# Patient Record
Sex: Female | Born: 1960
Health system: Southern US, Community
[De-identification: ages and names within clinical notes are randomized; demographics above are authoritative.]

## PROBLEM LIST (undated history)

## (undated) ENCOUNTER — Ambulatory Visit (HOSPITAL_COMMUNITY): Admission: EM | Payer: Medicaid Other | Source: Home / Self Care

## (undated) DIAGNOSIS — E7800 Pure hypercholesterolemia, unspecified: Secondary | ICD-10-CM

## (undated) DIAGNOSIS — R7303 Prediabetes: Secondary | ICD-10-CM

## (undated) DIAGNOSIS — K219 Gastro-esophageal reflux disease without esophagitis: Secondary | ICD-10-CM

## (undated) DIAGNOSIS — N62 Hypertrophy of breast: Secondary | ICD-10-CM

## (undated) DIAGNOSIS — I10 Essential (primary) hypertension: Secondary | ICD-10-CM

## (undated) DIAGNOSIS — S83249A Other tear of medial meniscus, current injury, unspecified knee, initial encounter: Secondary | ICD-10-CM

## (undated) DIAGNOSIS — M25549 Pain in joints of unspecified hand: Secondary | ICD-10-CM

## (undated) DIAGNOSIS — N898 Other specified noninflammatory disorders of vagina: Secondary | ICD-10-CM

## (undated) HISTORY — DX: Other specified noninflammatory disorders of vagina: N89.8

## (undated) HISTORY — PX: BREAST LUMPECTOMY: SHX2

## (undated) HISTORY — PX: VAGINAL HYSTERECTOMY: SUR661

## (undated) HISTORY — DX: Pain in joints of unspecified hand: M25.549

## (undated) HISTORY — DX: Pure hypercholesterolemia, unspecified: E78.00

## (undated) HISTORY — DX: Essential (primary) hypertension: I10

## (undated) SURGERY — COLONOSCOPY
Anesthesia: Monitor Anesthesia Care

---

## 2003-05-30 ENCOUNTER — Emergency Department (HOSPITAL_COMMUNITY): Admission: EM | Admit: 2003-05-30 | Discharge: 2003-05-30 | Payer: Self-pay | Admitting: Emergency Medicine

## 2003-11-13 ENCOUNTER — Emergency Department (HOSPITAL_COMMUNITY): Admission: EM | Admit: 2003-11-13 | Discharge: 2003-11-13 | Payer: Self-pay | Admitting: Emergency Medicine

## 2004-09-12 ENCOUNTER — Emergency Department (HOSPITAL_COMMUNITY): Admission: EM | Admit: 2004-09-12 | Discharge: 2004-09-13 | Payer: Self-pay | Admitting: Emergency Medicine

## 2004-10-05 ENCOUNTER — Ambulatory Visit: Payer: Self-pay | Admitting: Family Medicine

## 2004-10-13 ENCOUNTER — Ambulatory Visit: Payer: Self-pay | Admitting: Family Medicine

## 2004-10-17 ENCOUNTER — Ambulatory Visit: Payer: Self-pay | Admitting: *Deleted

## 2004-10-19 ENCOUNTER — Ambulatory Visit: Payer: Self-pay | Admitting: Family Medicine

## 2005-03-06 ENCOUNTER — Ambulatory Visit: Payer: Self-pay | Admitting: Family Medicine

## 2005-05-22 ENCOUNTER — Ambulatory Visit: Payer: Self-pay | Admitting: Family Medicine

## 2005-08-03 ENCOUNTER — Emergency Department (HOSPITAL_COMMUNITY): Admission: EM | Admit: 2005-08-03 | Discharge: 2005-08-03 | Payer: Self-pay | Admitting: Family Medicine

## 2005-08-09 ENCOUNTER — Emergency Department (HOSPITAL_COMMUNITY): Admission: EM | Admit: 2005-08-09 | Discharge: 2005-08-09 | Payer: Self-pay | Admitting: Family Medicine

## 2005-10-12 ENCOUNTER — Emergency Department (HOSPITAL_COMMUNITY): Admission: EM | Admit: 2005-10-12 | Discharge: 2005-10-12 | Payer: Self-pay | Admitting: Family Medicine

## 2005-11-25 ENCOUNTER — Emergency Department (HOSPITAL_COMMUNITY): Admission: EM | Admit: 2005-11-25 | Discharge: 2005-11-25 | Payer: Self-pay | Admitting: Family Medicine

## 2005-12-18 ENCOUNTER — Emergency Department (HOSPITAL_COMMUNITY): Admission: EM | Admit: 2005-12-18 | Discharge: 2005-12-18 | Payer: Self-pay | Admitting: Family Medicine

## 2006-02-01 ENCOUNTER — Emergency Department (HOSPITAL_COMMUNITY): Admission: EM | Admit: 2006-02-01 | Discharge: 2006-02-02 | Payer: Self-pay | Admitting: *Deleted

## 2006-02-04 ENCOUNTER — Inpatient Hospital Stay (HOSPITAL_COMMUNITY): Admission: AD | Admit: 2006-02-04 | Discharge: 2006-02-04 | Payer: Self-pay | Admitting: Obstetrics and Gynecology

## 2006-02-08 ENCOUNTER — Ambulatory Visit: Payer: Self-pay | Admitting: *Deleted

## 2006-03-08 ENCOUNTER — Ambulatory Visit (HOSPITAL_COMMUNITY): Admission: RE | Admit: 2006-03-08 | Discharge: 2006-03-08 | Payer: Self-pay | Admitting: Gastroenterology

## 2006-03-08 ENCOUNTER — Encounter (INDEPENDENT_AMBULATORY_CARE_PROVIDER_SITE_OTHER): Payer: Self-pay | Admitting: *Deleted

## 2007-09-02 ENCOUNTER — Encounter: Payer: Self-pay | Admitting: Internal Medicine

## 2008-02-06 DIAGNOSIS — E7800 Pure hypercholesterolemia, unspecified: Secondary | ICD-10-CM | POA: Insufficient documentation

## 2008-11-05 ENCOUNTER — Ambulatory Visit (HOSPITAL_COMMUNITY): Admission: RE | Admit: 2008-11-05 | Discharge: 2008-11-06 | Payer: Self-pay | Admitting: Obstetrics and Gynecology

## 2008-11-05 ENCOUNTER — Encounter (INDEPENDENT_AMBULATORY_CARE_PROVIDER_SITE_OTHER): Payer: Self-pay | Admitting: Obstetrics and Gynecology

## 2009-01-04 ENCOUNTER — Ambulatory Visit: Payer: Self-pay | Admitting: Family Medicine

## 2009-01-04 ENCOUNTER — Encounter (INDEPENDENT_AMBULATORY_CARE_PROVIDER_SITE_OTHER): Payer: Self-pay | Admitting: Nurse Practitioner

## 2009-01-04 DIAGNOSIS — D259 Leiomyoma of uterus, unspecified: Secondary | ICD-10-CM | POA: Insufficient documentation

## 2009-01-04 DIAGNOSIS — I10 Essential (primary) hypertension: Secondary | ICD-10-CM | POA: Insufficient documentation

## 2009-01-04 LAB — CONVERTED CEMR LAB
ALT: 18 units/L (ref 0–35)
Alkaline Phosphatase: 74 units/L (ref 39–117)
BUN: 20 mg/dL (ref 6–23)
Bilirubin Urine: NEGATIVE
Chloride: 103 meq/L (ref 96–112)
Eosinophils Relative: 2 % (ref 0–5)
Glucose, Urine, Semiquant: NEGATIVE
HCT: 42 % (ref 36.0–46.0)
Hemoglobin: 12.6 g/dL (ref 12.0–15.0)
Lymphocytes Relative: 26 % (ref 12–46)
Lymphs Abs: 1.9 10*3/uL (ref 0.7–4.0)
Monocytes Absolute: 0.4 10*3/uL (ref 0.1–1.0)
Monocytes Relative: 6 % (ref 3–12)
Platelets: 274 10*3/uL (ref 150–400)
Potassium: 3.5 meq/L (ref 3.5–5.3)
RBC: 4.61 M/uL (ref 3.87–5.11)
TSH: 0.742 microintl units/mL (ref 0.350–4.500)
Total Bilirubin: 0.3 mg/dL (ref 0.3–1.2)
Total Protein: 7.9 g/dL (ref 6.0–8.3)
WBC: 7.3 10*3/uL (ref 4.0–10.5)
pH: 6

## 2009-01-14 ENCOUNTER — Encounter (INDEPENDENT_AMBULATORY_CARE_PROVIDER_SITE_OTHER): Payer: Self-pay | Admitting: Nurse Practitioner

## 2009-02-02 ENCOUNTER — Ambulatory Visit: Payer: Self-pay | Admitting: Nurse Practitioner

## 2009-02-02 DIAGNOSIS — E669 Obesity, unspecified: Secondary | ICD-10-CM | POA: Insufficient documentation

## 2009-02-03 ENCOUNTER — Encounter (INDEPENDENT_AMBULATORY_CARE_PROVIDER_SITE_OTHER): Payer: Self-pay | Admitting: Nurse Practitioner

## 2009-02-11 ENCOUNTER — Ambulatory Visit: Payer: Self-pay | Admitting: Nurse Practitioner

## 2009-02-17 ENCOUNTER — Encounter (INDEPENDENT_AMBULATORY_CARE_PROVIDER_SITE_OTHER): Payer: Self-pay | Admitting: Nurse Practitioner

## 2009-02-17 DIAGNOSIS — I517 Cardiomegaly: Secondary | ICD-10-CM | POA: Insufficient documentation

## 2009-02-21 ENCOUNTER — Encounter (INDEPENDENT_AMBULATORY_CARE_PROVIDER_SITE_OTHER): Payer: Self-pay | Admitting: Nurse Practitioner

## 2009-03-11 ENCOUNTER — Telehealth (INDEPENDENT_AMBULATORY_CARE_PROVIDER_SITE_OTHER): Payer: Self-pay | Admitting: Nurse Practitioner

## 2009-03-18 ENCOUNTER — Telehealth (INDEPENDENT_AMBULATORY_CARE_PROVIDER_SITE_OTHER): Payer: Self-pay | Admitting: *Deleted

## 2009-04-04 ENCOUNTER — Ambulatory Visit: Payer: Self-pay | Admitting: Nurse Practitioner

## 2009-04-06 ENCOUNTER — Encounter (INDEPENDENT_AMBULATORY_CARE_PROVIDER_SITE_OTHER): Payer: Self-pay | Admitting: Nurse Practitioner

## 2009-04-06 LAB — CONVERTED CEMR LAB
LDL Cholesterol: 177 mg/dL — ABNORMAL HIGH (ref 0–99)
Triglycerides: 64 mg/dL (ref <150)
VLDL: 13 mg/dL (ref 0–40)

## 2009-04-20 ENCOUNTER — Emergency Department (HOSPITAL_COMMUNITY): Admission: EM | Admit: 2009-04-20 | Discharge: 2009-04-20 | Payer: Self-pay | Admitting: Emergency Medicine

## 2009-04-21 ENCOUNTER — Ambulatory Visit: Payer: Self-pay | Admitting: Nurse Practitioner

## 2009-04-21 DIAGNOSIS — M549 Dorsalgia, unspecified: Secondary | ICD-10-CM | POA: Insufficient documentation

## 2009-04-21 DIAGNOSIS — H1130 Conjunctival hemorrhage, unspecified eye: Secondary | ICD-10-CM | POA: Insufficient documentation

## 2009-04-21 LAB — CONVERTED CEMR LAB
HDL goal, serum: 40 mg/dL
LDL Goal: 160 mg/dL

## 2009-06-29 ENCOUNTER — Ambulatory Visit: Payer: Self-pay | Admitting: Nurse Practitioner

## 2009-06-29 DIAGNOSIS — B379 Candidiasis, unspecified: Secondary | ICD-10-CM | POA: Insufficient documentation

## 2009-06-29 LAB — CONVERTED CEMR LAB

## 2009-07-04 ENCOUNTER — Encounter (INDEPENDENT_AMBULATORY_CARE_PROVIDER_SITE_OTHER): Payer: Self-pay | Admitting: Nurse Practitioner

## 2009-07-04 LAB — CONVERTED CEMR LAB
Chlamydia, Swab/Urine, PCR: NEGATIVE
GC Probe Amp, Urine: NEGATIVE
HDL: 60 mg/dL (ref >39)
LDL Cholesterol: 175 mg/dL — ABNORMAL HIGH (ref 0–99)

## 2009-10-04 ENCOUNTER — Telehealth (INDEPENDENT_AMBULATORY_CARE_PROVIDER_SITE_OTHER): Payer: Self-pay | Admitting: Nurse Practitioner

## 2009-10-04 DIAGNOSIS — N760 Acute vaginitis: Secondary | ICD-10-CM | POA: Insufficient documentation

## 2009-10-13 ENCOUNTER — Encounter (INDEPENDENT_AMBULATORY_CARE_PROVIDER_SITE_OTHER): Payer: Self-pay | Admitting: Nurse Practitioner

## 2009-12-07 ENCOUNTER — Ambulatory Visit: Payer: Self-pay | Admitting: Obstetrics and Gynecology

## 2009-12-09 ENCOUNTER — Encounter: Payer: Self-pay | Admitting: Obstetrics and Gynecology

## 2009-12-09 LAB — CONVERTED CEMR LAB
Trich, Wet Prep: NONE SEEN
Yeast Wet Prep HPF POC: NONE SEEN

## 2010-04-27 ENCOUNTER — Ambulatory Visit: Payer: Self-pay | Admitting: Nurse Practitioner

## 2010-04-27 ENCOUNTER — Telehealth (INDEPENDENT_AMBULATORY_CARE_PROVIDER_SITE_OTHER): Payer: Self-pay | Admitting: Nurse Practitioner

## 2010-04-27 DIAGNOSIS — M25549 Pain in joints of unspecified hand: Secondary | ICD-10-CM | POA: Insufficient documentation

## 2010-05-11 ENCOUNTER — Telehealth (INDEPENDENT_AMBULATORY_CARE_PROVIDER_SITE_OTHER): Payer: Self-pay | Admitting: Nurse Practitioner

## 2010-05-12 ENCOUNTER — Ambulatory Visit (HOSPITAL_COMMUNITY): Admission: RE | Admit: 2010-05-12 | Discharge: 2010-05-12 | Payer: Self-pay | Admitting: Internal Medicine

## 2010-05-12 ENCOUNTER — Telehealth (INDEPENDENT_AMBULATORY_CARE_PROVIDER_SITE_OTHER): Payer: Self-pay | Admitting: Nurse Practitioner

## 2010-05-24 ENCOUNTER — Encounter (INDEPENDENT_AMBULATORY_CARE_PROVIDER_SITE_OTHER): Payer: Self-pay | Admitting: Nurse Practitioner

## 2010-11-02 ENCOUNTER — Telehealth (INDEPENDENT_AMBULATORY_CARE_PROVIDER_SITE_OTHER): Payer: Self-pay | Admitting: Nurse Practitioner

## 2010-11-05 LAB — CONVERTED CEMR LAB
Cholesterol: 242 mg/dL — ABNORMAL HIGH (ref 0–200)
HDL: 51 mg/dL (ref >39)
Total CHOL/HDL Ratio: 4.7
VLDL: 22 mg/dL (ref 0–40)

## 2010-11-07 NOTE — Letter (Signed)
Summary: *Referral Letter  [facility]  [address] [place], [place] [address]   Phone: [phone]  Fax: [phone]    10/13/2009  Thank you in advance for agreeing to see my patient:  Tara Holland [address] [place], [place]  [address]  Phone: [phone]  Reason for Referral: Recurrent vaginal itching and irritation   Current Medical Problems: 1)  UNSPECIFIED VAGINITIS AND VULVOVAGINITIS (ICD-616.10) 2)  CANDIDIASIS (ICD-112.9) 3)  BACK PAIN (ICD-724.5) 4)  HYPERCHOLESTEROLEMIA (ICD-272.0) 5)  VENTRICULAR HYPERTROPHY, LEFT (ICD-429.3) 6)  OBESITY (ICD-278.00) 7)  HYPERTENSION, BENIGN ESSENTIAL (ICD-401.1) 8)  Hx of FIBROIDS, UTERUS (ICD-218.9)   Current Medications: 1)  AMLODIPINE BESYLATE 10 MG TABS (AMLODIPINE BESYLATE) One tablet by mouth daily for blood pressure 2)  HYDROCHLOROTHIAZIDE 25 MG TABS (HYDROCHLOROTHIAZIDE) One tablet by mouth daily for blood pressure 3)  CALTRATE 600+D PLUS 600-400 MG-UNIT TABS (CALCIUM CARBONATE-VIT D-MIN)  4)  HYDROCORTISONE 2.5 % CREA (HYDROCORTISONE) One application topically two times a day as needed for itching 5)  SIMVASTATIN 20 MG TABS (SIMVASTATIN) One tablet by mouth nightly for cholesterol **Pharmacy - discontinue pravachol**    Thank you again for agreeing to see our patient; please contact us if you have any further questions or need additional information.  Sincerely,    [facility] Lehman Prom FNP

## 2010-11-07 NOTE — Progress Notes (Signed)
Summary: Additional Questions  Phone Note Call from Patient   Summary of Call: pt says she wants to do labs to check for blockage because she has pain in left arm and she wants to make sure she doesnt have any blockage in her arm plus she wants to start [employer] program and she wants to make sure she is okay...Marland KitchenMarland KitchenMarland Kitchen pt says it is time for mammogram Initial call taken by: Armenia Shannon,  April 27, 2010 2:38 PM  Follow-up for Phone Call        Mammogram ordered unsure what pt is asking regarding ? blockage test There is no test to screen for blockages - just preventative measures such as controlling cholesterol and blood pressure (for those that have it), stop smoking, exercise is good - start slow and increase over time. Of note, her cholesterol was high when done in 2010 and pt is likely NOT taking medication so this increases her risk. Follow-up by: Lehman Prom FNP,  April 27, 2010 3:20 PM  Additional Follow-up for Phone Call Additional follow up Details #1::        pt informed of above information. Additional Follow-up by: Levon Hedger,  April 27, 2010 4:14 PM  New Problems: OTHER SCREENING BREAST EXAMINATION (ICD-V76.19)   New Problems: OTHER SCREENING BREAST EXAMINATION (ICD-V76.19)

## 2010-11-07 NOTE — Letter (Signed)
Summary: TEST ORDER FROM//[facility]GRAM  TEST ORDER FROM//[facility]GRAM   Imported By: Arta Bruce 05/04/2010 14:26:58  _____________________________________________________________________  External Attachment:    Type:   Image     Comment:   External Document

## 2010-11-07 NOTE — Progress Notes (Signed)
Summary: do we have hand braces  Phone Note Call from Patient Call back at Home Phone [phone]   Complaint: Urinary/GYN Problems Summary of Call: Tara Holland wants to know if we have any hand braces, because the wrist splint isn't helping. she says that she cant afford to buy one. Initial call taken by: Leodis Rains,  May 11, 2010 8:52 AM  Follow-up for Phone Call        forward to N. Daphine Deutscher, fnp Follow-up by: Levon Hedger,  May 12, 2010 10:31 AM  Additional Follow-up for Phone Call Additional follow up Details #1::        unfortunately no, this office does not have any wrist splints. I looked when she was here during office visit.  To my knowledge we are not ordering any more so we will not have any in the future either Additional Follow-up by: Lehman Prom FNP,  May 12, 2010 10:45 AM    Additional Follow-up for Phone Call Additional follow up Details #2::    SPOKE WITH PATIENT AND SHE IS AWARE THAT WE DON'T HAVE ANY HAND BRACES. Follow-up by: Leodis Rains,  May 12, 2010 12:01 PM

## 2010-11-07 NOTE — Miscellaneous (Signed)
Summary: Colonscopy Order  Clinical Lists Changes  Problems: Added new problem of SCREENING, COLON CANCER (ICD-V76.51) Orders: Added new Test order of Colonoscopy (Colon) - Signed  Appended Document: Colonscopy Order    Clinical Lists Changes  Medications: Added new medication of MIRALAX  POWD (POLYETHYLENE GLYCOL 3350) Mix  8.3oz of powder with 64 ounces of water/juice for colonscopy prep - Signed Rx of MIRALAX  POWD (POLYETHYLENE GLYCOL 3350) Mix  8.3oz of powder with 64 ounces of water/juice for colonscopy prep;  #8.3oz x o;  Signed;  Entered by: Lehman Prom FNP;  Authorized by: Lehman Prom FNP;  Method used: Printed then faxed to [facility] [address], [place], [place], [place]  [address], Ph: [phone], Fax: [phone]    Prescriptions: MIRALAX  POWD (POLYETHYLENE GLYCOL 3350) Mix  8.3oz of powder with 64 ounces of water/juice for colonscopy prep  #8.3oz x o   Entered and Authorized by:   Lehman Prom FNP   Signed by:   Lehman Prom FNP on 06/27/2010   Method used:   Printed then faxed to ...       [facility] ([facility]ail)       [address]       [place], [place]  [address]       Ph: [phone]       Fax: [phone]   RxID:   [id number]

## 2010-11-07 NOTE — Assessment & Plan Note (Signed)
Summary: Pain in joint   Vital Signs:  Patient profile:   50 year old female Menstrual status:  hysterectomy Height:      68 inches Weight:      190.0 pounds BMI:     28.99 BSA:     2.00 Temp:     97.8 degrees F oral Pulse rate:   62 / minute Pulse rhythm:   regular Resp:     16 per minute BP sitting:   144 / 85  (left arm) Cuff size:   regular  Vitals Entered By: Levon Hedger (April 27, 2010 11:21 AM)  Nutrition Counseling: Patient's BMI is greater than 25 and therefore counseled on weight management options.  History of Present Illness:  Pt into the office for f/u with achy joints  Vaginal irritation - pt did go to GYN as per her request as she continued with vaginal discharge and some itching s/p her hysterectomy.  She was prescribed some cream but she did not get as ordered because she was not given a definate dx.  S/p hysterecomy about 1 year ago by [employer] ? that she is still having some tenderness and burning in the areas where   Hypertension History:      She denies headache, chest pain, and palpitations.  She notes no problems with any antihypertensive medication side effects.  Pt has only been taking amlodipine as she has to get HCTZ from [facility]. The Rx has been sent but pt has not gone to pick up the rx.  Admits that BP is improved when she takes both meds.        Positive major cardiovascular risk factors include hyperlipidemia and hypertension.  Negative major cardiovascular risk factors include female age less than 69 years old, no history of diabetes, and non-tobacco-user status.        Further assessment for target organ damage reveals no history of ASHD, cardiac end-organ damage (CHF/LVH), stroke/TIA, peripheral vascular disease, renal insufficiency, or hypertensive retinopathy.     Allergies: 1)  ! Sulfa  Review of Systems Resp:  Denies cough. GI:  Complains of change in bowel habits and constipation; denies abdominal pain, nausea, and vomiting; Last  colonscopy done about 3 years ago at which time pt reports some removal of polyps. Reports that stool is ribbon-like, Quanisha Drewry in color - no blood noted in stools.. MS:  Complains of joint pain; hands - stiffness especially in the morning.  Physical Exam  General:  alert.   Head:  normocephalic.   Lungs:  normal breath sounds.   Heart:  normal rate and regular rhythm.   Abdomen:  normal bowel sounds.   Neurologic:  alert & oriented X3.     Wrist/Hand Exam  Hand Exam:    Right:    Inspection:  Normal    Palpation:  Normal    Tenderness:  5th metacarpal    Swelling:  no    Erythema:  no   Impression & Recommendations:  Problem # 1:  JOINT PAIN, HAND (ICD-719.44)  ace wrap applied to hand advised pt that she likely has left thumb tendinitis she will benefit more from wrist splint  Orders: Ace  Bandage < 3in. [phone])  Problem # 2:  HYPERTENSION, BENIGN ESSENTIAL (ICD-401.1) bp elevated today but pt has not taken her HCTZ advised her to take meds daily Her updated medication list for this problem includes:    Amlodipine Besylate 10 Mg Tabs (Amlodipine besylate) ..... One tablet by mouth daily for blood pressure  Hydrochlorothiazide 25 Mg Tabs (Hydrochlorothiazide) ..... One tablet by mouth daily for blood pressure  Problem # 3:  HYPERCHOLESTEROLEMIA (ICD-272.0) lipids elevated on last visit pt is not taking meds as ordered advised of need to do so to decrease risk of heart attack/stroke Her updated medication list for this problem includes:    Simvastatin 20 Mg Tabs (Simvastatin) ..... One tablet by mouth nightly for cholesterol **pharmacy - discontinue pravachol**  Complete Medication List: 1)  Amlodipine Besylate 10 Mg Tabs (Amlodipine besylate) .... One tablet by mouth daily for blood pressure 2)  Hydrochlorothiazide 25 Mg Tabs (Hydrochlorothiazide) .... One tablet by mouth daily for blood pressure 3)  Caltrate 600+d Plus 600-400 Mg-unit Tabs (Calcium carbonate-vit  d-min) 4)  Hydrocortisone 2.5 % Crea (Hydrocortisone) .... One application topically two times a day as needed for itching 5)  Simvastatin 20 Mg Tabs (Simvastatin) .... One tablet by mouth nightly for cholesterol **pharmacy - discontinue pravachol**  Hypertension Assessment/Plan:      The patient's hypertensive risk group is category B: At least one risk factor (excluding diabetes) with no target organ damage.  Her calculated 10 year risk of coronary heart disease is 9 %.  Today's blood pressure is 144/85.  Her blood pressure goal is < 140/90.  Patient Instructions: 1)  Left thumb - likely tendinits from over use 2)  You will need a left small/medium wrist splint 3)  Ideally you would wear during the day but if you must work then wear it at night for at least 2 straight weeks. 4)  Take aleve - 2 tablets by mouth for three days after breakfast then as needed  5)  Bowel habits - be sure to add more fiber in your diet by eating Raisins, apples with peelings, oatmeal, raisin bran and drink plenty of water.   Colonscopy - unable to get referral at this time with [facility].  If opportunities open up this office can refer otherwise it will be an out of pocket expense 6)  Follow up as needed

## 2010-11-07 NOTE — Progress Notes (Signed)
Summary: REFILL ON BP MEDICATION  Phone Note Call from Patient Call back at Home Phone [phone]   Reason for Call: Refill Medication Summary of Call: [name]. MS Spayd SAYS SHE NEEDS A REFILL ON HER AMLODOPINE SENT TO [facility] [address]. Initial call taken by: Leodis Rains,  May 12, 2010 12:07 PM  Follow-up for Phone Call        forward to N. Daphine Deutscher, fnp Follow-up by: Levon Hedger,  May 12, 2010 2:47 PM  Additional Follow-up for Phone Call Additional follow up Details #1::        Rx sent to the pharmacy notify Tara Holland Additional Follow-up by: Lehman Prom FNP,  May 12, 2010 3:07 PM    Additional Follow-up for Phone Call Additional follow up Details #2::    Tara Holland notified.  Follow-up by: Levon Hedger,  May 12, 2010 4:52 PM  Prescriptions: AMLODIPINE BESYLATE 10 MG TABS (AMLODIPINE BESYLATE) One tablet by mouth daily for blood pressure  #30 x 5   Entered and Authorized by:   Lehman Prom FNP   Signed by:   Lehman Prom FNP on 05/12/2010   Method used:   Electronically to        [facility] (retail)       [address]       [place], [place]  [address]       Ph: [phone]       Fax: [phone]   RxID:   [id number]

## 2010-11-09 NOTE — Progress Notes (Signed)
Summary: refill on her HCTZ  Phone Note Call from Patient Call back at Home Phone [phone]   Reason for Call: Refill Medication Summary of Call: Tara Holland Bartmess called because she needs refills on her hctz 25 mg. she [facility], but now she wants it sent to [facility]. Holland Lannom says that she called [facility], and was told by them that they can have it ready for her tomorrow. Initial call taken by: Leodis Rains,  November 02, 2010 5:13 PM  Follow-up for Phone Call        Last seen 04/2010.  Refill HCTZ per protocol? Follow-up by: Dutch Quint RN,  November 02, 2010 5:17 PM  Additional Follow-up for Phone Call Additional follow up Details #1::        yes, ok to refill Additional Follow-up by: Lehman Prom FNP,  November 02, 2010 5:26 PM    Additional Follow-up for Phone Call Additional follow up Details #2::    Refill completed.  Pt. notified.  Dutch Quint RN  November 02, 2010 5:41 PM   Prescriptions: HYDROCHLOROTHIAZIDE 25 MG TABS (HYDROCHLOROTHIAZIDE) One tablet by mouth daily for blood pressure  #30 x 3   Entered by:   Dutch Quint RN   Authorized by:   Lehman Prom FNP   Signed by:   Dutch Quint RN on 11/02/2010   Method used:   Faxed to ...       [facility] (retail)       [address] [place], [place]  [address]       Ph: [phone]       Fax: [phone]   RxID:   [id number]

## 2010-11-14 ENCOUNTER — Encounter (INDEPENDENT_AMBULATORY_CARE_PROVIDER_SITE_OTHER): Payer: Self-pay | Admitting: Nurse Practitioner

## 2010-11-14 ENCOUNTER — Encounter: Payer: Self-pay | Admitting: Nurse Practitioner

## 2010-11-14 DIAGNOSIS — M940 Chondrocostal junction syndrome [Tietze]: Secondary | ICD-10-CM | POA: Insufficient documentation

## 2010-11-14 LAB — CONVERTED CEMR LAB
Nitrite: NEGATIVE
Protein, U semiquant: NEGATIVE
Urobilinogen, UA: 0.2
WBC Urine, dipstick: NEGATIVE

## 2010-11-15 ENCOUNTER — Encounter (INDEPENDENT_AMBULATORY_CARE_PROVIDER_SITE_OTHER): Payer: Self-pay | Admitting: Nurse Practitioner

## 2010-11-15 LAB — CONVERTED CEMR LAB
AST: 31 units/L (ref 0–37)
Alkaline Phosphatase: 76 units/L (ref 39–117)
BUN: 17 mg/dL (ref 6–23)
Basophils Relative: 0 % (ref 0–1)
Eosinophils Absolute: 0.1 10*3/uL (ref 0.0–0.7)
Eosinophils Relative: 2 % (ref 0–5)
Glucose, Bld: 86 mg/dL (ref 70–99)
HCT: 41.6 % (ref 36.0–46.0)
HDL: 62 mg/dL (ref >39)
LDL Cholesterol: 170 mg/dL — ABNORMAL HIGH (ref 0–99)
Lymphs Abs: 2.1 10*3/uL (ref 0.7–4.0)
MCHC: 31.7 g/dL (ref 30.0–36.0)
MCV: 90.4 fL (ref 78.0–100.0)
Neutrophils Relative %: 62 % (ref 43–77)
Platelets: 251 10*3/uL (ref 150–400)
RDW: 14 % (ref 11.5–15.5)
Sodium: 141 meq/L (ref 135–145)
TSH: 0.705 microintl units/mL (ref 0.350–4.500)
Total Bilirubin: 0.5 mg/dL (ref 0.3–1.2)
Total Protein: 7.4 g/dL (ref 6.0–8.3)
Triglycerides: 92 mg/dL (ref <150)
VLDL: 18 mg/dL (ref 0–40)
WBC: 6.9 10*3/uL (ref 4.0–10.5)

## 2010-11-23 NOTE — Letter (Signed)
Summary: Lipid Letter  [facility]  [address] [place], [place] [address]   Phone: [phone]  Fax: [phone]    11/15/2010  Tara Holland [address] [place], [place]  [address]  Dear Tara Holland:  We have carefully reviewed your last lipid profile from 11/14/2010 and the results are noted below with a summary of recommendations for lipid management.    Cholesterol:       250     Goal: less than 200   HDL "good" Cholesterol:   62     Goal: greater than 40   LDL "bad" Cholesterol:   170     Goal: less than 100   Triglycerides:       92     Goal: less than 150     Your cholesterol is still high. You should have spoken with this office about the need to start medication.  You will need your cholesterol and liver labs rechecked after 8 weeks of being on the medication.     Current Medications: 1)    Amlodipine Besylate 10 Mg Tabs (Amlodipine besylate) .... One tablet by mouth daily for blood pressure 2)    Hydrochlorothiazide 25 Mg Tabs (Hydrochlorothiazide) .... One tablet by mouth daily for blood pressure 3)    Caltrate 600+d Plus 600-400 Mg-unit Tabs (Calcium carbonate-vit d-min) 4)    Hydrocortisone 2.5 % Crea (Hydrocortisone) .... One application topically two times a day as needed for itching 5)    Simvastatin 20 Mg Tabs (Simvastatin) .... Hold 6)    Miralax  Powd (Polyethylene glycol 3350) .... Mix  8.3oz of powder with 64 ounces of water/juice for colonscopy prep 7)    Promethazine-codeine 6.25-10 Mg/3ml Syrp (Promethazine-codeine) .... 2 teaspoons at night as needed for cough 8)    Lovastatin 10 Mg Tabs (Lovastatin) .... One tablet by mouth nightly for cholesterol  If you have any questions, please call. We appreciate being able to work with you.   Sincerely,    [facility] Tara Prom FNP

## 2010-11-23 NOTE — Letter (Signed)
Summary: Handout Printed  Printed Handout:  - Costochondritis 

## 2010-11-23 NOTE — Assessment & Plan Note (Signed)
Summary: Costochondritis   Vital Signs:  Patient profile:   50 year old female Menstrual status:  hysterectomy Weight:      196.31 pounds Temp:     97.2 degrees F oral Pulse rate:   76 / minute Pulse rhythm:   regular Resp:     16 per minute BP sitting:   132 / 88  (left arm) Cuff size:   regular  Vitals Entered By: Hale Drone CMA (November 14, 2010 11:44 AM) CC: here for a f/u. Still feeling tightness in chest x2 weeks. Feels like "something is always pressing on chest." Also having lower abd. pain x6 months. Vagina itching for x3 months. Dr. Okey Dupre prescribed nystatin and triamcinoone acetonide cream but not helping., Hypertension Management, Lipid Management Is Patient Diabetic? No Pain Assessment Patient in pain? no       Does patient need assistance? Functional Status Self care Ambulation Normal   CC:  here for a f/u. Still feeling tightness in chest x2 weeks. Feels like "something is always pressing on chest." Also having lower abd. pain x6 months. Vagina itching for x3 months. Dr. Okey Dupre prescribed nystatin and triamcinoone acetonide cream but not helping., Hypertension Management, and Lipid Management.  History of Present Illness:  Pt into the office for f/u on chest tightness. She is fasting for labs.  Colonscopy - Last done about 3 years ago.  Polyp removed and she was indicated to have it repeated in 3 years.    S/p Hysterectomy - S/p robotic surgery in 2010.  Right ovary remains and pt has concerns about if that ovary is causing problems.  She reports that when she has a bowel movement then her lower abdomen has some pain  Obesity - "I have gained so much weight"  I am going to start waking tomorrow  Pt is fasting today for labs  Hypertension History:      She denies headache, chest pain, and palpitations.  She notes no problems with any antihypertensive medication side effects.        Positive major cardiovascular risk factors include hyperlipidemia and  hypertension.  Negative major cardiovascular risk factors include female age less than 49 years old, no history of diabetes, and non-tobacco-user status.        Further assessment for target organ damage reveals no history of ASHD, cardiac end-organ damage (CHF/LVH), stroke/TIA, peripheral vascular disease, renal insufficiency, or hypertensive retinopathy.    Lipid Management History:      Positive NCEP/ATP III risk factors include hypertension.  Negative NCEP/ATP III risk factors include female age less than 70 years old, non-diabetic, HDL cholesterol greater than 60, non-tobacco-user status, no ASHD (atherosclerotic heart disease), no prior stroke/TIA, no peripheral vascular disease, and no history of aortic aneurysm.        The patient states that she knows about the "Therapeutic Lifestyle Change" diet.  Her compliance with the TLC diet is good.  The patient does not know about adjunctive measures for cholesterol lowering.  She expresses no side effects from her lipid-lowering medication.  The patient denies any symptoms to suggest myopathy or liver disease.      Habits & Providers  Alcohol-Tobacco-Diet     Alcohol drinks/day: 0     Tobacco Status: never  Exercise-Depression-Behavior     Does Patient Exercise: yes     Exercise Counseling: to improve exercise regimen     Type of exercise: walking     Exercise (avg: min/session): <30     Times/week: 3  Drug Use: never     Seat Belt Use: always  Current Medications (verified): 1)  Amlodipine Besylate 10 Mg Tabs (Amlodipine Besylate) .... One Tablet By Mouth Daily For Blood Pressure 2)  Hydrochlorothiazide 25 Mg Tabs (Hydrochlorothiazide) .... One Tablet By Mouth Daily For Blood Pressure 3)  Caltrate 600+d Plus 600-400 Mg-Unit Tabs (Calcium Carbonate-Vit D-Min) 4)  Hydrocortisone 2.5 % Crea (Hydrocortisone) .... One Application Topically Two Times A Day As Needed For Itching 5)  Simvastatin 20 Mg Tabs (Simvastatin) .... One Tablet By  Mouth Nightly For Cholesterol **pharmacy - Discontinue Pravachol** 6)  Miralax  Powd (Polyethylene Glycol 3350) .... Mix  8.3oz of Powder With 64 Ounces of Water/juice For Colonscopy Prep  Allergies (verified): 1)  ! Sulfa  Review of Systems General:  Denies fever. CV:  Complains of chest pain or discomfort; started 2 weeks ago.  Just constant.Marland Kitchen Resp:  Denies cough. GI:  Denies abdominal pain, nausea, and vomiting. MS:  Denies joint pain.  Physical Exam  General:  alert.   Head:  normocephalic.   Ears:  ear piercing(s) noted.   Nose:  inflammed turbinate Chest Wall:  midsternal tenderness with palpation Lungs:  normal breath sounds.   Heart:  normal rate and regular rhythm.     Impression & Recommendations:  Problem # 1:  HYPERTENSION, BENIGN ESSENTIAL (ICD-401.1) BP is doing well DASH diet pt has gained weight, advise that she should start exercise Her updated medication list for this problem includes:    Amlodipine Besylate 10 Mg Tabs (Amlodipine besylate) ..... One tablet by mouth daily for blood pressure    Hydrochlorothiazide 25 Mg Tabs (Hydrochlorothiazide) ..... One tablet by mouth daily for blood pressure  Orders: UA Dipstick w/o Micro (automated)  (81003)  Problem # 2:  HYPERCHOLESTEROLEMIA (ICD-272.0) will check labs today Pt is not taking any meds -stopped due to side effects Her updated medication list for this problem includes:    Simvastatin 20 Mg Tabs (Simvastatin) ..... Hold  Orders: T-Lipid Profile [phone]) T-Comprehensive Metabolic Panel [phone]) T-CBC w/Diff [phone]) T-HIV Antibody  (Reflex) [id number]) T-Syphilis Test (RPR) [phone])  Problem # 3:  COSTOCHONDRITIS (ICD-733.6) handout given Her updated medication list for this problem includes:    Caltrate 600+d Plus 600-400 Mg-unit Tabs (Calcium carbonate-vit d-min)  Problem # 4:  OBESITY (ICD-278.00) advised that pt she needs to increase her exercise Orders: T-TSH  ([id number])  Problem # 5:  SCREENING, COLON CANCER (ICD-V76.51) pt will call to check on her balance owed at [facility]  Problem # 6:  Hx of FIBROIDS, UTERUS (ICD-218.9) s/p robotic surgery - ? about remaining ovary ultrasound done last year - ok   Complete Medication List: 1)  Amlodipine Besylate 10 Mg Tabs (Amlodipine besylate) .... One tablet by mouth daily for blood pressure 2)  Hydrochlorothiazide 25 Mg Tabs (Hydrochlorothiazide) .... One tablet by mouth daily for blood pressure 3)  Caltrate 600+d Plus 600-400 Mg-unit Tabs (Calcium carbonate-vit d-min) 4)  Hydrocortisone 2.5 % Crea (Hydrocortisone) .... One application topically two times a day as needed for itching 5)  Simvastatin 20 Mg Tabs (Simvastatin) .... Hold 6)  Miralax Powd (Polyethylene glycol 3350) .... Mix  8.3oz of powder with 64 ounces of water/juice for colonscopy prep 7)  Promethazine-codeine 6.25-10 Mg/30ml Syrp (Promethazine-codeine) .... 2 teaspoons at night as needed for cough  Hypertension Assessment/Plan:      The patient's hypertensive risk group is category B: At least one risk factor (excluding diabetes) with no target organ damage.  Her calculated 10  year risk of coronary heart disease is 7 %.  Today's blood pressure is 132/88.  Her blood pressure goal is < 140/90.  Lipid Assessment/Plan:      Based on NCEP/ATP III, the patient's risk factor category is "0-1 risk factors".  The patient's lipid goals are as follows: Total cholesterol goal is 200; LDL cholesterol goal is 160; HDL cholesterol goal is 40; Triglyceride goal is 150.    Patient Instructions: 1)  You have to do your best to keep your cholesterol and blood pressure at normal limits.   2)  Your labs will be checked today and you will be informed of the results. 3)  Cough - Most likely the cause of your chest pain. 4)  Take the aleve - 2 tablets by mouth daily for the next 3 days (samples given)  You can also take cough syrup at night as needed for cough.   This will make you sleepy. 5)  Cholesterol - your labs will be checked today. 6)  Follow up as needed. Prescriptions: PROMETHAZINE-CODEINE 6.25-10 MG/5ML SYRP (PROMETHAZINE-CODEINE) 2 teaspoons at night as needed for cough  #4 ounces x 0   Entered and Authorized by:   Lehman Prom FNP   Signed by:   Lehman Prom FNP on 11/14/2010   Method used:   Print then Give to Patient   RxID:   [id number]    Orders Added: 1)  UA Dipstick w/o Micro (automated)  [81003] 2)  T-Lipid Profile [[id number]] 3)  T-Comprehensive Metabolic Panel [[id number]] 4)  T-CBC w/Diff [[id number]] 5)  T-HIV Antibody  (Reflex) [[id number]] 6)  T-Syphilis Test (RPR) [[id number]] 7)  T-TSH [[id number]] 8)  Est. Patient Level IV [95188]   Not Administered:    Influenza Vaccine not given due to: declined   Laboratory Results   Urine Tests  Date/Time Received: November 14, 2010 12:03 PM   Routine Urinalysis   Color: lt. yellow Appearance: Clear Glucose: negative   (Normal Range: Negative) Bilirubin: negative   (Normal Range: Negative) Ketone: negative   (Normal Range: Negative) Spec. Gravity: 1.015   (Normal Range: 1.003-1.035) Blood: negative   (Normal Range: Negative) pH: 6.0   (Normal Range: 5.0-8.0) Protein: negative   (Normal Range: Negative) Urobilinogen: 0.2   (Normal Range: 0-1) Nitrite: negative   (Normal Range: Negative) Leukocyte Esterace: negative   (Normal Range: Negative)         Appended Document: Tdap    Nurse Visit   Allergies: 1)  ! Sulfa  Immunizations Administered:  Tetanus Vaccine:    Vaccine Type: Tdap    Site: right deltoid    Mfr: Sanofi Pasteur    Dose: 0.5 ml    Route: IM    Given by: Hale Drone CMA    Exp. Date: 02/14/2013    Lot #: [id number]    VIS given: 08/25/08 version given November 14, 2010.  Orders Added: 1)  Tdap => 15yrs IM [90715] 2)  Admin 1st Vaccine [01601]

## 2011-01-22 LAB — BASIC METABOLIC PANEL
BUN: 9 mg/dL (ref 6–23)
Chloride: 103 mEq/L (ref 96–112)
Glucose, Bld: 116 mg/dL — ABNORMAL HIGH (ref 70–99)
Potassium: 4 mEq/L (ref 3.5–5.1)

## 2011-01-22 LAB — CBC
HCT: 36.2 % (ref 36.0–46.0)
MCHC: 32.5 g/dL (ref 30.0–36.0)
MCV: 91.5 fL (ref 78.0–100.0)
Platelets: 215 10*3/uL (ref 150–400)
Platelets: 252 10*3/uL (ref 150–400)
RDW: 13 % (ref 11.5–15.5)
RDW: 13.1 % (ref 11.5–15.5)

## 2011-02-20 NOTE — Op Note (Signed)
NAMECARLEENA, Tara Holland               ACCOUNT NO.:  [id number]   MEDICAL RECORD NO.:  [id number]          PATIENT TYPE:  OIB   LOCATION:  1522                         [facility]AC[facility]   PHYSICIAN:  Crist Fat. Rivard, M.D. DATE OF BIRTH:  1961-02-25   DATE OF PROCEDURE:  11/05/2008  DATE OF DISCHARGE:                               OPERATIVE REPORT   PREOPERATIVE DIAGNOSIS:  Symptomatic fibroids with menorrhagia.   POSTOPERATIVE DIAGNOSIS:  Symptomatic fibroids with menorrhagia with  pelvic adhesions and abnormal-looking left ovary.   ANESTHESIA:  General, Dr. Raymon Mutton.   PROCEDURE:  Robotic hysterectomy with lysis of adhesions and left  salpingo-oophorectomy.   SURGEON:  Dr. Estanislado Pandy.   ASSISTANT:  Henreitta Leber, physician's assistant.   ESTIMATED BLOOD LOSS:  50 mL.   PROCEDURE:  After being informed of the planned procedure with possible  complications including bleeding, infection, injury to bowel, bladder or  ureters, expected hospital stay and postoperative recovery, informed  consent is obtained.  The patient is taken to OR #10 and given general  anesthesia with endotracheal intubation without any complication.  She  is placed in the lithotomy position on a sticky mattress, arms padded  and tucked, knee high sequential compressive devices.  She is prepped  and draped in a sterile fashion.  Pelvic exam reveals an anteverted  uterus with a dominant anterior fibroid approximately 14 weeks in size  but mobile.  Adnexa are not felt.   A weighted speculum is inserted, and the cervix is grasped with a  tenaculum forceps.  The uterus is then sounded at 8 cm, and the cervix  is easily dilated using Pratt dilator until #33 which allows easy entry  of a #8 Rumi intrauterine manipulator.  Its balloon is inflated with 8  mL of saline.  This is also with a #3 KOH ring and a vaginal occluder.  The KOH ring is sutured to the cervix with 2 sutures of 0 Vicryl, and  the tenaculum is replaced for  a Jacobs forceps.  The weighted speculum  is removed, and a Foley catheter is inserted in her bladder.   We measure 12 cm above the fundus of the uterus which ends up being  approximately 4 cm above the umbilicus, and we infiltrate this area with  5 mL of Marcaine 0.25, and we perform a 10-mm vertical incision.  That  incision is brought down sharply to the fascia which is recognized and  grasped with Kocher forceps and incised with Mayo scissors.  Peritoneum  is entered bluntly.  A pursestring suture of 0 Vicryl is placed on the  fascia, and a 10-mm Hassan trocar is entered in the abdominal cavity and  held in place with a pursestring suture.  This allows for easy  insufflation of the pneumoperitoneum with CO2 at a maximum pressure of  15 mmHg.  The camera is inserted.   Observation:  Anterior cul-de-sac is normal.  Uterus is enlarged with a  dominant fundal fibroid measuring approximately 8 cm.  There is also a  posterior fibroid measuring 4 cm and a posterior lower uterine fibroid  measuring 4 cm.  The posterior cul-de-sac is partially obliterated with  fine-looking adhesions.  The right tube is normal.  Right ovary is  normal.  Left tube is normal, but the left ovary has an abnormal  appearance with the major ovarian tissue linked to a secondary area of  ovarian tissue, and these 2 parts are connected with the deep fibrous  bands.  All of this is also adherent to the left pelvic wall into the  posterior cul-de-sac.  The appendix is visualized and normal.  The  gallbladder and the liver are visualized and normal.   We then determine placement of our remaining trocars, and we place two 8-  mm robotic trocars on the left side, one 8-mm robotic trocar on the  right, and one 10-mm patient side assistant trocar on the right, all  under direct visualization with infiltration with Marcaine 0.25.   The robot was docked on the left patient side, and we insert a monopolar  scissor in arm #1,  a gyrus PK forceps in arm #2, and a Cobra forceps in  arm #3.  Preparation and docking is completed in 45 minutes.   We decide to start with the left side of the patient trying to free this  ovary, and we proceed with a sharp systematic dissection of the ovary  away from the pelvic wall, keeping the ureter under direct  visualization.  That secondary ovarian tissue is then freed from the  posterior cul-de-sac, again with sharp and blunt dissection.  Once all  of this freed, we can proceed with cauterization of the  infundibulopelvic ligament with a gyrus forceps, keeping the ureter  under direct visualization and section that ligament.  We also  cauterized and sectioned the utero-ovarian ligament to free the adnexa  completely with the tube and deposited in the posterior cul-de-sac.  The  round ligament is then cauterized and sectioned which gives [place] entry  into the broad ligament.  We proceed with opening of the anterior sheath  of the broad ligament up into the anterior cul-de-sac to sharply and  bluntly dissect bladder away from the vaginal fornix, easily identified  with the KOH ring.  The posterior sheath of the broad ligament is also  dissected down in order to skeletonize the uterine artery.  The left  ureter is easily kept under direct visualization during that process.  We now have completely skeletonized vascular bundle, and we then proceed  with cauterization of the uterine artery in its ascending branch at the  level of the KOH ring.  Moving to the right, we cauterize and section  the tube, cauterize and section the utero-ovarian ligament, cauterize  and section the round ligament which gives [place] entry into the broad  ligament.  The anterior sheath of the broad ligament is dissected  sharply all the way to the anterior cul-de-sac which now allows [place] to  complete our bladder dissection away from the vaginal fornix identified  by the KOH ring.  The posterior sheath of the broad  ligament is also  descended sharply keeping the ureter under direct visualization, and  this allows [place] to completely skeletonize the vascular bundle again.  That bundle is then cauterized with gyrus PK forceps.  In order to  better visualize the posterior aspect of the KOH ring, we have to  completely take down all the pelvic adhesions in that area, and now we  can visualize the KOH ring.  Because the uterus is mainly detached, it  is difficult to maintain control due to the weight of the fibroid, and  decision is made to proceed with myomectomy, and so the large 8-cm  fundal fibroid is dissected away from the uterus.  That is deposited in  the of the upper abdomen and gives [place] much better control of the uterus  so we can proceed without colpotomy.  The vaginal occluder is inflated,  and using an open monopolar scissor, we proceed with an anterior  colpotomy on the KOH ring, and we complete that colpotomy 360 degrees on  the posterior aspect, freeing the uterus completely.  The uterus is  easily delivered through the vagina.  Moving away from the console and  taking position between the patient's leg, I am able to identify the  large fibroid at the vaginal apex which was pushed towards me by my  assistant.  With a weighted speculum in place, a tenaculum is used to  grasp the fibroid, and we deliver it intact through the vagina.  The  left ovary in its tube are also identified, grasped and delivered  through the vagina.   Moving back to console time, instruments are switched for a suture cut  in arm #1, a needle holder in arm #2, and PK forceps in arm #3.  We  proceed with closure of the vaginal apex using figure-of-eight stitches  of 0 Vicryl.  At the end of the closure, hemostasis is adequate.   We irrigate the pelvis profusely and note satisfactory hemostasis on all  our pedicles including the left infundibulopelvic ligament.  A sheet of  intercede is deposited in the cul-de-sac to  avoid future formation of  lysis of adhesions.   The robot is undocked.  Console time is 2 hours and 30 minutes.  The  trocars are then removed under direct visualization after evacuating the  pneumoperitoneum.  The patient is repositioned flat, hips flexed.  The  fascia of the supraumbilical incision is closed with the previously  placed pursestring suture of 0 Vicryl.  The fascia of the 10-mm right  side trocar is closed with a figure-of-eight stitch of 0 Vicryl.  The  skin of all incision is closed with subcuticular suture of 3-0 Monocryl  and Dermabond.   Vaginal inspection reveals a slight oozing in the left aspect of the  incision, and a superficial figure-of-eight stitch of 3-0 Vicryl is  placed vaginally for complete hemostasis.   Instrument and sponge count is complete x2.  Estimated blood loss is 50  mL.  The procedure is very well tolerated by the patient who is taken to  recovery room in a well and stable condition.   SPECIMEN:  Uterus, left ovary and left tube weighed 322 g, sent to  pathology.      Crist Fat Rivard, M.D.  Electronically Signed     SAR/MEDQ  D:  11/05/2008  T:  11/06/2008  Job:  [id number]

## 2011-02-20 NOTE — H&P (Signed)
NAMESAHAR, RYBACK               ACCOUNT NO.:  [id number]   MEDICAL RECORD NO.:  [id number]          PATIENT TYPE:  AMB   LOCATION:  DAY                          FACILITY:  [facility]   PHYSICIAN:  Crist Fat. Rivard, M.D. DATE OF BIRTH:  Sep 07, 1961   DATE OF ADMISSION:  DATE OF DISCHARGE:                              HISTORY & PHYSICAL   HISTORY OF PRESENT ILLNESS:  Ms. Bangert is a 50 year old, single,  African American female, gravida 0, presenting for robotically assisted  hysterectomy because of symptomatic uterine fibroids and menorrhagia.  The patient has a long history of uterine fibroids, which in the past  have caused significant pelvic pain; however, the patient was relatively  asymptomatic until a year ago when she oligomenorrhea for 2 months;  however, that episode was followed by heavy bleeding in November 2009.  The patient reports that at that time, she had a tremendous 7-day flow,  during which time she changed a super plus tampon with a pad seven times  daily, accompanied by large clots.  She denied any cramping and  responded well to Femcon oral contraceptive taper.  Additionally, the  patient reports that she has significant deep dyspareunia, but she  denies any urinary tract symptoms, changes in her bowel habits or  vaginitis symptoms.  A pelvic ultrasound in December 2009, done by Dr.  Marcelle Overlie, showed a uterus measuring 8.5 x 4.5 x 6.6 cm with 4 measurable  fibroids being (at their largest diameter) 6.8 cm, 4.3 cm, 2.9 cm and  1.6 cm.  The patient's right ovary measured 3.2 x 1.6 x 2.0 cm and left  ovary measured 2.77 x 1.85 x 2.04 cm with scattered calcifications.  The  patient, since she has concluded that she does not desire to bear  children, that she would like to proceed with a minimally invasive  hysterectomy using robotic assistance.   PAST MEDICAL HISTORY:   OB HISTORY:  Gravida zero.   GYN HISTORY:  Menarche 50-1/50 years old.  The patient's last  menstrual  period was October 15, 2008.  The patient is currently taking Femcon for  menses management.  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  She denies any  history of abnormal Pap smears. Her last normal Pap smear was November  2009.  Last normal mammogram August 2009.   MEDICAL HISTORY:  1. Hypertension.  2. Pancreatitis.  3. Gastroesophageal reflux disease.  4. Hyperlipidemia.   SURGICAL HISTORY:  1998 left breast lumpectomy (fibroadenoma).  She  denies any problems with anesthesia or history of blood transfusions.   FAMILY HISTORY:  Cardiovascular disease, thyroid disease, colon cancer,  hypertension, stroke and renal failure.   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   HABITS:  She does not use tobacco, alcohol or illicit drugs.   CURRENT MEDICATIONS:  1. Lisinopril 20 mg daily  2. Norvasc 10 mg daily.  3. Hydrochlorothiazide 25 mg daily.   ALLERGIES:  The patient is allergic to  SULFA which causes tremendous  pruritus.  She denies any sensitivity or allergy to latex or to  shellfish.   REVIEW OF SYSTEMS:  The patient denies any chest pain, shortness of  breath, cough, headache, vision changes, nausea, vomiting, diarrhea,  joint pain, skin rashes, pelvic pain for dysmenorrhea and except as is  mentioned in history present illness, the patient's review of systems is  otherwise negative.   PHYSICAL EXAM:  VITAL SIGNS:  Blood pressure is 130/86, pulse 80,  respiration 16, temperature 98.6 degrees Fahrenheit orally, weight 189,  height 5 feet 9 inches tall.  Body mass index 27.9.  GENERAL:  Neck is supple without masses.  There is no thyromegaly or  cervical adenopathy.  HEART:  Regular rate and rhythm.  There is no murmur.  LUNGS:  Clear.  BACK:  No CVA tenderness.  ABDOMEN:  Is without tenderness, guarding or organomegaly though there  is a  firm mass arising from the pelvis palpated midway between the  umbilicus and the symphysis pubis.  EXTREMITIES:  Without clubbing, cyanosis or edema.  PELVIC:  EG, BUS are normal.  Vagina is rugose.  Cervix is nontender  without lesions.  Uterus appears 16-18 weeks' size, is anterior and  mobile.  Adnexa without tenderness or separable masses.   IMPRESSION:  1. Symptomatic uterine fibroids.  2. Menorrhagia.   DISPOSITION:  A discussion was held with the patient regarding the  indications for her procedure along with its risks, which include but  are not limited to reaction to anesthesia, damage to adjacent organs,  infection, excessive bleeding, transient postoperative facial edema, the  possibility that her procedure may have to be converted to an open  abdominal procedure to complete her surgery safely.  The patient  verbalized understanding of these risks and wishes to proceed with a  robot assisted hysterectomy at [facility] November 05, 2008.  She was given a MiraLax bowel prep to be completed 24 hours  prior to her procedure.      Elmira J. Adline Peals.      Crist Fat Rivard, M.D.  Electronically Signed    EJP/MEDQ  D:  11/02/2008  T:  11/02/2008  Job:  [id number]

## 2011-02-23 NOTE — Op Note (Signed)
Tara Holland, Tara Holland               ACCOUNT NO.:  [id number]   MEDICAL RECORD NO.:  [id number]          PATIENT TYPE:  WOC   LOCATION:  WOC                          FACILITY:  [facility]   PHYSICIAN:  Anselmo Rod, M.D.  DATE OF BIRTH:  1961-03-01   DATE OF PROCEDURE:  03/08/2006  DATE OF DISCHARGE:                                 OPERATIVE REPORT   PROCEDURE:  Screening colonoscopy with snare polypectomy x2.   ENDOSCOPIST:  Anselmo Rod, M.D.   INSTRUMENT USED:  Olympus video colonoscope.   INDICATIONS FOR PROCEDURE:  This 50 year old African/American female with a  history of change in bowel habits, undergoing a screening colonoscopy.  Rule  out colonic polyps, masses, etc.  The patient has had some pencil-thin  stools in the recent past with abdominal pain.   PRE-PROCEDURE PREPARATION:  An informed consent was procured from the  patient.  The patient was fasted for four hours prior to the procedure after  being prepped with Osmo prep on the morning of the procedure.  The risks and  benefits of the procedure including a 10% miss-rate of cancer and polyps  were discussed with the patient as well.   PRE-PROCEDURE PHYSICAL EXAMINATION:  VITAL SIGNS:  Stable.  NECK:  Supple.  CHEST:  Clear to auscultation.  HEART:  S1, S2 regular.  ABDOMEN:  Soft, with normal bowel sounds.   DESCRIPTION OF PROCEDURE:  The patient was placed in the left lateral  decubitus position and sedated with 100 mcg of fentanyl and 10 mg of Versed  in slow incremental doses.  Once the patient was adequately sedated and  maintained on low-flow oxygen and continuous cardiac monitoring, the Olympus  video colonoscope was advanced from the rectum to the cecum.  A small  sessile cecal polyp was removed by a hard snare (settings of 150/15).  The  appendiceal orifice and ileocecal valve were clearly visualized and a small  sessile polyp  removed by hot snare as well, from 60 cm (settings of  200/20).   Retroflexion in the rectum revealed internal hemorrhoids.  The  rest of the examination was unremarkable.  There was no evidence of  diverticulosis. The patient tolerated the procedure well without any  complications.   IMPRESSION:  1.  Internal hemorrhoids seen on retroflexion.  2.  Small sessile polyp removed by a hot snare from the cecum.  This polyp      was lost in the stool.  3.  Another small sessile polyp snared from 60 cm.  This was recovered.   RECOMMENDATIONS:  1.  Await pathology results.  2.  Avoid all non-steroidals and aspirin for the next two weeks.  3.  Outpatient followup in the next two weeks with further recommendations.      Anselmo Rod, M.D.  Electronically Signed     JNM/MEDQ  D:  03/08/2006  T:  03/08/2006  Job:  [id number]   cc:   Naima A. Normand Sloop, M.D.  Fax: [phone]

## 2011-02-23 NOTE — Group Therapy Note (Signed)
[name], Tara Holland NO.:  [id number]   MEDICAL RECORD NO.:  [id number]          PATIENT TYPE:  WOC   LOCATION:  [facility]                   FACILITY:  [facility]   PHYSICIAN:  Ellis Parents, MD    DATE OF BIRTH:  21-Apr-1961   DATE OF SERVICE:                                    CLINIC NOTE   CLINICAL HISTORY:  This 50 year old nulliparous female comes in complaining  of pelvic pain of approximately 7 to 10 days duration.  The patient was well  up until about two weeks ago when she started developing lower abdominal  pain intermittently and of mild nature.  It has gradually progressed and in  the past 48 to 72 hours it has been very severe.  The patient's menses are  regular with LMP of January 25, 2006.  The patient is nulliparous and is not  on oral contraceptives.  She had unprotected intercourse approximately two  weeks ago.  The patient has had some urinary frequency but no significant  dysuria.  She has no back pain.  She denies any fever.   The patient was hospitalized at [facility] on March 6 through 8 at  which no definitive diagnosis was made, although on ultrasound a hemorrhagic  ovarian cyst was found.  At that time, a GC and Chlamydia probe was  negative.  The patient has continued to have pain and comes in today for  evaluation.  In addition, it is noted that her blood pressure today was  190/118 with a large cuff repeated.  She is on antihypertensive medications,  lisinopril 40 mg and HCTZ 25 mg a day and under the care of family practice  physician in [place].   PHYSICAL EXAMINATION:  External genitalia are normal.  The vagina is clean.  The cervix is well epithelialized.  Bimanual exam reveals the uterus to be  boggy and about 10 weeks size but soft and extremely tender.  Both adnexae  are soft with not any significant adnexal tenderness.   IMPRESSION:  It appears that this could represent endomyometritis.  The  patient is treated with  Rocephin 250 mg IM, doxycycline 100 mg b.i.d. for 14  days and metronidazole 500 mg b.i.d. for 14 days.  A repeat GC and Chlamydia  probe is performed.  The patient is advised to return on Monday, in three  days, if she is not somewhat improved.  If she improved, she is to return in  two weeks for follow-up.           ______________________________  Ellis Parents, MD     SA/MEDQ  D:  02/08/2006  T:  02/09/2006  Job:  [id number]

## 2011-12-21 ENCOUNTER — Other Ambulatory Visit (HOSPITAL_COMMUNITY): Payer: Self-pay | Admitting: Internal Medicine

## 2011-12-21 DIAGNOSIS — R102 Pelvic and perineal pain: Secondary | ICD-10-CM

## 2011-12-28 ENCOUNTER — Inpatient Hospital Stay (HOSPITAL_COMMUNITY): Admission: RE | Admit: 2011-12-28 | Payer: Self-pay | Source: Ambulatory Visit

## 2012-01-22 ENCOUNTER — Institutional Professional Consult (permissible substitution): Payer: Self-pay | Admitting: Cardiovascular Disease

## 2012-01-25 ENCOUNTER — Institutional Professional Consult (permissible substitution): Payer: Self-pay | Admitting: Cardiovascular Disease

## 2012-02-22 ENCOUNTER — Encounter: Payer: Self-pay | Admitting: Cardiovascular Disease

## 2012-02-22 ENCOUNTER — Ambulatory Visit (INDEPENDENT_AMBULATORY_CARE_PROVIDER_SITE_OTHER): Payer: Self-pay | Admitting: Cardiovascular Disease

## 2012-02-22 VITALS — BP 150/99 | HR 66 | Ht 70.0 in | Wt 198.0 lb

## 2012-02-22 DIAGNOSIS — I10 Essential (primary) hypertension: Secondary | ICD-10-CM

## 2012-02-22 LAB — BASIC METABOLIC PANEL
Chloride: 104 mEq/L (ref 96–112)
Creat: 1.19 mg/dL — ABNORMAL HIGH (ref 0.50–1.10)
Potassium: 3.5 mEq/L (ref 3.5–5.3)
Sodium: 141 mEq/L (ref 135–145)

## 2012-02-22 MED ORDER — POTASSIUM CHLORIDE CRYS ER 10 MEQ PO TBCR: 10.0000 meq | EXTENDED_RELEASE_TABLET | Freq: Every day | ORAL | Status: DC

## 2012-02-22 MED ORDER — HYDROCHLOROTHIAZIDE 25 MG PO TABS: 25.0000 mg | ORAL_TABLET | Freq: Every day | ORAL | Status: DC

## 2012-02-22 NOTE — Assessment & Plan Note (Signed)
Tara Holland is doing fairly well. Blood pressure is mildly elevated. I would like to add her HCTZ back in. We talked about the long-term complications of having high blood pressure. We'll check a basic metabolic profile today to make sure that her potassium is okay. We'll add potassium chloride 10 mEq a day. We will continue with her amlodipine.  Her episodes of chest pain are very atypical and I don't think that they need any further evaluation she's able to work 8 hours a day without any angina. Her EKG reveals minimal left ventricular hypertrophy which does not indicate any presence of coronary artery disease.  A\I have asked her to start a regular exercise program. Hif she is not  able to exercise on a regular basis she'll call and we can arrange for further evaluation as needed. I'll see her again in 3 months for an office visit and fasting labs.

## 2012-02-22 NOTE — Progress Notes (Signed)
    Tara Holland Date of Birth  July 28, 1961       [facility]    [place] [address]  [address], [address] [place], [place]  [address]   [place], [place]  [address] [phone]     [phone]   Fax  [phone]    Fax [phone]  Problem List: 1. Hypertension 2. Chest pain  History of Present Illness:  Tara Holland is a 51 year old female with history of chest pains and hypertension the past. She's had a negative adenosine Myoview study in the past.  She has a strong family history of hypertension and also has some left ventricular hypertrophy by her description.  She presents today for further evaluation of some chest pains. These episodes of chest pains are quite atypical. She's able to work for 8 hours a day not had any particular problems. She's been to have episodes of chest discomfort. These episodes and obsession with eating, drinking, change of position, or taking a deep breath.  She has history of hypertension. She was previously on HCTZ as well as amlodipine. She ran out of her HCTZ.   She did not like the way the HCTZ made her legs look skinny.  Current Outpatient Prescriptions on File Prior to Visit  Medication Sig Dispense Refill  . AMLODIPINE BESYLATE PO Take 10 mg by mouth daily.        Allergies  Allergen Reactions  . Sulfonamide Derivatives     Past Medical History  Diagnosis Date  . Pain in joint, hand   . Hypertension   . Pure hypercholesterolemia   . Vaginal irritation     Past Surgical History  Procedure Date  . Vaginal hysterectomy     History  Smoking status  . Never Smoker   Smokeless tobacco  . Not on file    History  Alcohol Use: Not on file    Family History  Problem Relation Age of Onset  . Hypertension Mother   . Hypertension Father     Reviw of Systems:  Reviewed in the HPI.  All other systems are negative.  Physical Exam: Blood pressure 150/99, pulse 66, height 5\' 10"  (1.778 m), weight  198 lb (89.812 kg). General: Well developed, well nourished, in no acute distress.  Head: Normocephalic, atraumatic, sclera non-icteric, mucus membranes are moist,   Neck: Supple. Carotids are 2 + without bruits. No JVD  Lungs: Clear bilaterally to auscultation.  Heart: regular rate.  normal  S1 S2. No murmurs, gallops or rubs.  Abdomen: Soft, non-tender, non-distended with normal bowel sounds. No hepatomegaly. No rebound/guarding. No masses.  Msk:  Strength and tone are normal  Extremities: No clubbing or cyanosis. No edema.  Distal pedal pulses are 2+ and equal bilaterally.  Neuro: Alert and oriented X 3. Moves all extremities spontaneously.  Psych:  Responds to questions appropriately with a normal affect.  ECG: 12/21/2011-sinus bradycardia at 58 beats a minute. She has minimal voltage criteria for left ventricular hypertrophy.  Assessment / Plan:

## 2012-02-22 NOTE — Patient Instructions (Addendum)
Your physician recommends that you return for lab work in: TODAY AND IN 3 MONTHS    Your physician recommends that you schedule a follow-up appointment in: 3 MONTHS    Your physician has recommended you make the following change in your medication:   START TAKING KDUR// POTASSIUM 10 MEQ DAILY, CALLED TO HEALTH SERVE  Your physician recommends that you return for a FASTING lipid profile: 3 MONTHS   REDUCE HIGH SODIUM FOODS LIKE CANNED SOUP, GRAVY, SAUCES, READY PREPARED FOODS LIKE FROZEN FOODS; LEAN CUISINE, LASAGNA. BACON, SAUSAGE, LUNCH MEAT, FAST FOODS.Marland Kitchen     DASH Diet The DASH diet stands for "Dietary Approaches to Stop Hypertension." It is a healthy eating plan that has been shown to reduce high blood pressure (hypertension) in as little as 14 days, while also possibly providing other significant health benefits. These other health benefits include reducing the risk of breast cancer after menopause and reducing the risk of type 2 diabetes, heart disease, colon cancer, and stroke. Health benefits also include weight loss and slowing kidney failure in patients with chronic kidney disease.  DIET GUIDELINES  Limit salt (sodium). Your diet should contain less than 1500 mg of sodium daily.   Limit refined or processed carbohydrates. Your diet should include mostly whole grains. Desserts and added sugars should be used sparingly.   Include small amounts of heart-healthy fats. These types of fats include nuts, oils, and tub margarine. Limit saturated and trans fats. These fats have been shown to be harmful in the body.  CHOOSING FOODS  The following food groups are based on a 2000 calorie diet. See your Registered Dietitian for individual calorie needs. Grains and Grain Products (6 to 8 servings daily)  Eat More Often: Whole-wheat bread, brown rice, whole-grain or wheat pasta, quinoa, popcorn without added fat or salt (air popped).   Eat Less Often: White bread, white pasta, white rice,  cornbread.  Vegetables (4 to 5 servings daily)  Eat More Often: Fresh, frozen, and canned vegetables. Vegetables may be raw, steamed, roasted, or grilled with a minimal amount of fat.   Eat Less Often/Avoid: Creamed or fried vegetables. Vegetables in a cheese sauce.  Fruit (4 to 5 servings daily)  Eat More Often: All fresh, canned (in natural juice), or frozen fruits. Dried fruits without added sugar. One hundred percent fruit juice ( cup [237 mL] daily).   Eat Less Often: Dried fruits with added sugar. Canned fruit in light or heavy syrup.  [employer], Fish, and Poultry (2 servings or less daily. One serving is 3 to 4 oz [85-114 g]).  Eat More Often: Ninety percent or leaner ground beef, tenderloin, sirloin. Round cuts of beef, chicken breast, [place] breast. All fish. Grill, bake, or broil your meat. Nothing should be fried.   Eat Less Often/Avoid: Fatty cuts of meat, [place], or chicken leg, thigh, or wing. Fried cuts of meat or fish.  Dairy (2 to 3 servings)  Eat More Often: Low-fat or fat-free milk, low-fat plain or light yogurt, reduced-fat or part-skim cheese.   Eat Less Often/Avoid: Milk (whole, 2%, skim, or chocolate).Whole milk yogurt. Full-fat cheeses.  Nuts, Seeds, and Legumes (4 to 5 servings per week)  Eat More Often: All without added salt.   Eat Less Often/Avoid: Salted nuts and seeds, canned beans with added salt.  Fats and Sweets (limited)  Eat More Often: Vegetable oils, tub margarines without trans fats, sugar-free gelatin. Mayonnaise and salad dressings.   Eat Less Often/Avoid: Coconut oils, palm oils, butter,  stick margarine, cream, half and half, cookies, candy, pie.  FOR MORE INFORMATION The Dash Diet Eating Plan: www.dashdiet.org Document Released: 09/13/2011 Document Reviewed: 09/03/2011 [facility] Patient Information 2012 [place], [place].

## 2012-03-24 ENCOUNTER — Encounter: Payer: Self-pay | Admitting: Cardiovascular Disease

## 2012-05-23 ENCOUNTER — Ambulatory Visit: Payer: Self-pay | Admitting: Cardiovascular Disease

## 2012-08-23 ENCOUNTER — Emergency Department (HOSPITAL_COMMUNITY)
Admission: EM | Admit: 2012-08-23 | Discharge: 2012-08-23 | Disposition: A | Discharge status: Home / Self Care | Payer: Self-pay | Attending: Emergency Medicine | Admitting: Emergency Medicine

## 2012-08-23 ENCOUNTER — Emergency Department (HOSPITAL_COMMUNITY): Payer: Self-pay

## 2012-08-23 ENCOUNTER — Encounter (HOSPITAL_COMMUNITY): Payer: Self-pay | Admitting: Physical Medicine and Rehabilitation

## 2012-08-23 DIAGNOSIS — M25552 Pain in left hip: Secondary | ICD-10-CM

## 2012-08-23 DIAGNOSIS — Z79899 Other long term (current) drug therapy: Secondary | ICD-10-CM | POA: Insufficient documentation

## 2012-08-23 DIAGNOSIS — I10 Essential (primary) hypertension: Secondary | ICD-10-CM | POA: Insufficient documentation

## 2012-08-23 DIAGNOSIS — M25559 Pain in unspecified hip: Secondary | ICD-10-CM | POA: Insufficient documentation

## 2012-08-23 DIAGNOSIS — Z8742 Personal history of other diseases of the female genital tract: Secondary | ICD-10-CM | POA: Insufficient documentation

## 2012-08-23 DIAGNOSIS — E7800 Pure hypercholesterolemia, unspecified: Secondary | ICD-10-CM | POA: Insufficient documentation

## 2012-08-23 MED ORDER — IBUPROFEN 400 MG PO TABS: 400.0000 mg | ORAL_TABLET | Freq: Four times a day (QID) | ORAL | Status: DC | PRN

## 2012-08-23 MED ORDER — OXYCODONE-ACETAMINOPHEN 5-325 MG PO TABS
1.0000 | ORAL_TABLET | Freq: Once | ORAL | Status: DC
  Filled 2012-08-23: qty 1

## 2012-08-23 MED ORDER — OXYCODONE-ACETAMINOPHEN 5-325 MG PO TABS: 1.0000 | ORAL_TABLET | ORAL | Status: DC | PRN

## 2012-08-23 MED ORDER — IBUPROFEN 400 MG PO TABS
600.0000 mg | ORAL_TABLET | Freq: Once | ORAL | Status: AC
  Administered 2012-08-23: 600 mg via ORAL
  Filled 2012-08-23: qty 1

## 2012-08-23 NOTE — ED Provider Notes (Signed)
History  This chart was scribed for [name], MD by Ladona Ridgel Day, ED scribe. This patient was seen in room TR09C/TR09C and the patient's care was started at 1232.   CSN: [id number]  Arrival date & time 08/23/12  1232   None     Chief Complaint  Patient presents with  . Hip Pain   Patient is a 51 y.o. female presenting with hip pain. The history is provided by the patient. No language interpreter was used.  Hip Pain This is a new problem. The current episode started more than 1 week ago. The problem occurs daily. The problem has not changed since onset.Pertinent negatives include no chest pain and no shortness of breath. The symptoms are aggravated by walking. The symptoms are relieved by lying down. She has tried nothing for the symptoms.   Tara Holland is a 51 y.o. female who presents to the Emergency Department complaining of intermittent left anterior hip/groin pain over the past 2 months. She states pain is illicited with weight bearing or walking but no pain at rest lying down. She denies any previous injuries to her hip, and has not taken any medicines for this problem. She denies any injuries to her hip. She denies weakness, numbness, back pain, dysuria, incontinence.   Past Medical History  Diagnosis Date  . Pain in joint, hand   . Hypertension   . Pure hypercholesterolemia   . Vaginal irritation     Past Surgical History  Procedure Date  . Vaginal hysterectomy     Family History  Problem Relation Age of Onset  . Hypertension Mother   . Hypertension Father     History  Substance Use Topics  . Smoking status: Never Smoker   . Smokeless tobacco: Not on file  . Alcohol Use: No    OB History    Grav Para Term Preterm Abortions TAB SAB Ect Mult Living                  Review of Systems  Constitutional: Negative for fever and chills.  Respiratory: Negative for shortness of breath.   Cardiovascular: Negative for chest pain.  Gastrointestinal: Negative for  nausea and vomiting.  Musculoskeletal: Negative for back pain.       Left anterior hip pain around groin  Neurological: Negative for weakness and numbness.  All other systems reviewed and are negative.    Allergies  Sulfonamide derivatives  Home Medications   Current Outpatient Rx  Name  Route  Sig  Dispense  Refill  . AMLODIPINE BESYLATE 10 MG PO TABS   Oral   Take 10 mg by mouth every other day.           Triage Vitals: BP 137/89  Pulse 84  Temp 98.4 F (36.9 C) (Oral)  Resp 18  SpO2 99%  Physical Exam  Nursing note and vitals reviewed. Constitutional: She appears well-developed and well-nourished. No distress.  HENT:  Head: Normocephalic and atraumatic.  Eyes: Conjunctivae normal are normal. Right eye exhibits no discharge. Left eye exhibits no discharge.  Neck: Neck supple.  Cardiovascular: Normal rate, regular rhythm and normal heart sounds.  Exam reveals no gallop and no friction rub.   No murmur heard. Pulmonary/Chest: Effort normal and breath sounds normal. No respiratory distress.  Abdominal: Soft. She exhibits no distension. There is no tenderness.  Musculoskeletal: She exhibits tenderness. She exhibits no edema.       Increased left hip pain with hib abduction.  Neurological: She is alert.  Skin: Skin is warm and dry.  Psychiatric: She has a normal mood and affect. Her behavior is normal. Thought content normal.    ED Course  Procedures (including critical care time) DIAGNOSTIC STUDIES: Oxygen Saturation is 99% on room air, normal by my interpretation.    COORDINATION OF CARE: At 250 PM Discussed treatment plan with patient which includes left hip X-ray, pain medicine. Patient agrees.   Labs Reviewed - No data to display No results found.   1. Left hip pain       MDM  84-year-old female with atraumatic right hip pain. Possibly osteoarthritis. Very low suspicion for septic joint or pathologic fracture. Neurovascularly intact. Plan symptomatic  treatment. Return precautions discussed. Outpatient followup otherwise.  I personally preformed the services scribed in my presence. The recorded information has been reviewed and is accurate. [name], MD.          [name], MD 08/25/12 1000

## 2012-08-23 NOTE — ED Notes (Signed)
Pt c/o left sided groin pain, reports the pain is worse when she tries to walk and gets bilateral leg pain when she sleeps at night. Pt states she has been having this pain for a while but it has just increasingly gotten worse of over the past couple of days.

## 2012-08-23 NOTE — ED Notes (Signed)
Pt presents to department for evaluation of R sided hip pain. Ongoing x2 months. States pain became worse on Friday night. CMS intact, denies recent injury. 8/10 pain at the time, increases with movement. She is alert and oriented x4. No signs of distress noted.

## 2013-01-14 ENCOUNTER — Emergency Department (HOSPITAL_COMMUNITY)
Admission: EM | Admit: 2013-01-14 | Discharge: 2013-01-14 | Disposition: A | Discharge status: Home / Self Care | Payer: Self-pay | Source: Home / Self Care

## 2013-01-14 ENCOUNTER — Encounter (HOSPITAL_COMMUNITY): Payer: Self-pay

## 2013-01-14 DIAGNOSIS — E7800 Pure hypercholesterolemia, unspecified: Secondary | ICD-10-CM

## 2013-01-14 DIAGNOSIS — I10 Essential (primary) hypertension: Secondary | ICD-10-CM

## 2013-01-14 DIAGNOSIS — B379 Candidiasis, unspecified: Secondary | ICD-10-CM

## 2013-01-14 DIAGNOSIS — M549 Dorsalgia, unspecified: Secondary | ICD-10-CM

## 2013-01-14 LAB — BASIC METABOLIC PANEL
BUN: 12 mg/dL (ref 6–23)
CO2: 28 mEq/L (ref 19–32)
Chloride: 104 mEq/L (ref 96–112)
Creatinine, Ser: 1.11 mg/dL — ABNORMAL HIGH (ref 0.50–1.10)

## 2013-01-14 LAB — LIPID PANEL
Cholesterol: 245 mg/dL — ABNORMAL HIGH (ref 0–200)
Total CHOL/HDL Ratio: 4.4 RATIO
VLDL: 12 mg/dL (ref 0–40)

## 2013-01-14 MED ORDER — AMLODIPINE BESYLATE 10 MG PO TABS: 10.0000 mg | ORAL_TABLET | ORAL | Status: DC

## 2013-01-14 MED ORDER — HYDROCHLOROTHIAZIDE 25 MG PO TABS: 25.0000 mg | ORAL_TABLET | Freq: Every day | ORAL | Status: DC

## 2013-01-14 NOTE — ED Provider Notes (Signed)
History     CSN: [id number]  Arrival date & time 01/14/13  1217   First MD Initiated Contact with Patient 01/14/13 1228      Chief Complaint  Patient presents with  . Hypertension    (Consider location/radiation/quality/duration/timing/severity/associated sxs/prior treatment) HPI Pt is 51 you female who presents to clinic for follow up on blood pressure. She reports she has no PCP and has had an appointment scheduled with [facility] but it is too far out. She denies chest pain, shortness of breath, no sicknesses or hospitalization. She needs refill on her BP medication and she reports that she has been out of BP medications for one week.  Past Medical History  Diagnosis Date  . Pain in joint, hand   . Hypertension   . Pure hypercholesterolemia   . Vaginal irritation     Past Surgical History  Procedure Laterality Date  . Vaginal hysterectomy      Family History  Problem Relation Age of Onset  . Hypertension Mother   . Hypertension Father     History  Substance Use Topics  . Smoking status: Never Smoker   . Smokeless tobacco: Not on file  . Alcohol Use: No    OB History   Grav Para Term Preterm Abortions TAB SAB Ect Mult Living                  Review of Systems Review of Systems  Constitutional: Negative for fever, chills, diaphoresis, activity change, appetite change and fatigue.  HENT: Negative for ear pain, nosebleeds, congestion, facial swelling, rhinorrhea, neck pain, neck stiffness and ear discharge.   Eyes: Negative for pain, discharge, redness, itching and visual disturbance.  Respiratory: Negative for cough, choking, chest tightness, shortness of breath, wheezing and stridor.   Cardiovascular: Negative for chest pain, palpitations and leg swelling.  Gastrointestinal: Negative for abdominal distention.  Genitourinary: Negative for dysuria, urgency, frequency, hematuria, flank pain, decreased urine volume, difficulty urinating and dyspareunia.   Musculoskeletal: Negative for back pain, joint swelling, arthralgias and gait problem.  Neurological: Negative for dizziness, tremors, seizures, syncope, facial asymmetry, speech difficulty, weakness, light-headedness, numbness and headaches.  Hematological: Negative for adenopathy. Does not bruise/bleed easily.  Psychiatric/Behavioral: Negative for hallucinations, behavioral problems, confusion, dysphoric mood, decreased concentration and agitation.    Allergies  Sulfonamide derivatives  Home Medications   Current Outpatient Rx  Name  Route  Sig  Dispense  Refill  . amLODipine (NORVASC) 10 MG tablet   Oral   Take 1 tablet (10 mg total) by mouth every other day.   30 tablet   3   . hydrochlorothiazide (HYDRODIURIL) 25 MG tablet   Oral   Take 1 tablet (25 mg total) by mouth daily.   30 tablet   5   . ibuprofen (ADVIL,MOTRIN) 400 MG tablet   Oral   Take 1 tablet (400 mg total) by mouth every 6 (six) hours as needed for pain.   30 tablet   0   . oxyCODONE-acetaminophen (PERCOCET/ROXICET) 5-325 MG per tablet   Oral   Take 1 tablet by mouth every 4 (four) hours as needed for pain.   10 tablet   0     BP 189/101  Pulse 54  Temp(Src) 98 F (36.7 C) (Oral)  Resp 18  SpO2 100%  Physical Exam Physical Exam  Constitutional: Appears well-developed and well-nourished. No distress.  HENT: Normocephalic. External right and left ear normal. Oropharynx is clear and moist.  Eyes: Conjunctivae and EOM are normal.  PERRLA, no scleral icterus.  Neck: Normal ROM. Neck supple. No JVD. No tracheal deviation. No thyromegaly.  CVS: RRR, S1/S2 +, no murmurs, no gallops, no carotid bruit.  Pulmonary: Effort and breath sounds normal, no stridor, rhonchi, wheezes, rales.  Abdominal: Soft. BS +,  no distension, tenderness, rebound or guarding.  Musculoskeletal: Normal range of motion. No edema and no tenderness.  Lymphadenopathy: No lymphadenopathy noted, cervical, inguinal. Neuro: Alert.  Normal reflexes, muscle tone coordination. No cranial nerve deficit. Skin: Skin is warm and dry. No rash noted. Not diaphoretic. No erythema. No pallor.  Psychiatric: Normal mood and affect. Behavior, judgment, thought content normal.    ED Course  Procedures (including critical care time)  Labs Reviewed  BASIC METABOLIC PANEL  LIPID PANEL   No results found.   1. HYPERTENSION, BENIGN ESSENTIAL - will provide refill, discussed importance of regular BP checks and I have advised her to call us back if her BP > 140/90, will check BMP  2. HYPERCHOLESTEROLEMIA - will check lipid panel      MDM  HTN, HLD        Dorothea Ogle, MD 01/14/13 1248

## 2013-01-14 NOTE — ED Notes (Signed)
Patient has a history  Of HTN Needs medication refill

## 2013-02-09 ENCOUNTER — Encounter: Payer: Self-pay | Admitting: Internal Medicine

## 2014-02-05 ENCOUNTER — Other Ambulatory Visit: Payer: Self-pay | Admitting: Internal Medicine

## 2015-07-07 ENCOUNTER — Other Ambulatory Visit: Payer: Self-pay | Admitting: Obstetrics and Gynecology

## 2015-07-07 DIAGNOSIS — N63 Unspecified lump in unspecified breast: Secondary | ICD-10-CM

## 2015-07-14 ENCOUNTER — Other Ambulatory Visit: Payer: Self-pay

## 2015-07-22 ENCOUNTER — Ambulatory Visit
Admission: RE | Admit: 2015-07-22 | Discharge: 2015-07-22 | Disposition: A | Discharge status: Home / Self Care | Payer: Commercial Managed Care - HMO | Source: Ambulatory Visit | Attending: Obstetrics and Gynecology | Admitting: Obstetrics and Gynecology

## 2015-07-22 DIAGNOSIS — N63 Unspecified lump in unspecified breast: Secondary | ICD-10-CM

## 2015-11-10 ENCOUNTER — Encounter: Payer: Self-pay | Admitting: Internal Medicine

## 2015-11-10 ENCOUNTER — Other Ambulatory Visit: Payer: Commercial Managed Care - HMO

## 2015-11-10 ENCOUNTER — Ambulatory Visit (INDEPENDENT_AMBULATORY_CARE_PROVIDER_SITE_OTHER): Payer: Commercial Managed Care - HMO | Admitting: Internal Medicine

## 2015-11-10 VITALS — BP 160/100 | HR 66 | Temp 98.6°F | Resp 14 | Ht 70.0 in | Wt 202.1 lb

## 2015-11-10 DIAGNOSIS — E7800 Pure hypercholesterolemia, unspecified: Secondary | ICD-10-CM

## 2015-11-10 DIAGNOSIS — J385 Laryngeal spasm: Secondary | ICD-10-CM

## 2015-11-10 DIAGNOSIS — I10 Essential (primary) hypertension: Secondary | ICD-10-CM | POA: Diagnosis not present

## 2015-11-10 MED ORDER — AMLODIPINE BESY-BENAZEPRIL HCL 5-10 MG PO CAPS: 1.0000 | ORAL_CAPSULE | Freq: Every day | ORAL | Status: DC

## 2015-11-10 MED ORDER — FLUTICASONE PROPIONATE 50 MCG/ACT NA SUSP: 2.0000 | Freq: Every day | NASAL | Status: DC

## 2015-11-10 NOTE — Progress Notes (Signed)
Pre visit review using our clinic review tool, if applicable. No additional management support is needed unless otherwise documented below in the visit note. 

## 2015-11-10 NOTE — Patient Instructions (Signed)
We have sent in the flonase for the nose dripping that you can use to see if it helps with the singing. Use 2 sprays in each nostril daily.   We will also get you in with the ear nose and throat doctor so they can look at the vocal cords.   Consider not eating within 3 hours of bedtime to help with the heartburn.   We will check the labs today and sent in the blood pressure medicine.   It is okay for you to start exercising again.   Food Choices for Gastroesophageal Reflux Disease, Adult When you have gastroesophageal reflux disease (GERD), the foods you eat and your eating habits are very important. Choosing the right foods can help ease the discomfort of GERD. WHAT GENERAL GUIDELINES DO I NEED TO FOLLOW?  Choose fruits, vegetables, whole grains, low-fat dairy products, and low-fat meat, fish, and poultry.  Limit fats such as oils, salad dressings, butter, nuts, and avocado.  Keep a food diary to identify foods that cause symptoms.  Avoid foods that cause reflux. These may be different for different people.  Eat frequent small meals instead of three large meals each day.  Eat your meals slowly, in a relaxed setting.  Limit fried foods.  Cook foods using methods other than frying.  Avoid drinking alcohol.  Avoid drinking large amounts of liquids with your meals.  Avoid bending over or lying down until 2-3 hours after eating. WHAT FOODS ARE NOT RECOMMENDED? The following are some foods and drinks that may worsen your symptoms: Vegetables Tomatoes. Tomato juice. Tomato and spaghetti sauce. Chili peppers. Onion and garlic. Horseradish. Fruits Oranges, grapefruit, and lemon (fruit and juice). Meats High-fat meats, fish, and poultry. This includes hot dogs, ribs, ham, sausage, salami, and bacon. Dairy Whole milk and chocolate milk. Sour cream. Cream. Butter. Ice cream. Cream cheese.  Beverages Coffee and tea, with or without caffeine. Carbonated beverages or energy  drinks. Condiments Hot sauce. Barbecue sauce.  Sweets/Desserts Chocolate and cocoa. Donuts. Peppermint and spearmint. Fats and Oils High-fat foods, including [place] fries and potato chips. Other Vinegar. Strong spices, such as black pepper, white pepper, red pepper, cayenne, curry powder, cloves, ginger, and chili powder. The items listed above may not be a complete list of foods and beverages to avoid. Contact your dietitian for more information.   This information is not intended to replace advice given to you by your health care provider. Make sure you discuss any questions you have with your health care provider.   Document Released: 09/24/2005 Document Revised: 10/15/2014 Document Reviewed: 07/29/2013 Elsevier Interactive Patient Education [employer].

## 2015-11-11 DIAGNOSIS — J385 Laryngeal spasm: Secondary | ICD-10-CM | POA: Insufficient documentation

## 2015-11-11 NOTE — Assessment & Plan Note (Signed)
She is out of her BP meds right now and refill her amlodipine/benazepril. If BP not controlled with that will adjust therapy. Checking labs today.

## 2015-11-11 NOTE — Progress Notes (Signed)
   Subjective:    Patient ID: Tara Holland, female    DOB: [birth year], 55 y.o.   MRN: [id number]  HPI The patient is a 55 YO female coming in new for several problems. The first is some problems with her voice. If she starts singing she will get into a coughing fit. 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 She does have constant nasal dripping which is year round. She does not take anything for this. She denies weight change. No problems with swallowing food. She does also admit to some acid reflux with eating late at night or spaghetti foods.  Next concern is some pain with bowel movements and discomfort. She has had hysterectomy in the past and is not sure if it could be scar tissue. Denies blood in stools, diarrhea. Has some mild constipation. Has not seen GI in the past. She did have colonoscopy 2 years ago which was normal.   PMH, [facility], social history reviewed and updated. Please see A/P for status and treatment of chronic medical problems.   Review of Systems  Constitutional: Positive for activity change. Negative for fever, appetite change, fatigue and unexpected weight change.       Cannot sing  HENT: Positive for postnasal drip and rhinorrhea. Negative for congestion, drooling, ear discharge, ear pain, nosebleeds, sinus pressure and sore throat.   Eyes: Negative.   Respiratory: Positive for cough. Negative for chest tightness, shortness of breath and wheezing.   Cardiovascular: Negative for chest pain, palpitations and leg swelling.  Gastrointestinal: Positive for abdominal pain. Negative for nausea, diarrhea, constipation, blood in stool and abdominal distention.       Mild gerd  Musculoskeletal: Negative.   Skin: Negative.   Neurological: Negative.   Psychiatric/Behavioral: Negative.       Objective:   Physical Exam  Constitutional: She is oriented to person, place, and time. She appears well-developed and well-nourished.  HENT:    Head: Normocephalic and atraumatic.  Oropharynx with mild redness and clear drainage, nasal turbinates without crusting, no sinus tenderness  Eyes: EOM are normal.  Neck: Normal range of motion. Neck supple.  Cardiovascular: Normal rate and regular rhythm.   Pulmonary/Chest: Effort normal and breath sounds normal. No respiratory distress. She has no wheezes. She has no rales.  Abdominal: Soft. Bowel sounds are normal. She exhibits no distension. There is no tenderness. There is no rebound.  Musculoskeletal: She exhibits no edema.  Lymphadenopathy:    She has no cervical adenopathy.  Neurological: She is alert and oriented to person, place, and time. Coordination normal.  Skin: Skin is warm and dry.  Psychiatric: She has a normal mood and affect.   Filed Vitals:   11/10/15 0913 11/10/15 0950  BP: 158/98 160/100  Pulse: 66   Temp: 98.6 F (37 C)   TempSrc: Oral   Resp: 14   Height: 5\' 10"  (1.778 m)   Weight: 202 lb 1.9 oz (91.681 kg)   SpO2: 97%       Assessment & Plan:

## 2015-11-12 LAB — COMPREHENSIVE METABOLIC PANEL
ALT: 16 IU/L (ref 0–32)
AST: 19 IU/L (ref 0–40)
Albumin/Globulin Ratio: 1.2 (ref 1.1–2.5)
Albumin: 4 g/dL (ref 3.5–5.5)
Alkaline Phosphatase: 85 IU/L (ref 39–117)
BUN/Creatinine Ratio: 14 (ref 9–23)
BUN: 18 mg/dL (ref 6–24)
Bilirubin Total: 0.4 mg/dL (ref 0.0–1.2)
CALCIUM: 9.9 mg/dL (ref 8.7–10.2)
CO2: 25 mmol/L (ref 18–29)
CREATININE: 1.3 mg/dL — AB (ref 0.57–1.00)
Chloride: 103 mmol/L (ref 96–106)
GFR, EST AFRICAN AMERICAN: 53 mL/min/{1.73_m2} — AB (ref >59)
GFR, EST NON AFRICAN AMERICAN: 46 mL/min/{1.73_m2} — AB (ref >59)
GLUCOSE: 101 mg/dL — AB (ref 65–99)
Globulin, Total: 3.3 g/dL (ref 1.5–4.5)
Potassium: 4.3 mmol/L (ref 3.5–5.2)
Sodium: 144 mmol/L (ref 134–144)
TOTAL PROTEIN: 7.3 g/dL (ref 6.0–8.5)

## 2015-11-12 LAB — LIPID PANEL
CHOL/HDL RATIO: 4.6 ratio — AB (ref 0.0–4.4)
Cholesterol, Total: 254 mg/dL — ABNORMAL HIGH (ref 100–199)
HDL: 55 mg/dL (ref >39)
LDL Calculated: 177 mg/dL — ABNORMAL HIGH (ref 0–99)
Triglycerides: 110 mg/dL (ref 0–149)
VLDL CHOLESTEROL CAL: 22 mg/dL (ref 5–40)

## 2015-11-13 ENCOUNTER — Encounter: Payer: Self-pay | Admitting: Internal Medicine

## 2015-11-13 NOTE — Assessment & Plan Note (Signed)
Will refer to ENT given ongoing nature of the symptoms. Will also treat with flonase to encourage her sinus drinage to subside since this could be irritating the vocal cords. If this is not helping could try treating silent gerd as she has some episodes of gerd with eating later at night. She is a never smoker which decreases the risk of cancer.

## 2015-11-13 NOTE — Assessment & Plan Note (Signed)
Checking lipid panel and adjust as needed. Not on medication currently but she admits cholesterol has been high in the past.

## 2015-11-24 ENCOUNTER — Telehealth: Payer: Self-pay | Admitting: *Deleted

## 2015-11-24 MED ORDER — AMLODIPINE BESY-BENAZEPRIL HCL 5-10 MG PO CAPS: 1.0000 | ORAL_CAPSULE | Freq: Every day | ORAL | Status: DC

## 2015-11-24 NOTE — Telephone Encounter (Signed)
Receive call pt states no longer can use [facility] need BP med sent to [facility]. Sent electronically...Tara Holland

## 2016-05-24 ENCOUNTER — Ambulatory Visit (INDEPENDENT_AMBULATORY_CARE_PROVIDER_SITE_OTHER): Payer: Commercial Managed Care - HMO | Admitting: Internal Medicine

## 2016-05-24 ENCOUNTER — Encounter: Payer: Self-pay | Admitting: Internal Medicine

## 2016-05-24 ENCOUNTER — Other Ambulatory Visit: Payer: Commercial Managed Care - HMO

## 2016-05-24 VITALS — BP 150/120 | HR 61 | Temp 98.2°F | Resp 16 | Ht 70.0 in | Wt 207.0 lb

## 2016-05-24 DIAGNOSIS — I10 Essential (primary) hypertension: Secondary | ICD-10-CM

## 2016-05-24 DIAGNOSIS — Z1159 Encounter for screening for other viral diseases: Secondary | ICD-10-CM | POA: Diagnosis not present

## 2016-05-24 DIAGNOSIS — R109 Unspecified abdominal pain: Secondary | ICD-10-CM

## 2016-05-24 NOTE — Assessment & Plan Note (Signed)
BP elevated likely due to pain. Will monitor with controlled pain.

## 2016-05-24 NOTE — Progress Notes (Signed)
Pre visit review using our clinic review tool, if applicable. No additional management support is needed unless otherwise documented below in the visit note. 

## 2016-05-24 NOTE — Patient Instructions (Signed)
We will have you do the labs today and we will get the CT scan scheduled with the insurance.

## 2016-05-24 NOTE — Progress Notes (Signed)
   Subjective:    Patient ID: Tara Holland, female    DOB: 08-03-1961, 55 y.o.   MRN: [id number]  HPI The patient is a 55 YO female coming in for lower abdominal pain. She has been having it the last 1-2 weeks. 10/10 severe in the morning and goes away throughout the day. Denies any change to activity or diet. Denies fevers or chills. No diarrhea or constipation. Also having some pain in the upper stomach which waxes and wanes during the day.   Review of Systems  Constitutional: Positive for activity change. Negative for appetite change, fatigue, fever and unexpected weight change.  HENT: Negative for congestion, drooling, ear discharge, ear pain, nosebleeds, sinus pressure and sore throat.   Eyes: Negative.   Respiratory: Negative for chest tightness, shortness of breath and wheezing.   Cardiovascular: Negative for chest pain, palpitations and leg swelling.  Gastrointestinal: Positive for abdominal distention and abdominal pain. Negative for blood in stool, constipation, diarrhea, nausea and vomiting.       Mild gerd  Musculoskeletal: Negative.   Skin: Negative.   Neurological: Negative.   Psychiatric/Behavioral: Negative.       Objective:   Physical Exam  Constitutional: She is oriented to person, place, and time. She appears well-developed and well-nourished.  HENT:  Head: Normocephalic and atraumatic.  Eyes: EOM are normal.  Neck: Normal range of motion. Neck supple.  Cardiovascular: Normal rate and regular rhythm.   Pulmonary/Chest: Effort normal and breath sounds normal. No respiratory distress. She has no wheezes. She has no rales.  Abdominal: Soft. Bowel sounds are normal. She exhibits no distension and no mass. There is tenderness. There is no rebound and no guarding.  Pain in the lower abdomen without guarding or rebound  Lymphadenopathy:    She has no cervical adenopathy.  Neurological: She is alert and oriented to person, place, and time. Coordination normal.  Skin: Skin  is warm and dry.   Vitals:   05/24/16 1032  BP: (!) 150/120  Pulse: 61  Resp: 16  Temp: 98.2 F (36.8 C)  TempSrc: Oral  SpO2: 99%  Weight: 207 lb (93.9 kg)  Height: 5\' 10"  (1.778 m)      Assessment & Plan:

## 2016-05-24 NOTE — Assessment & Plan Note (Signed)
Checking CBC, CMP, lipase as well as CT abdomen and pelvis to look for diverticulitis given the location and severity of the pain. She is advised to use her current medications for pain.

## 2016-05-25 LAB — COMPREHENSIVE METABOLIC PANEL
A/G RATIO: 1.3 (ref 1.2–2.2)
ALK PHOS: 88 IU/L (ref 39–117)
ALT: 17 IU/L (ref 0–32)
AST: 18 IU/L (ref 0–40)
Albumin: 4 g/dL (ref 3.5–5.5)
BUN/Creatinine Ratio: 14 (ref 9–23)
BUN: 18 mg/dL (ref 6–24)
Bilirubin Total: 0.4 mg/dL (ref 0.0–1.2)
CALCIUM: 10 mg/dL (ref 8.7–10.2)
CHLORIDE: 102 mmol/L (ref 96–106)
CO2: 27 mmol/L (ref 18–29)
Creatinine, Ser: 1.31 mg/dL — ABNORMAL HIGH (ref 0.57–1.00)
GFR calc Af Amer: 52 mL/min/{1.73_m2} — ABNORMAL LOW (ref >59)
GFR, EST NON AFRICAN AMERICAN: 45 mL/min/{1.73_m2} — AB (ref >59)
GLOBULIN, TOTAL: 3.2 g/dL (ref 1.5–4.5)
Glucose: 113 mg/dL — ABNORMAL HIGH (ref 65–99)
POTASSIUM: 4.8 mmol/L (ref 3.5–5.2)
SODIUM: 143 mmol/L (ref 134–144)
Total Protein: 7.2 g/dL (ref 6.0–8.5)

## 2016-05-25 LAB — CBC
HEMOGLOBIN: 13.4 g/dL (ref 11.1–15.9)
Hematocrit: 41.3 % (ref 34.0–46.6)
MCH: 28.8 pg (ref 26.6–33.0)
MCHC: 32.4 g/dL (ref 31.5–35.7)
MCV: 89 fL (ref 79–97)
PLATELETS: 258 10*3/uL (ref 150–379)
RBC: 4.65 x10E6/uL (ref 3.77–5.28)
RDW: 14.4 % (ref 12.3–15.4)
WBC: 7.9 10*3/uL (ref 3.4–10.8)

## 2016-05-25 LAB — HEPATITIS C ANTIBODY

## 2016-05-25 LAB — HIV ANTIBODY (ROUTINE TESTING W REFLEX): HIV SCREEN 4TH GENERATION: NONREACTIVE

## 2016-05-25 LAB — LIPASE: LIPASE: 28 U/L (ref 0–59)

## 2016-06-04 ENCOUNTER — Telehealth: Payer: Self-pay

## 2016-06-04 NOTE — Telephone Encounter (Signed)
I am not sure what the issue is with her eyes but would recommend that instead she see an eye doctor first.

## 2016-06-04 NOTE — Telephone Encounter (Signed)
Called patient back and informed her of the notes. She was going to call her eye doctor and see what they have to say. Other wise she may just make an app with this office. She will follow up once she has more information. Thank you.

## 2016-06-04 NOTE — Telephone Encounter (Signed)
Yes I understand. The scan is tomorrow, I dont believe she will have enough time to get the records before then. I will see if she wants to come in this afternoon? Or Just have her eye doctor set up a scan for the head.

## 2016-06-04 NOTE — Telephone Encounter (Signed)
I do not recall and she would need a visit. It would help too if we could get the records from the eye doctor.

## 2016-06-04 NOTE — Telephone Encounter (Signed)
Patient is having a ct scan tomorrow of her abdominal while she is already there she wants to know if she can have one of her head as well due to the issues she has with her eyes. She called the office doing the scan and they said they can do that they just need and order put in from Dr. Sharlet Salina. She starts that on Saturday she had a weird issues with her eyes happen again. Please advise. Thank you.

## 2016-06-04 NOTE — Telephone Encounter (Signed)
Patient states she thought you knew about her eye issues. She did see the eye doctor. He told her it wasn't her eyes its something in her head doing it and a CT scan should be done. Should I  Have her call her eye doctor order her that scan?

## 2016-06-05 ENCOUNTER — Other Ambulatory Visit: Payer: Self-pay | Admitting: Internal Medicine

## 2016-06-05 ENCOUNTER — Ambulatory Visit
Admission: RE | Admit: 2016-06-05 | Discharge: 2016-06-05 | Disposition: A | Discharge status: Home / Self Care | Payer: Commercial Managed Care - HMO | Source: Ambulatory Visit | Attending: Internal Medicine | Admitting: Internal Medicine

## 2016-06-05 DIAGNOSIS — R109 Unspecified abdominal pain: Secondary | ICD-10-CM

## 2016-06-05 MED ORDER — IOPAMIDOL (ISOVUE-300) INJECTION 61%
125.0000 mL | Freq: Once | INTRAVENOUS | Status: AC | PRN
  Administered 2016-06-05: 125 mL via INTRAVENOUS

## 2016-06-26 ENCOUNTER — Telehealth: Payer: Self-pay | Admitting: Internal Medicine

## 2016-06-26 ENCOUNTER — Encounter: Payer: Self-pay | Admitting: Gastroenterology

## 2016-06-26 NOTE — Telephone Encounter (Signed)
Pt called in and would like to go get a 2nd option on a knot she had found in her breast.  She is trying to get a breast reduction and needs to get this checked out [place] [phone]

## 2016-07-04 NOTE — Telephone Encounter (Signed)
Patient called in again about this. I spoke with Tara Holland some about it. We are asking her to call the doctors office and make sure she even needs a referral. She will call back to update [place]. Also if you have anything to add or know something else. Please follow up, Thank you.

## 2016-07-05 NOTE — Telephone Encounter (Signed)
She wants a referral for a knot to be looked at that she found in her breast. She wants breast reduction surgery and cannot have it until she gets the knot looked at.

## 2016-07-05 NOTE — Telephone Encounter (Signed)
Patient had a mammogram and an ultrasound at [facility]. The patient would like to go to [facility] for a second opinion. The knot was not seen on the mammogram or the ultrasound, but the patient and the technician both felt the knot on the right breast. Please send referral to [facility] for second opinion. That is where the patient would like to go.

## 2016-07-05 NOTE — Telephone Encounter (Signed)
We need the records from that mammogram. Also her insurance is not likely to cover another mammogram. It is not clear to me still. Did the plastic surgeon feel the knot and wants her to get evaluated? Does she have a gyn? Did she see her gyn as they ordered her mammogram last year in the computer. I cannot see the recent one in the computer except from last year.

## 2016-07-05 NOTE — Telephone Encounter (Signed)
Did they just feel the knot or she had a mammogram? I'm not sure if she wants a second opinion from another Psychiatric nurse. What kind of referral is she wanting? Does she have a gynecologist still because they ordered her mammogram from last year?

## 2016-07-05 NOTE — Telephone Encounter (Signed)
I"m not sure what she is asking for

## 2016-07-06 NOTE — Telephone Encounter (Signed)
Patient will schedule an office visit.  

## 2016-07-06 NOTE — Telephone Encounter (Signed)
I feel like she needs a visit as we are not getting the information we need about this.

## 2016-07-06 NOTE — Telephone Encounter (Signed)
Spoke with patient and she said her insurance will cover it because she checked. I will call to have the records faxed to [place].

## 2016-07-19 ENCOUNTER — Telehealth: Payer: Self-pay | Admitting: Emergency Medicine

## 2016-07-19 ENCOUNTER — Telehealth: Payer: Self-pay

## 2016-07-19 ENCOUNTER — Encounter: Payer: Self-pay | Admitting: Internal Medicine

## 2016-07-19 NOTE — Telephone Encounter (Signed)
Called patient, unable to reach. Medical records called and stated that patient was upset due to not being able to have her mammogram ordered. She stated that we told her she could go have it done. Medical records asked that I call patient and get it settled because she was going to them misunderstanding everything.

## 2016-07-19 NOTE — Telephone Encounter (Signed)
Pt called in and request her mammogram from 11/2015 that Dr Sharlet Salina sent her for. After looking and talking to referrals [facility] was called. [facility] stated the only thing on file was from 07/22/15 3D mammogram and [place] of right breast. Only other imaging she has had is a CT and Abd in 05/2016 that Dr Sharlet Salina referred her from at her Feb/2017 est care appt. Explained in detail to her that there was no referral placed for a knot in breast in 2017. Patient was never referred to [facility] either. Thanks.

## 2016-07-26 NOTE — Telephone Encounter (Signed)
Spoke with patient and she was looking for records. It was taken care of. Dr. Gareth Eagle office needed records. Patient said they got them.

## 2016-09-04 ENCOUNTER — Ambulatory Visit: Payer: Commercial Managed Care - HMO | Admitting: Gastroenterology

## 2017-01-08 ENCOUNTER — Emergency Department (HOSPITAL_BASED_OUTPATIENT_CLINIC_OR_DEPARTMENT_OTHER): Payer: Commercial Managed Care - HMO

## 2017-01-08 ENCOUNTER — Encounter (HOSPITAL_BASED_OUTPATIENT_CLINIC_OR_DEPARTMENT_OTHER): Payer: Self-pay

## 2017-01-08 ENCOUNTER — Telehealth: Payer: Self-pay | Admitting: Internal Medicine

## 2017-01-08 ENCOUNTER — Emergency Department (HOSPITAL_BASED_OUTPATIENT_CLINIC_OR_DEPARTMENT_OTHER)
Admission: EM | Admit: 2017-01-08 | Discharge: 2017-01-08 | Disposition: A | Discharge status: Home / Self Care | Payer: Commercial Managed Care - HMO | Attending: Emergency Medicine | Admitting: Emergency Medicine

## 2017-01-08 DIAGNOSIS — Z79899 Other long term (current) drug therapy: Secondary | ICD-10-CM | POA: Insufficient documentation

## 2017-01-08 DIAGNOSIS — B9789 Other viral agents as the cause of diseases classified elsewhere: Secondary | ICD-10-CM

## 2017-01-08 DIAGNOSIS — J209 Acute bronchitis, unspecified: Secondary | ICD-10-CM | POA: Insufficient documentation

## 2017-01-08 DIAGNOSIS — J069 Acute upper respiratory infection, unspecified: Secondary | ICD-10-CM | POA: Diagnosis not present

## 2017-01-08 DIAGNOSIS — I10 Essential (primary) hypertension: Secondary | ICD-10-CM | POA: Insufficient documentation

## 2017-01-08 DIAGNOSIS — R05 Cough: Secondary | ICD-10-CM | POA: Diagnosis present

## 2017-01-08 MED ORDER — OXYMETAZOLINE HCL 0.05 % NA SOLN
1.0000 | Freq: Once | NASAL | Status: AC
  Administered 2017-01-08: 1 via NASAL
  Filled 2017-01-08: qty 15

## 2017-01-08 MED ORDER — ALBUTEROL SULFATE (2.5 MG/3ML) 0.083% IN NEBU
2.5000 mg | INHALATION_SOLUTION | Freq: Once | RESPIRATORY_TRACT | Status: AC
  Administered 2017-01-08: 2.5 mg via RESPIRATORY_TRACT

## 2017-01-08 MED ORDER — AEROCHAMBER PLUS FLO-VU MEDIUM MISC
1.0000 | Freq: Once | Status: DC
  Filled 2017-01-08: qty 1

## 2017-01-08 MED ORDER — DM-GUAIFENESIN ER 30-600 MG PO TB12: 1.0000 | ORAL_TABLET | Freq: Two times a day (BID) | ORAL | 0 refills | Status: DC | PRN

## 2017-01-08 MED ORDER — ALBUTEROL SULFATE HFA 108 (90 BASE) MCG/ACT IN AERS
2.0000 | INHALATION_SPRAY | Freq: Once | RESPIRATORY_TRACT | Status: AC
  Administered 2017-01-08: 2 via RESPIRATORY_TRACT
  Filled 2017-01-08: qty 6.7

## 2017-01-08 MED ORDER — ALBUTEROL SULFATE (2.5 MG/3ML) 0.083% IN NEBU
INHALATION_SOLUTION | RESPIRATORY_TRACT | Status: AC
  Administered 2017-01-08: 2.5 mg via RESPIRATORY_TRACT
  Filled 2017-01-08: qty 3

## 2017-01-08 MED ORDER — IPRATROPIUM-ALBUTEROL 0.5-2.5 (3) MG/3ML IN SOLN
3.0000 mL | Freq: Once | RESPIRATORY_TRACT | Status: AC
  Administered 2017-01-08: 3 mL via RESPIRATORY_TRACT

## 2017-01-08 MED ORDER — IPRATROPIUM-ALBUTEROL 0.5-2.5 (3) MG/3ML IN SOLN
RESPIRATORY_TRACT | Status: AC
  Administered 2017-01-08: 3 mL via RESPIRATORY_TRACT
  Filled 2017-01-08: qty 3

## 2017-01-08 MED ORDER — FLUTICASONE PROPIONATE 50 MCG/ACT NA SUSP: 2.0000 | Freq: Every day | NASAL | 0 refills | Status: DC

## 2017-01-08 NOTE — ED Triage Notes (Signed)
c/o prod cough, wheezing, SOB x 1 week-NAD-steady gait

## 2017-01-08 NOTE — ED Provider Notes (Signed)
[facility] Provider Note   CSN: [id number] Arrival date & time: 01/08/17  1745  By signing my name below, I, Dolores Hoose, attest that this documentation has been prepared under the direction and in the presence of Leo Grosser, MD. Electronically Signed: Dolores Hoose, Scribe. 01/08/2017. 6:17 PM.  History   Chief Complaint Chief Complaint  Patient presents with  . Cough   The history is provided by the patient. No language interpreter was used.    HPI Comments:  Tara Holland is a 56 y.o. female with pmhx of HTN who presents to the Emergency Department complaining of frequent cough productive of beige sputum onset 7 days. Pt reports associated rhinorrhea, sinus pressure, wheezes and SOB. She has tried mucinex at home with minimal relief of symptoms. Pt denies any nausea or vomiting. P/o intake intact.   Past Medical History:  Diagnosis Date  . Hypertension   . Pain in joint, hand   . Pure hypercholesterolemia   . Vaginal irritation     Patient Active Problem List   Diagnosis Date Noted  . Abdominal pain 05/24/2016  . Spasm of vocal cords 11/11/2015  . VENTRICULAR HYPERTROPHY, LEFT 02/17/2009  . OBESITY 02/02/2009  . Essential hypertension 01/04/2009  . HYPERCHOLESTEROLEMIA 02/06/2008    Past Surgical History:  Procedure Laterality Date  . VAGINAL HYSTERECTOMY      OB History    No data available       Home Medications    Prior to Admission medications   Medication Sig Start Date End Date Taking? Authorizing Provider  amLODipine-benazepril (LOTREL) 5-10 MG capsule Take 1 capsule by mouth daily. 11/24/15   Hoyt Koch, MD    Family History Family History  Problem Relation Age of Onset  . Hypertension Mother   . Kidney disease Mother   . Kidney disease Brother     Social History Social History  Substance Use Topics  . Smoking status: Never Smoker  . Smokeless tobacco: Never Used  . Alcohol use No     Allergies   Sulfa  antibiotics and Sulfonamide derivatives   Review of Systems Review of Systems  HENT: Positive for rhinorrhea and sinus pressure.   Respiratory: Positive for shortness of breath and wheezing.   Gastrointestinal: Negative for nausea and vomiting.  All other systems reviewed and are negative.    Physical Exam Updated Vital Signs BP (!) 156/92 (BP Location: Left Arm)   Pulse 68   Temp 98.4 F (36.9 C) (Oral)   Resp 20   Ht 5\' 10"  (1.778 m)   Wt 206 lb (93.4 kg)   SpO2 100%   BMI 29.56 kg/m   Physical Exam  Constitutional: She is oriented to person, place, and time. She appears well-developed and well-nourished. No distress.  HENT:  Head: Normocephalic.  Nose: Nose normal.  Eyes: Conjunctivae are normal.  Neck: Neck supple. No tracheal deviation present.  Cardiovascular: Normal rate and regular rhythm.   Pulmonary/Chest: Effort normal. No respiratory distress.  Coarse bilateral wheeze throughout.   Abdominal: Soft. She exhibits no distension.  Neurological: She is alert and oriented to person, place, and time.  Skin: Skin is warm and dry.  Psychiatric: She has a normal mood and affect.     ED Treatments / Results  DIAGNOSTIC STUDIES:  Oxygen Saturation is 100% on RA, normal by my interpretation.    COORDINATION OF CARE:  7:37 PM Discussed treatment plan with pt at bedside which includes Afrin nasal spray, flonase and OTC symptomatic  management including Mucinex DM and pt agreed to plan. Discussed return precautions.   Labs (all labs ordered are listed, but only abnormal results are displayed) Labs Reviewed - No data to display  EKG  EKG Interpretation None       Radiology Dg Chest 2 View  Result Date: 01/08/2017 CLINICAL DATA:  Shortness of breath and cough EXAM: CHEST  2 VIEW COMPARISON:  None. FINDINGS: Lungs are clear. Heart size and pulmonary vascularity are normal. No adenopathy. No bone lesions. IMPRESSION: No edema or consolidation. Electronically  Signed   By: Lowella Grip III M.D.   On: 01/08/2017 18:54    Procedures Procedures (including critical care time)  Medications Ordered in ED Medications  ipratropium-albuterol (DUONEB) 0.5-2.5 (3) MG/3ML nebulizer solution 3 mL (3 mLs Nebulization Given 01/08/17 1802)  albuterol (PROVENTIL) (2.5 MG/3ML) 0.083% nebulizer solution 2.5 mg (2.5 mg Nebulization Given 01/08/17 1802)  oxymetazoline (AFRIN) 0.05 % nasal spray 1 spray (1 spray Each Nare Given 01/08/17 1946)  albuterol (PROVENTIL HFA;VENTOLIN HFA) 108 (90 Base) MCG/ACT inhaler 2 puff (2 puffs Inhalation Given 01/08/17 1955)     Initial Impression / Assessment and Plan / ED Course  I have reviewed the triage vital signs and the nursing notes.  Pertinent labs & imaging results that were available during my care of the patient were reviewed by me and considered in my medical decision making (see chart for details).     56 y.o. female presents with signs of acute bronchitis secondary to allergies versus viral illness. No PNA on CXR. Wheezing not improved with albuterol. No O2 requirement. Will treat with supportive care measures and anticipatory guidance discussed. Plan to follow up with PCP as needed and return precautions discussed for worsening or new concerning symptoms.   Final Clinical Impressions(s) / ED Diagnoses   Final diagnoses:  Acute bronchitis, unspecified organism  Viral URI with cough    New Prescriptions Discharge Medication List as of 01/08/2017  7:43 PM    START taking these medications   Details  dextromethorphan-guaiFENesin (MUCINEX DM) 30-600 MG 12hr tablet Take 1 tablet by mouth 2 (two) times daily as needed for cough., Starting Tue 01/08/2017, Print    fluticasone (FLONASE) 50 MCG/ACT nasal spray Place 2 sprays into both nostrils daily., Starting Tue 01/08/2017, Print      I personally performed the services described in this documentation, which was scribed in my presence. The recorded information has been  reviewed and is accurate.       Leo Grosser, MD 01/09/17 [id number]

## 2017-01-09 ENCOUNTER — Telehealth (HOSPITAL_BASED_OUTPATIENT_CLINIC_OR_DEPARTMENT_OTHER): Payer: Self-pay | Admitting: *Deleted

## 2017-01-09 NOTE — Telephone Encounter (Signed)
Pt called in regards to ED visit yesterday and needing note for work. Chart reviewed by Dr. Darl Householder and note provided per his VORB for pt to be out of work until Monday. Pt made aware she can pick note up at registration desk in ED.

## 2017-01-26 ENCOUNTER — Other Ambulatory Visit: Payer: Self-pay | Admitting: Internal Medicine

## 2017-03-15 ENCOUNTER — Ambulatory Visit: Payer: Commercial Managed Care - HMO | Admitting: Internal Medicine

## 2017-04-01 ENCOUNTER — Ambulatory Visit: Payer: Commercial Managed Care - HMO | Admitting: Nurse Practitioner

## 2017-04-01 ENCOUNTER — Ambulatory Visit: Payer: Commercial Managed Care - HMO | Admitting: Internal Medicine

## 2017-06-26 ENCOUNTER — Other Ambulatory Visit: Payer: Self-pay | Admitting: Internal Medicine

## 2017-08-01 ENCOUNTER — Ambulatory Visit (INDEPENDENT_AMBULATORY_CARE_PROVIDER_SITE_OTHER): Payer: 59 | Admitting: Internal Medicine

## 2017-08-01 ENCOUNTER — Encounter: Payer: Self-pay | Admitting: Internal Medicine

## 2017-08-01 ENCOUNTER — Other Ambulatory Visit: Payer: 59

## 2017-08-01 VITALS — BP 168/84 | HR 91 | Temp 98.2°F | Ht 70.0 in | Wt 204.0 lb

## 2017-08-01 DIAGNOSIS — I10 Essential (primary) hypertension: Secondary | ICD-10-CM | POA: Diagnosis not present

## 2017-08-01 DIAGNOSIS — Z0000 Encounter for general adult medical examination without abnormal findings: Secondary | ICD-10-CM | POA: Diagnosis not present

## 2017-08-01 NOTE — Progress Notes (Signed)
   Subjective:    Patient ID: Tara Holland, female    DOB: 1961/04/30, 56 y.o.   MRN: [id number]  HPI The patient is a 56 YO female coming in for headaches. She has been out of her blood pressure medication due to no follow up. She has just gotten her medication and taken it for 1 day but had been out for several weeks. She has been having headaches during this time. Denies chest pains or SOB. She denies abdominal pain or nausea or vomiting.   Review of Systems  Constitutional: Negative.   HENT: Negative.   Eyes: Negative.   Respiratory: Negative for cough, chest tightness and shortness of breath.   Cardiovascular: Negative for chest pain, palpitations and leg swelling.  Gastrointestinal: Negative for abdominal distention, abdominal pain, constipation, diarrhea, nausea and vomiting.  Musculoskeletal: Negative.   Skin: Negative.   Neurological: Positive for headaches.  Psychiatric/Behavioral: Negative.       Objective:   Physical Exam  Constitutional: She is oriented to person, place, and time. She appears well-developed and well-nourished.  HENT:  Head: Normocephalic and atraumatic.  Eyes: Pupils are equal, round, and reactive to light. EOM are normal.  Neck: Normal range of motion.  Cardiovascular: Normal rate and regular rhythm.   Pulmonary/Chest: Effort normal and breath sounds normal. No respiratory distress. She has no wheezes. She has no rales.  Abdominal: Soft. Bowel sounds are normal. She exhibits no distension. There is no tenderness. There is no rebound.  Musculoskeletal: She exhibits no edema.  Neurological: She is alert and oriented to person, place, and time. Coordination normal.  Skin: Skin is warm and dry.   Vitals:   08/01/17 1529 08/01/17 1535  BP: (!) 162/92 (!) 168/84  Pulse: 91   Temp: 98.2 F (36.8 C)   TempSrc: Oral   SpO2: 99%   Weight: 204 lb (92.5 kg)   Height: 5\' 10"  (1.778 m)       Assessment & Plan:

## 2017-08-01 NOTE — Patient Instructions (Signed)
We are checking the labs today.   Give the blood pressure medicine about 2 weeks to get in your system.   Tara Holland has a [facility] [place] which is transitional housing for the recently homeless.

## 2017-08-02 LAB — CBC WITH DIFFERENTIAL/PLATELET
Basophils Absolute: 0 10*3/uL (ref 0.0–0.2)
Basos: 0 %
EOS (ABSOLUTE): 0.2 10*3/uL (ref 0.0–0.4)
EOS: 2 %
HEMATOCRIT: 41.4 % (ref 34.0–46.6)
HEMOGLOBIN: 13.2 g/dL (ref 11.1–15.9)
IMMATURE GRANULOCYTES: 0 %
Immature Grans (Abs): 0 10*3/uL (ref 0.0–0.1)
LYMPHS ABS: 2.5 10*3/uL (ref 0.7–3.1)
Lymphs: 26 %
MCH: 28.4 pg (ref 26.6–33.0)
MCHC: 31.9 g/dL (ref 31.5–35.7)
MCV: 89 fL (ref 79–97)
MONOCYTES: 4 %
Monocytes Absolute: 0.4 10*3/uL (ref 0.1–0.9)
Neutrophils Absolute: 6.6 10*3/uL (ref 1.4–7.0)
Neutrophils: 68 %
Platelets: 255 10*3/uL (ref 150–379)
RBC: 4.65 x10E6/uL (ref 3.77–5.28)
RDW: 13.4 % (ref 12.3–15.4)
WBC: 9.6 10*3/uL (ref 3.4–10.8)

## 2017-08-02 LAB — COMPREHENSIVE METABOLIC PANEL
ALBUMIN: 3.9 g/dL (ref 3.5–5.5)
ALK PHOS: 93 IU/L (ref 39–117)
ALT: 14 IU/L (ref 0–32)
AST: 20 IU/L (ref 0–40)
Albumin/Globulin Ratio: 1.2 (ref 1.2–2.2)
BUN/Creatinine Ratio: 11 (ref 9–23)
BUN: 14 mg/dL (ref 6–24)
Bilirubin Total: 0.3 mg/dL (ref 0.0–1.2)
CALCIUM: 9.7 mg/dL (ref 8.7–10.2)
CO2: 29 mmol/L (ref 20–29)
CREATININE: 1.31 mg/dL — AB (ref 0.57–1.00)
Chloride: 100 mmol/L (ref 96–106)
GFR calc Af Amer: 52 mL/min/{1.73_m2} — ABNORMAL LOW (ref >59)
GFR, EST NON AFRICAN AMERICAN: 45 mL/min/{1.73_m2} — AB (ref >59)
GLOBULIN, TOTAL: 3.2 g/dL (ref 1.5–4.5)
Glucose: 185 mg/dL — ABNORMAL HIGH (ref 65–99)
Potassium: 3.2 mmol/L — ABNORMAL LOW (ref 3.5–5.2)
Sodium: 143 mmol/L (ref 134–144)
Total Protein: 7.1 g/dL (ref 6.0–8.5)

## 2017-08-02 LAB — LIPID PANEL
CHOL/HDL RATIO: 4.5 ratio — AB (ref 0.0–4.4)
CHOLESTEROL TOTAL: 218 mg/dL — AB (ref 100–199)
HDL: 48 mg/dL (ref >39)
LDL Calculated: 143 mg/dL — ABNORMAL HIGH (ref 0–99)
TRIGLYCERIDES: 133 mg/dL (ref 0–149)
VLDL CHOLESTEROL CAL: 27 mg/dL (ref 5–40)

## 2017-08-02 LAB — HEMOGLOBIN A1C
Est. average glucose Bld gHb Est-mCnc: 140 mg/dL
Hgb A1c MFr Bld: 6.5 % — ABNORMAL HIGH (ref 4.8–5.6)

## 2017-08-02 NOTE — Assessment & Plan Note (Signed)
BP previously at goal on her amlodipine/benazepril and refilled today. Checking CMP and adjust as needed. Follow up soon if BP still elevated at home for adjustment.

## 2017-08-26 ENCOUNTER — Other Ambulatory Visit: Payer: Self-pay | Admitting: Internal Medicine

## 2017-09-11 NOTE — Telephone Encounter (Signed)
error 

## 2017-11-27 ENCOUNTER — Encounter (HOSPITAL_BASED_OUTPATIENT_CLINIC_OR_DEPARTMENT_OTHER): Payer: Self-pay

## 2017-11-27 ENCOUNTER — Observation Stay (HOSPITAL_BASED_OUTPATIENT_CLINIC_OR_DEPARTMENT_OTHER)
Admission: EM | Admit: 2017-11-27 | Discharge: 2017-11-28 | Disposition: A | Discharge status: Home / Self Care | Payer: 59 | Attending: Internal Medicine | Admitting: Internal Medicine

## 2017-11-27 ENCOUNTER — Other Ambulatory Visit: Payer: Self-pay

## 2017-11-27 ENCOUNTER — Emergency Department (HOSPITAL_BASED_OUTPATIENT_CLINIC_OR_DEPARTMENT_OTHER): Payer: 59

## 2017-11-27 ENCOUNTER — Observation Stay (HOSPITAL_COMMUNITY): Payer: 59

## 2017-11-27 DIAGNOSIS — R200 Anesthesia of skin: Secondary | ICD-10-CM | POA: Insufficient documentation

## 2017-11-27 DIAGNOSIS — E1165 Type 2 diabetes mellitus with hyperglycemia: Secondary | ICD-10-CM | POA: Diagnosis not present

## 2017-11-27 DIAGNOSIS — Z79899 Other long term (current) drug therapy: Secondary | ICD-10-CM | POA: Diagnosis not present

## 2017-11-27 DIAGNOSIS — R29818 Other symptoms and signs involving the nervous system: Secondary | ICD-10-CM | POA: Insufficient documentation

## 2017-11-27 DIAGNOSIS — R739 Hyperglycemia, unspecified: Secondary | ICD-10-CM | POA: Diagnosis present

## 2017-11-27 DIAGNOSIS — I10 Essential (primary) hypertension: Principal | ICD-10-CM | POA: Diagnosis present

## 2017-11-27 DIAGNOSIS — R29810 Facial weakness: Secondary | ICD-10-CM | POA: Diagnosis present

## 2017-11-27 DIAGNOSIS — I639 Cerebral infarction, unspecified: Secondary | ICD-10-CM | POA: Diagnosis present

## 2017-11-27 DIAGNOSIS — E7800 Pure hypercholesterolemia, unspecified: Secondary | ICD-10-CM | POA: Diagnosis not present

## 2017-11-27 DIAGNOSIS — E119 Type 2 diabetes mellitus without complications: Secondary | ICD-10-CM

## 2017-11-27 DIAGNOSIS — E669 Obesity, unspecified: Secondary | ICD-10-CM

## 2017-11-27 DIAGNOSIS — R2990 Unspecified symptoms and signs involving the nervous system: Secondary | ICD-10-CM

## 2017-11-27 LAB — DIFFERENTIAL
Basophils Absolute: 0 10*3/uL (ref 0.0–0.1)
Basophils Relative: 0 %
EOS PCT: 2 %
Eosinophils Absolute: 0.2 10*3/uL (ref 0.0–0.7)
LYMPHS PCT: 33 %
Lymphs Abs: 2.5 10*3/uL (ref 0.7–4.0)
MONO ABS: 0.5 10*3/uL (ref 0.1–1.0)
MONOS PCT: 7 %
Neutro Abs: 4.4 10*3/uL (ref 1.7–7.7)
Neutrophils Relative %: 58 %

## 2017-11-27 LAB — RAPID URINE DRUG SCREEN, HOSP PERFORMED
Amphetamines: NOT DETECTED
Barbiturates: NOT DETECTED
Benzodiazepines: NOT DETECTED
COCAINE: NOT DETECTED
Opiates: NOT DETECTED
Tetrahydrocannabinol: NOT DETECTED

## 2017-11-27 LAB — URINALYSIS, ROUTINE W REFLEX MICROSCOPIC
Bilirubin Urine: NEGATIVE
GLUCOSE, UA: NEGATIVE mg/dL
Hgb urine dipstick: NEGATIVE
KETONES UR: NEGATIVE mg/dL
Leukocytes, UA: NEGATIVE
NITRITE: NEGATIVE
PH: 7 (ref 5.0–8.0)
Protein, ur: NEGATIVE mg/dL
SPECIFIC GRAVITY, URINE: 1.015 (ref 1.005–1.030)

## 2017-11-27 LAB — COMPREHENSIVE METABOLIC PANEL
ALK PHOS: 80 U/L (ref 38–126)
ALT: 21 U/L (ref 14–54)
ANION GAP: 7 (ref 5–15)
AST: 21 U/L (ref 15–41)
Albumin: 3.4 g/dL — ABNORMAL LOW (ref 3.5–5.0)
BUN: 18 mg/dL (ref 6–20)
CALCIUM: 8.8 mg/dL — AB (ref 8.9–10.3)
CO2: 27 mmol/L (ref 22–32)
CREATININE: 1.09 mg/dL — AB (ref 0.44–1.00)
Chloride: 107 mmol/L (ref 101–111)
GFR, EST NON AFRICAN AMERICAN: 55 mL/min — AB (ref >60)
Glucose, Bld: 122 mg/dL — ABNORMAL HIGH (ref 65–99)
Potassium: 3.4 mmol/L — ABNORMAL LOW (ref 3.5–5.1)
Sodium: 141 mmol/L (ref 135–145)
TOTAL PROTEIN: 7 g/dL (ref 6.5–8.1)
Total Bilirubin: 0.7 mg/dL (ref 0.3–1.2)

## 2017-11-27 LAB — CBC
HEMATOCRIT: 39.8 % (ref 36.0–46.0)
Hemoglobin: 12.5 g/dL (ref 12.0–15.0)
MCH: 28.8 pg (ref 26.0–34.0)
MCHC: 31.4 g/dL (ref 30.0–36.0)
MCV: 91.7 fL (ref 78.0–100.0)
Platelets: 230 10*3/uL (ref 150–400)
RBC: 4.34 MIL/uL (ref 3.87–5.11)
RDW: 14 % (ref 11.5–15.5)
WBC: 7.7 10*3/uL (ref 4.0–10.5)

## 2017-11-27 LAB — TROPONIN I

## 2017-11-27 LAB — APTT: aPTT: 30 seconds (ref 24–36)

## 2017-11-27 LAB — ETHANOL

## 2017-11-27 LAB — PROTIME-INR
INR: 0.95
PROTHROMBIN TIME: 12.6 s (ref 11.4–15.2)

## 2017-11-27 LAB — PREGNANCY, URINE: Preg Test, Ur: NEGATIVE

## 2017-11-27 MED ORDER — ATORVASTATIN CALCIUM 10 MG PO TABS
20.0000 mg | ORAL_TABLET | Freq: Every day | ORAL | Status: DC
  Filled 2017-11-27: qty 2

## 2017-11-27 MED ORDER — ASPIRIN 300 MG RE SUPP: 300.0000 mg | Freq: Every day | RECTAL | Status: DC

## 2017-11-27 MED ORDER — ACETAMINOPHEN 650 MG RE SUPP: 650.0000 mg | RECTAL | Status: DC | PRN

## 2017-11-27 MED ORDER — SODIUM CHLORIDE 0.9 % IV SOLN
INTRAVENOUS | Status: DC
  Administered 2017-11-27: 18:00:00 via INTRAVENOUS

## 2017-11-27 MED ORDER — ASPIRIN 325 MG PO TABS
325.0000 mg | ORAL_TABLET | Freq: Every day | ORAL | Status: DC
  Administered 2017-11-27 – 2017-11-28 (×2): 325 mg via ORAL
  Filled 2017-11-27 (×2): qty 1

## 2017-11-27 MED ORDER — STROKE: EARLY STAGES OF RECOVERY BOOK
Freq: Once | Status: AC
  Administered 2017-11-27: 18:00:00

## 2017-11-27 MED ORDER — ENOXAPARIN SODIUM 40 MG/0.4ML ~~LOC~~ SOLN
40.0000 mg | SUBCUTANEOUS | Status: DC
  Administered 2017-11-27: 40 mg via SUBCUTANEOUS
  Filled 2017-11-27: qty 0.4

## 2017-11-27 MED ORDER — ACETAMINOPHEN 325 MG PO TABS: 650.0000 mg | ORAL_TABLET | ORAL | Status: DC | PRN

## 2017-11-27 MED ORDER — SENNOSIDES-DOCUSATE SODIUM 8.6-50 MG PO TABS: 1.0000 | ORAL_TABLET | Freq: Every evening | ORAL | Status: DC | PRN

## 2017-11-27 MED ORDER — ACETAMINOPHEN 160 MG/5ML PO SOLN: 650.0000 mg | ORAL | Status: DC | PRN

## 2017-11-27 MED ORDER — LABETALOL HCL 5 MG/ML IV SOLN
5.0000 mg | Freq: Once | INTRAVENOUS | Status: DC
  Filled 2017-11-27: qty 4

## 2017-11-27 MED ORDER — POTASSIUM CHLORIDE CRYS ER 20 MEQ PO TBCR
20.0000 meq | EXTENDED_RELEASE_TABLET | Freq: Once | ORAL | Status: AC
  Administered 2017-11-27: 20 meq via ORAL
  Filled 2017-11-27: qty 1

## 2017-11-27 NOTE — ED Notes (Signed)
TeleNeuro to bedside for eval.

## 2017-11-27 NOTE — ED Notes (Signed)
Up to BR tolerated well.  Denies pain,

## 2017-11-27 NOTE — ED Provider Notes (Signed)
[place] EMERGENCY DEPARTMENT Provider Note   CSN: [id number] Arrival date & time: 11/27/17  0500     History   Chief Complaint Chief Complaint  Patient presents with  . Facial Droop    HPI Tara Holland is a 57 y.o. female.  Patient presents to the emergency department for evaluation of left facial pulling and possibly drooping.  Patient reports that when she awakened this morning she noticed that the left side of her face was "drawing".  She went to bed around 10 or 1030 last night, did not have any symptoms at that time.  Patient does report that she has some numbness in her arm as well.  She does, however, report that she often has this and thinks he might have carpal tunnel in the left wrist.  She does not think that the numbness in her arm is changed.  She has not had any leg involvement.  No speech disturbance.  No drooling.      Past Medical History:  Diagnosis Date  . Hypertension   . Pain in joint, hand   . Pure hypercholesterolemia   . Vaginal irritation     Patient Active Problem List   Diagnosis Date Noted  . Abdominal pain 05/24/2016  . Spasm of vocal cords 11/11/2015  . VENTRICULAR HYPERTROPHY, LEFT 02/17/2009  . OBESITY 02/02/2009  . Essential hypertension 01/04/2009  . HYPERCHOLESTEROLEMIA 02/06/2008    Past Surgical History:  Procedure Laterality Date  . VAGINAL HYSTERECTOMY      OB History    No data available       Home Medications    Prior to Admission medications   Medication Sig Start Date End Date Taking? Authorizing Provider  amLODipine-benazepril (LOTREL) 5-10 MG capsule TAKE ONE CAPSULE BY MOUTH ONE TIME DAILY  08/26/17   Hoyt Koch, MD    Family History Family History  Problem Relation Age of Onset  . Hypertension Mother   . Kidney disease Mother   . Kidney disease Brother     Social History Social History   Tobacco Use  . Smoking status: Never Smoker  . Smokeless tobacco: Never Used    Substance Use Topics  . Alcohol use: No  . Drug use: No     Allergies   Sulfa antibiotics and Sulfonamide derivatives   Review of Systems Review of Systems  Neurological: Negative for speech difficulty.       Left facial numbness. Left arm numbness  All other systems reviewed and are negative.    Physical Exam Updated Vital Signs BP (!) 202/110   Pulse (!) 53   Temp 98.3 F (36.8 C)   Resp 17   Ht 5\' 10"  (1.778 m)   Wt 92.5 kg (204 lb)   SpO2 99%   BMI 29.27 kg/m   Physical Exam  Constitutional: She is oriented to person, place, and time. She appears well-developed and well-nourished. No distress.  HENT:  Head: Normocephalic and atraumatic.  Right Ear: Hearing normal.  Left Ear: Hearing normal.  Nose: Nose normal.  Mouth/Throat: Oropharynx is clear and moist and mucous membranes are normal.  Eyes: Conjunctivae and EOM are normal. Pupils are equal, round, and reactive to light.  Neck: Normal range of motion. Neck supple.  Cardiovascular: Regular rhythm, S1 normal and S2 normal. Exam reveals no gallop and no friction rub.  No murmur heard. Pulmonary/Chest: Effort normal and breath sounds normal. No respiratory distress. She exhibits no tenderness.  Abdominal: Soft. Normal appearance and bowel  sounds are normal. There is no hepatosplenomegaly. There is no tenderness. There is no rebound, no guarding, no tenderness at McBurney's point and negative Murphy's sign. No hernia.  Musculoskeletal: Normal range of motion.  Neurological: She is alert and oriented to person, place, and time. She has normal strength. No cranial nerve deficit or sensory deficit. Coordination normal. GCS eye subscore is 4. GCS verbal subscore is 5. GCS motor subscore is 6.  Extraocular muscle movement: normal No visual field cut Pupils: equal and reactive both direct and consensual response is normal No nystagmus present    Sensory function is intact to light touch, pinprick Proprioception  intact  Grip strength 5/5 symmetric in upper extremities No pronator drift Normal finger to nose bilaterally  Lower extremity strength 5/5 against gravity Normal heel to shin bilaterally  Gait: normal   Skin: Skin is warm, dry and intact. No rash noted. No cyanosis.  Psychiatric: She has a normal mood and affect. Her speech is normal and behavior is normal. Thought content normal.  Nursing note and vitals reviewed.    ED Treatments / Results  Labs (all labs ordered are listed, but only abnormal results are displayed) Labs Reviewed  COMPREHENSIVE METABOLIC PANEL - Abnormal; Notable for the following components:      Result Value   Potassium 3.4 (*)    Glucose, Bld 122 (*)    Creatinine, Ser 1.09 (*)    Calcium 8.8 (*)    Albumin 3.4 (*)    GFR calc non Af Amer 55 (*)    All other components within normal limits  URINALYSIS, ROUTINE W REFLEX MICROSCOPIC - Abnormal; Notable for the following components:   Color, Urine STRAW (*)    APPearance HAZY (*)    All other components within normal limits  ETHANOL  PROTIME-INR  APTT  CBC  DIFFERENTIAL  TROPONIN I  RAPID URINE DRUG SCREEN, HOSP PERFORMED  PREGNANCY, URINE    EKG  EKG Interpretation None       Radiology Ct Head Wo Contrast  Result Date: 11/27/2017 CLINICAL DATA:  Focal neurologic deficit greater than 6 hours, stroke suspected. EXAM: CT HEAD WITHOUT CONTRAST TECHNIQUE: Contiguous axial images were obtained from the base of the skull through the vertex without intravenous contrast. COMPARISON:  None. FINDINGS: Brain: No intracranial hemorrhage, mass effect, or midline shift. No hydrocephalus. The basilar cisterns are patent. No evidence of territorial infarct or acute ischemia. No extra-axial or intracranial fluid collection. Vascular: No hyperdense vessel or unexpected calcification. Skull: No fracture or focal lesion. Sinuses/Orbits: Paranasal sinuses and mastoid air cells are clear. The visualized orbits are  unremarkable. Other: None. IMPRESSION: Negative noncontrast head CT. Electronically Signed   By: Jeb Levering M.D.   On: 11/27/2017 05:57    Procedures Procedures (including critical care time)  Medications Ordered in ED Medications  labetalol (NORMODYNE,TRANDATE) injection 5 mg (not administered)     Initial Impression / Assessment and Plan / ED Course  I have reviewed the triage vital signs and the nursing notes.  Pertinent labs & imaging results that were available during my care of the patient were reviewed by me and considered in my medical decision making (see chart for details).     Patient presents to the emergency department for evaluation of left facial numbness and droop.  Patient went to bed last night feeling fine but when she awakened this morning she noticed that the left side of her face felt like it was "drawing up".  She also had  some left arm numbness, but qualifies this stating that this happens frequently and she has been attributing this to possible carpal tunnel.  She does, however, have cardiovascular risk factors and has a strong family history of stroke.  Since the onset of symptoms was unknown (she awakened from sleep with symptoms, having gone to bed 8 hours prior) she was not a candidate for a code stroke.  Workup has been negative thus far.  Neurology consultation has been obtained and it is recommended that the patient be transferred for admission for full stroke workup.  Final Clinical Impressions(s) / ED Diagnoses   Final diagnoses:  Neurosensory deficit    ED Discharge Orders    None       Orpah Greek, MD 11/27/17 [phone]

## 2017-11-27 NOTE — ED Notes (Signed)
ED Provider at bedside. 

## 2017-11-27 NOTE — ED Notes (Signed)
Patient transported to CT 

## 2017-11-27 NOTE — ED Notes (Signed)
Pt reports several times over the last year she would be walking and drift to the left side, denies being seen for symptoms after those events.

## 2017-11-27 NOTE — ED Notes (Signed)
Moves all extremeties  Alert  Oriented

## 2017-11-27 NOTE — ED Triage Notes (Signed)
Pt reports the left side of her face "drooping" with left arm numbness. Onset 56mins PTA.

## 2017-11-27 NOTE — ED Notes (Signed)
Remains alert and oriented  Moves all extremeties .   Tolerating po flds

## 2017-11-27 NOTE — Progress Notes (Signed)
Pt admitted to 3W27 from [facility]  Alert and oriented. C/o a slight headache but does not want anything to take.  She is anxious for a diet order.  MD notified patient on floor.  Bed alarm on, call bell within reach and patient verbalizes understanding to call before getting OOB.  Tele on patient and CCMD called and verified.

## 2017-11-27 NOTE — ED Notes (Signed)
Pt reports she has been out of her BP medications x 3 days

## 2017-11-27 NOTE — H&P (Signed)
History and Physical    Tara Holland CWC:[id number] DOB: 1960/12/06 DOA: 11/27/2017  PCP: Hoyt Koch, MD Consultants:  None Patient coming from:  Home - lives alone; NOK: sister Judeen Hammans, [phone]  Chief Complaint: left facial droop  HPI: Tara Holland is a 57 y.o. female with medical history significant of HTN with medication non-compliance and HLD not taking medication for this presenting with left facial droop.  Patient has "been having episodes" - had two separate episodes where she couldn't walk straight and was leaning to the left.  Today, she woke up in the early AM and felt like her face was pulling to the left.  Since she was unable to rest, she decided to go to the ER.  She did not have any other symptoms.  Facial droop lasted maybe 10 seconds and she did not have further issue - but she feels like the left side of her face is tighter than the right.  She has noticed that sensation of tightness on the left in the past.  No speech difficulty or swallow dysfunction.  She does get choked a lot when she eats.  No vision changes other than chronically with her BP fluctuation.  Her left arm always feels like it gets numb, but she thinks that might be because of how she sleeps; at one point she started thinking it was her heart.  Mother had a stroke at age 26 and her brother developed ESRD from poorly controlled HTN.   ED Course: Seen at [facility] - facial droop with CVD RF including +FH.  No Code Stroke due to uncertain length of time for symptoms.  Negative evaluation in ER.  Teleneurology recommended transfer for further CVA evaluation.  Review of Systems: As per HPI; otherwise review of systems reviewed and negative.   Ambulatory Status:  Ambulates without assistance  Past Medical History:  Diagnosis Date  . Hypertension   . Pain in joint, hand   . Pure hypercholesterolemia   . Vaginal irritation     Past Surgical History:  Procedure Laterality Date  . BREAST LUMPECTOMY      . VAGINAL HYSTERECTOMY      Social History   Socioeconomic History  . Marital status: Single    Spouse name: Not on file  . Number of children: Not on file  . Years of education: Not on file  . Highest education level: Not on file  Social Needs  . Financial resource strain: Not on file  . Food insecurity - worry: Not on file  . Food insecurity - inability: Not on file  . Transportation needs - medical: Not on file  . Transportation needs - non-medical: Not on file  Occupational History  . Occupation: Sales promotion account executive  Tobacco Use  . Smoking status: Never Smoker  . Smokeless tobacco: Never Used  Substance and Sexual Activity  . Alcohol use: No  . Drug use: No  . Sexual activity: Not on file  Other Topics Concern  . Not on file  Social History Narrative  . Not on file    Allergies  Allergen Reactions  . Sulfonamide Derivatives Itching    Severe itching    Family History  Problem Relation Age of Onset  . Hypertension Mother   . Kidney disease Mother   . CVA Mother 69  . Kidney disease Brother     Prior to Admission medications   Medication Sig Start Date End Date Taking? Authorizing Provider  amLODipine-benazepril (LOTREL) 5-10 MG capsule TAKE ONE CAPSULE  BY MOUTH ONE TIME DAILY  08/26/17  Yes Hoyt Koch, MD    Physical Exam: Vitals:   11/27/17 1200 11/27/17 1230 11/27/17 1300 11/27/17 1500  BP: (!) 186/110 (!) 188/103 (!) 187/106 (!) 167/93  Pulse: 72 70 (!) 58 (!) 57  Resp: 16 15 18 16   Temp:    98.3 F (36.8 C)  TempSrc:    Oral  SpO2: 96% 99% 97% 98%  Weight:      Height:         General:  Appears calm and comfortable and is NAD Eyes:   EOMI, normal lids, iris ENT:  grossly normal hearing, lips & tongue, mmm; appropriate dentition Neck:  no LAD, masses or thyromegaly; no carotid bruits Cardiovascular:  RRR, no m/r/g. No LE edema.  Respiratory:   CTA bilaterally with no wheezes/rales/rhonchi.  Normal respiratory effort. Abdomen:  soft,  NT, ND, NABS Back:   normal alignment, no CVAT Skin:  no rash or induration seen on limited exam Musculoskeletal:  grossly normal tone BUE/BLE, good ROM, no bony abnormality Psychiatric:  grossly normal mood and affect, speech fluent and appropriate, AOx3 Neurologic:  CN 2-12 grossly intact, moves all extremities in coordinated fashion, sensation intact    Radiological Exams on Admission: Ct Head Wo Contrast  Result Date: 11/27/2017 CLINICAL DATA:  Focal neurologic deficit greater than 6 hours, stroke suspected. EXAM: CT HEAD WITHOUT CONTRAST TECHNIQUE: Contiguous axial images were obtained from the base of the skull through the vertex without intravenous contrast. COMPARISON:  None. FINDINGS: Brain: No intracranial hemorrhage, mass effect, or midline shift. No hydrocephalus. The basilar cisterns are patent. No evidence of territorial infarct or acute ischemia. No extra-axial or intracranial fluid collection. Vascular: No hyperdense vessel or unexpected calcification. Skull: No fracture or focal lesion. Sinuses/Orbits: Paranasal sinuses and mastoid air cells are clear. The visualized orbits are unremarkable. Other: None. IMPRESSION: Negative noncontrast head CT. Electronically Signed   By: Jeb Levering M.D.   On: 11/27/2017 05:57    EKG: pending   Labs on Admission: I have personally reviewed the available labs and imaging studies at the time of the admission.  Pertinent labs:   K+ 3.4 Glucose 122; 185 in 10/18 Troponin <0.03 Cholesterol 08/01/17: 218/48/143/133 Normal CBC INR 0.95 Essentially normal UA Negative upreg UDS negative  ETOH negative  Assessment/Plan Principal Problem:   Malignant hypertension Active Problems:   HYPERCHOLESTEROLEMIA   Facial droop   Hyperglycemia   Malignant HTN -Patient presenting to [facility] with L facial droop (resolved before being seen), found to have markedly elevated BPs -Patient with known HTN, has reportedly been out of medications for  several days -BP was as high as 202/114, currently 167/93 -No medication at this time for permissive HTN (see below), but will need improved BP control at the time of d/c and ongoing compliance with medications -She takes Amlodipine//benazepril at home and it is likely reasonable to resume this medication if MRI is negative  Facial droop -Patient had very transient episode of reported facial droop, not visible to ER physician -Teleneurology note also reports LUE drift although this was not appreciated on exam by ER provider or on admission -As a result, suspicion for CVA or even TIA is fairly low -Will obtain MRI/MRA; if negative, would not suggest further neurology consult or Echo/dopplers -Anticipate that patient will be appropriate for d/c to home in AM (or after MRI is read)  HLD -Will start Lipitor 20 mg daily based on prior lipid panel results -Will recheck  lipid panel  Hyperglycemia -May be stress response - but prior value was also elevated and so suspect prediabetes or new-onset type 2 DM -Will follow with fasting AM labs and check A1c  DVT prophylaxis:  Lovenox  Code Status:  Full - confirmed with patient/family Family Communication: Sisters present for the second half of the evaluation  Disposition Plan:  Home once clinically improved Consults called: PT/OT/ST/CM; teleneurology  Admission status: It is my clinical opinion that referral for OBSERVATION is reasonable and necessary in this patient based on the above information provided. The aforementioned taken together are felt to place the patient at high risk for further clinical deterioration. However it is anticipated that the patient may be medically stable for discharge from the hospital within 24 to 48 hours.    Karmen Bongo MD [facility]  If note is complete, please contact covering daytime or nighttime physician. [email or web address] Password [email or web address]  11/27/2017, 5:00 PM

## 2017-11-27 NOTE — ED Notes (Signed)
CT delayed due to other pt in CT

## 2017-11-27 NOTE — ED Notes (Signed)
Pt resting comfortably on stretcher and in NAD at this time.

## 2017-11-27 NOTE — Consult Note (Signed)
[facility] TeleNeurology Consult Services  Patient was informed the Neurology Consult would happen via telehealth (remote video) and consented to receiving care in this manner.  Impression: Stroke -patient presents c/o waking with facial droop that has persisted. Also uncontrolled HTN on arrival to the ED. On exam, she has a right facial droop and  A LUE drift. Presentation concerning for acute stroke, small vessel etiology. She is outside the therapeutic window for IV tPA.  Not a tpa candidate due to: LSN > 4.5 hours  Not an NIR candidate due to: presentation not consistent with LVO  Differential Diagnosis:HTN urgency  Comments:  [facility] contacted: 6:10  [facility] at bedside: 6:16  NIHSS assessment time: 6:21  Recommendations: Admit for further work-up Antiplatelet therapy with Aspirin 325 mg  Further inpatient evaluation as per Neurology/ Internal Medicine Discussed with ED MD  History of Present Illness  Patient is a 57 years old woman with history of HTN who presents to the ED c/o of her face pulling to the left since waking up this morning. She was LSN last night at 10pm before going to bed. The symptoms have persisted since then. She also c/o left arm numbness which is chronic. On arrival to the ED she was found with uncontrolled BP. She reports she ran out of antihypertensives a few days ago. Family history of strokes in her brother at the age of 78 and mother at 7. Does not take antiplatelet.  Exam  NIHSS score: 2 1A: Level of Consciousness - Alert; keenly responsive 1B: Ask Month and Age - Both Questions Right 1C: 'Blink Eyes' & 'Squeeze Hands' - Performs Both Tasks 2: Test Horizontal Extraocular Movements - Normal 3: Test Visual Fields - No Visual Loss 4: Test Facial Palsy - Minor paralysis (flat nasolabial fold, smile asymetry)5A: Test Left Arm Motor Drift - Drift, but doesn't hit bed 5B: Test Right Arm Motor Drift - No Drift for 10 Seconds6A: Test Left  Leg Motor Drift - No Drift for 5 Seconds 6B: Test Right Leg Motor Drift - No Drift for 5 Seconds 7: Test Limb Ataxia - No Ataxia 8: Test Sensation - Normal; No sensory loss 9: Test Language/Aphasia - Normal; No aphasia 10: Test Dysarthria - Normal 11: Test Extinction/Inattention - No abnormality   Noncontrast CTH: no evidence of bleeding or acute ischemia  Medical Decision Making:  - Extensive number of diagnosis or management options are considered above.  - Extensive amount of complex data reviewed.  - High risk of complication and/or morbidity or mortality are associated with differential diagnostic considerations above.  - There may be Uncertain outcome and increased probability of prolonged functional impairment or high probability of severe prolonged functional impairment associated with some of these differential diagnosis.  Medical Data Reviewed:  1.Data reviewed include clinical labs, radiology, Medical Tests;  2.Tests results discussed w/performing or interpreting physician;  3.Obtaining/reviewing old medical records;  4.Obtaining case history from another source;  5.Independent review of image, tracing or specimen.

## 2017-11-27 NOTE — ED Notes (Signed)
Labetalol not given at this time due to pt HR <50

## 2017-11-27 NOTE — ED Notes (Signed)
Report given to [facility] and nurse on 3700

## 2017-11-28 DIAGNOSIS — E669 Obesity, unspecified: Secondary | ICD-10-CM

## 2017-11-28 DIAGNOSIS — E119 Type 2 diabetes mellitus without complications: Secondary | ICD-10-CM

## 2017-11-28 DIAGNOSIS — I10 Essential (primary) hypertension: Secondary | ICD-10-CM | POA: Diagnosis not present

## 2017-11-28 LAB — HEMOGLOBIN A1C
HEMOGLOBIN A1C: 6.9 % — AB (ref 4.8–5.6)
Mean Plasma Glucose: 151.33 mg/dL

## 2017-11-28 LAB — LIPID PANEL
Cholesterol: 203 mg/dL — ABNORMAL HIGH (ref 0–200)
HDL: 38 mg/dL — AB (ref >40)
LDL CALC: 144 mg/dL — AB (ref 0–99)
Total CHOL/HDL Ratio: 5.3 RATIO
Triglycerides: 104 mg/dL (ref <150)
VLDL: 21 mg/dL (ref 0–40)

## 2017-11-28 LAB — HIV ANTIBODY (ROUTINE TESTING W REFLEX): HIV Screen 4th Generation wRfx: NONREACTIVE

## 2017-11-28 MED ORDER — ASPIRIN EC 81 MG PO TBEC: 81.0000 mg | DELAYED_RELEASE_TABLET | Freq: Every day | ORAL | 0 refills | Status: AC

## 2017-11-28 MED ORDER — BENAZEPRIL HCL 20 MG PO TABS
10.0000 mg | ORAL_TABLET | Freq: Every day | ORAL | Status: DC
  Administered 2017-11-28: 10 mg via ORAL
  Filled 2017-11-28: qty 1

## 2017-11-28 MED ORDER — ATORVASTATIN CALCIUM 20 MG PO TABS: 10.0000 mg | ORAL_TABLET | Freq: Every day | ORAL | 0 refills | Status: DC

## 2017-11-28 MED ORDER — AMLODIPINE BESYLATE 5 MG PO TABS
5.0000 mg | ORAL_TABLET | Freq: Every day | ORAL | Status: DC
  Administered 2017-11-28: 5 mg via ORAL
  Filled 2017-11-28: qty 1

## 2017-11-28 MED ORDER — AMLODIPINE BESY-BENAZEPRIL HCL 5-10 MG PO CAPS: 1.0000 | ORAL_CAPSULE | Freq: Every day | ORAL | Status: DC

## 2017-11-28 NOTE — Progress Notes (Signed)
Occupational Therapy Evaluation Patient Details Name: Tara Holland MRN: [id number] DOB: [birth year] Today's Date: 11/28/2017    History of Present Illness Patient is a 57 y/o female who presents with left sided numbness and facial droop. Admitted with malignant HTN. Ran out of BP meds a few days PTA. Head CT and MRI-MRA-unremarkable. PMH includes HTN.   Clinical Impression   PTA, pt lived alone and was independent with mobility and self care and works in [employer], technical sales doing desk work. Pt complains of ongoing L UE numbness and L thumb pain aroudn the [facility] joint. Pt demonstrates a (+)Spurling's Test, provoking increased hand tingling/numbness. Recommend pt follow up with outpt OT to further assess and address and treat symptoms and improve ergonomics at work.     Follow Up Recommendations  Outpatient OT    Equipment Recommendations  None recommended by OT    Recommendations for Other Services       Precautions / Restrictions Precautions Precautions: Other (comment) Precaution Comments: elevated BP Restrictions Weight Bearing Restrictions: No      Mobility Bed Mobility Overal bed mobility: Independent             General bed mobility comments: Standing in room upon PT arrival.   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                              Began education regarding joint preservation techniques; would benefit from supportive CMC splint     Vision Baseline Vision/History: No visual deficits       Perception     Praxis      Pertinent Vitals/Pain Pain Assessment: Faces Faces Pain Scale: Hurts a little bit Pain Location: L CMC Pain Descriptors / Indicators:  Discomfort Pain Intervention(s): Limited activity within patient's tolerance     Hand Dominance Right   Extremity/Trunk Assessment Upper Extremity Assessment Upper Extremity Assessment: LUE deficits/detail LUE Deficits / Details: Pt complains of numbness in LUE and occasional tingling in her hand. Staes this has been going on "for awhile". Pt complains of point tenderness at the L [facility]. + spurlilng's sing. Discussed using a thumb stabilization splint to reduce L thumb pain. Discussed option of following up with outpt therapy to address posture and strengthening to improve symptom management LUE Sensation: decreased light touch(shoulder to elbow which pt reports as premorbid)   Lower Extremity Assessment Lower Extremity Assessment: Defer to PT evaluation   Cervical / Trunk Assessment Cervical / Trunk Assessment: Normal   Communication Communication Communication: No difficulties   Cognition Arousal/Alertness: Awake/alert Behavior During Therapy: WFL for tasks assessed/performed Overall Cognitive Status: Within Functional Limits for tasks assessed                                     General Comments  BP elevated post activity 184/113. Recently took BP meds.     Exercises     Shoulder Instructions      Home Living  Family/patient expects to be discharged to:: Private residence Living Arrangements: Alone Available Help at Discharge: Family;Available PRN/intermittently Type of Home: Apartment Home Access: Stairs to enter Entrance Stairs-Number of Steps: 2 flights Entrance Stairs-Rails: Right Home Layout: One level     Bathroom Shower/Tub: Teacher, early years/pre: Standard Bathroom Accessibility: Yes How Accessible: Accessible via walker Home Equipment: None          Prior Functioning/Environment Level of Independence: Independent        Comments: Works at [employer] to [employer]; drives. Loves her cat, Shirlee L[place]. Drives        OT Problem List:  Decreased strength;Impaired sensation;Pain      OT Treatment/Interventions:      OT Goals(Current goals can be found in the care plan section) Acute Rehab OT Goals Patient Stated Goal: to go home to my cat and start exercising.  OT Goal Formulation: All assessment and education complete, DC therapy  OT Frequency:     Barriers to D/C:            Co-evaluation              AM-PAC PT "6 Clicks" Daily Activity     Outcome Measure Help from another person eating meals?: None Help from another person taking care of personal grooming?: None Help from another person toileting, which includes using toliet, bedpan, or urinal?: None Help from another person bathing (including washing, rinsing, drying)?: None Help from another person to put on and taking off regular upper body clothing?: None Help from another person to put on and taking off regular lower body clothing?: None 6 Click Score: 24   End of Session Nurse Communication: Mobility status  Activity Tolerance: Patient tolerated treatment well Patient left: in bed;with call bell/phone within reach  OT Visit Diagnosis: Muscle weakness (generalized) (M62.81);Pain Pain - Right/Left: Left Pain - part of body: Arm(neck)                Time: 0950-1008 OT Time Calculation (min): 18 min Charges:  OT General Charges $OT Visit: 1 Visit OT Evaluation $OT Eval Low Complexity: 1 Low G-Codes:     [place], OT/L  [phone] 11/28/2017  Tara Holland,Tara Holland 11/28/2017, 10:50 AM

## 2017-11-28 NOTE — Discharge Instructions (Signed)
Keep a blood pressure diary. Cut back on carbohydrates and cholesterol. Lose at least 20 lbs.   Please take all your medications with you for your next visit with your Primary MD. Please request your Primary MD to go over all hospital test results at the follow up. Please ask your Primary MD to get all Hospital records sent to his/her office.  If you experience worsening of your admission symptoms, develop shortness of breath, chest pain, suicidal or homicidal thoughts or a life threatening emergency, you must seek medical attention immediately by calling 911 or calling your MD.  Tara Holland must read the complete instructions/literature along with all the possible adverse reactions/side effects for all the medicines you take including new medications that have been prescribed to you. Take new medicines after you have completely understood and accpet all the possible adverse reactions/side effects.   Do not drive when taking pain medications or sedatives.    Do not take more than prescribed Pain, Sleep and Anxiety Medications  If you have smoked or chewed Tobacco in the last 2 yrs please stop. Stop any regular alcohol and or recreational drug use.  Wear Seat belts while driving.

## 2017-11-28 NOTE — Discharge Summary (Signed)
Physician Discharge Summary  Tara Holland VFI:[id number] DOB: 14-Jan-1961 DOA: 11/27/2017  PCP: Hoyt Koch, MD  Admit date: 11/27/2017 Discharge date: 11/28/2017  Admitted From: home  Disposition:  home   Recommendations for Outpatient Follow-up:  1. F/u BP in 1 wk and adjust medications as needed  Discharge Condition:  stable   CODE STATUS:  Full code   Diet recommendation:  Heart healthy and diabetic diet Consultations:  none    Discharge Diagnoses:  Principal Problem:   Malignant hypertension Active Problems:   HYPERCHOLESTEROLEMIA   Facial droop   Hyperglycemia   DM (diabetes mellitus), type 2 (HCC)    HPI per Dr Lorin Mercy:  Tara Holland is a 57 y.o. female with medical history significant of HTN with medication non-compliance and HLD not taking medication for this presenting with left facial droop.  Patient has "been having episodes" - had two separate episodes where she couldn't walk straight and was leaning to the left.  Today, she woke up in the early AM and felt like her face was pulling to the left.  Since she was unable to rest, she decided to go to the ER.  She did not have any other symptoms.  Facial droop lasted maybe 10 seconds and she did not have further issue - but she feels like the left side of her face is tighter than the right.  She has noticed that sensation of tightness on the left in the past.  No speech difficulty or swallow dysfunction.  She does get choked a lot when she eats.  No vision changes other than chronically with her BP fluctuation.  Her left arm always feels like it gets numb, but she thinks that might be because of how she sleeps; at one point she started thinking it was her heart.  Mother had a stroke at age 83 and her brother developed ESRD from poorly controlled HTN.    Hospital Course:  Left facial droop- possible TIA vs secondary due to Malignant HTN - the patient's facial droop resolved prior to admission and MRI did not reveal a  CVA - I have asked her to take a baby aspirin daily  - she has some mild numbness remaining in the arm which seems to be related to nerve irritation from neck/shoulder muscle spasms- PT recommends outpt PT for this - she admits to have run out of her BP medication and was trying to wait until her paycheck tomorrow to obtain it- she now understand that she should not allow this to happen again - amlodipine/benazepril has been resumed this AM - she is advised to check her BP every day and keep a BP log - I have asked her to follow up with her PCP early next week to re-assess whether she needs to have her medication increased or a new medication added - further advised to take a low sodium diet and work on daily exercise to lose weight to better control BP  Hyperlipidemia - Cholesterol   203 and LDL 144- have stared Lipitor at a low dose- she states in the past she could not tolerate Crestor due to muscle and joint pains and is aware to stop the Lipitor if these symptoms were to occur again - she is further advised to cut back on cholesterol intake  DM 2 - HbA1c is 6.9 - I have discussed with her the dietary changes she needs to make in order to control he sugars- the RN will do teaching as well  -  she is advised to focus on weight loss and diet to help bring her A1c down- I have not started oral medication yet  Discharge Exam: Vitals:   11/28/17 0738 11/28/17 1021  BP: (!) 174/95 (!) 180/89  Pulse: (!) 55   Resp: 20   Temp: 98.7 F (37.1 C)   SpO2: 98%    Vitals:   11/28/17 0100 11/28/17 0300 11/28/17 0738 11/28/17 1021  BP: (!) 156/74 (!) 182/93 (!) 174/95 (!) 180/89  Pulse: 63 (!) 54 (!) 55   Resp: 20 20 20    Temp: 98 F (36.7 C) 98.4 F (36.9 C) 98.7 F (37.1 C)   TempSrc: Oral Oral Oral   SpO2: 98% 96% 98%   Weight:      Height:        General: Pt is alert, awake, not in acute distress Cardiovascular: RRR, S1/S2 +, no rubs, no gallops Respiratory: CTA bilaterally, no  wheezing, no rhonchi Abdominal: Soft, NT, ND, bowel sounds + Extremities: no edema, no cyanosis   Discharge Instructions  Discharge Instructions    Diet - low sodium heart healthy   Complete by:  As directed    Diet Carb Modified   Complete by:  As directed    Increase activity slowly   Complete by:  As directed      Allergies as of 11/28/2017      Reactions   Sulfonamide Derivatives Itching   Severe itching      Medication List    TAKE these medications   amLODipine-benazepril 5-10 MG capsule Commonly known as:  LOTREL TAKE ONE CAPSULE BY MOUTH ONE TIME DAILY   aspirin EC 81 MG tablet Take 1 tablet (81 mg total) by mouth daily.   atorvastatin 20 MG tablet Commonly known as:  LIPITOR Take 0.5 tablets (10 mg total) by mouth daily at 6 PM.       Allergies  Allergen Reactions  . Sulfonamide Derivatives Itching    Severe itching     Procedures/Studies:    Ct Head Wo Contrast  Result Date: 11/27/2017 CLINICAL DATA:  Focal neurologic deficit greater than 6 hours, stroke suspected. EXAM: CT HEAD WITHOUT CONTRAST TECHNIQUE: Contiguous axial images were obtained from the base of the skull through the vertex without intravenous contrast. COMPARISON:  None. FINDINGS: Brain: No intracranial hemorrhage, mass effect, or midline shift. No hydrocephalus. The basilar cisterns are patent. No evidence of territorial infarct or acute ischemia. No extra-axial or intracranial fluid collection. Vascular: No hyperdense vessel or unexpected calcification. Skull: No fracture or focal lesion. Sinuses/Orbits: Paranasal sinuses and mastoid air cells are clear. The visualized orbits are unremarkable. Other: None. IMPRESSION: Negative noncontrast head CT. Electronically Signed   By: Jeb Levering M.D.   On: 11/27/2017 05:57   Mr Brain Wo Contrast  Result Date: 11/27/2017 CLINICAL DATA:  57 y/o F; patient presenting with left facial droop. TIA, initial exam. EXAM: MRI HEAD WITHOUT CONTRAST  MRA HEAD WITHOUT CONTRAST TECHNIQUE: Multiplanar, multiecho pulse sequences of the brain and surrounding structures were obtained without intravenous contrast. Angiographic images of the head were obtained using MRA technique without contrast. COMPARISON:  11/27/2017 CT head FINDINGS: MRI HEAD FINDINGS Brain: No acute infarction, hemorrhage, hydrocephalus, extra-axial collection or mass lesion. Several scattered smallnonspecific foci of T2 FLAIR hyperintense signal abnormality in subcortical and periventricular white matter are compatible withmildchronic microvascular ischemic changes for age. Mildbrain parenchymal volume loss. Vascular: As below. Skull and upper cervical spine: Normal marrow signal. Sinuses/Orbits: Negative. Other: None. MRA HEAD  FINDINGS Internal carotid arteries:  Patent. Anterior cerebral arteries:  Patent. Middle cerebral arteries: Patent. Anterior communicating artery: Patent. Posterior communicating arteries:  Patent.  Bilateral fetal PCA. Posterior cerebral arteries:  Patent. Basilar artery:  Patent. Vertebral arteries:  Patent. No evidence of high-grade stenosis, large vessel occlusion, or aneurysm. IMPRESSION: 1. No acute intracranial abnormality identified. 2. Mild chronic microvascular ischemic changes and parenchymal volume loss of the brain. 3. Normal MRA of the head. Electronically Signed   By: Kristine Garbe M.D.   On: 11/27/2017 21:39   Mr Jodene Nam Head Wo Contrast  Result Date: 11/27/2017 CLINICAL DATA:  57 y/o F; patient presenting with left facial droop. TIA, initial exam. EXAM: MRI HEAD WITHOUT CONTRAST MRA HEAD WITHOUT CONTRAST TECHNIQUE: Multiplanar, multiecho pulse sequences of the brain and surrounding structures were obtained without intravenous contrast. Angiographic images of the head were obtained using MRA technique without contrast. COMPARISON:  11/27/2017 CT head FINDINGS: MRI HEAD FINDINGS Brain: No acute infarction, hemorrhage, hydrocephalus, extra-axial  collection or mass lesion. Several scattered smallnonspecific foci of T2 FLAIR hyperintense signal abnormality in subcortical and periventricular white matter are compatible withmildchronic microvascular ischemic changes for age. Mildbrain parenchymal volume loss. Vascular: As below. Skull and upper cervical spine: Normal marrow signal. Sinuses/Orbits: Negative. Other: None. MRA HEAD FINDINGS Internal carotid arteries:  Patent. Anterior cerebral arteries:  Patent. Middle cerebral arteries: Patent. Anterior communicating artery: Patent. Posterior communicating arteries:  Patent.  Bilateral fetal PCA. Posterior cerebral arteries:  Patent. Basilar artery:  Patent. Vertebral arteries:  Patent. No evidence of high-grade stenosis, large vessel occlusion, or aneurysm. IMPRESSION: 1. No acute intracranial abnormality identified. 2. Mild chronic microvascular ischemic changes and parenchymal volume loss of the brain. 3. Normal MRA of the head. Electronically Signed   By: Kristine Garbe M.D.   On: 11/27/2017 21:39      The results of significant diagnostics from this hospitalization (including imaging, microbiology, ancillary and laboratory) are listed below for reference.     Microbiology: No results found for this or any previous visit (from the past 240 hour(s)).   Labs: BNP (last 3 results) No results for input(s): BNP in the last 8760 hours. Basic Metabolic Panel: Recent Labs  Lab 11/27/17 0517  NA 141  K 3.4*  CL 107  CO2 27  GLUCOSE 122*  BUN 18  CREATININE 1.09*  CALCIUM 8.8*   Liver Function Tests: Recent Labs  Lab 11/27/17 0517  AST 21  ALT 21  ALKPHOS 80  BILITOT 0.7  PROT 7.0  ALBUMIN 3.4*   No results for input(s): LIPASE, AMYLASE in the last 168 hours. No results for input(s): AMMONIA in the last 168 hours. CBC: Recent Labs  Lab 11/27/17 0517  WBC 7.7  NEUTROABS 4.4  HGB 12.5  HCT 39.8  MCV 91.7  PLT 230   Cardiac Enzymes: Recent Labs  Lab  11/27/17 0517  TROPONINI <0.03   BNP: Invalid input(s): POCBNP CBG: No results for input(s): GLUCAP in the last 168 hours. D-Dimer No results for input(s): DDIMER in the last 72 hours. Hgb A1c Recent Labs    11/28/17 0407  HGBA1C 6.9*   Lipid Profile Recent Labs    11/28/17 0407  CHOL 203*  HDL 38*  LDLCALC 144*  TRIG 104  CHOLHDL 5.3   Thyroid function studies No results for input(s): TSH, T4TOTAL, T3FREE, THYROIDAB in the last 72 hours.  Invalid input(s): FREET3 Anemia work up No results for input(s): VITAMINB12, FOLATE, FERRITIN, TIBC, IRON, RETICCTPCT in the last 12  hours. Urinalysis    Component Value Date/Time   COLORURINE STRAW (A) 11/27/2017 0525   APPEARANCEUR HAZY (A) 11/27/2017 0525   LABSPEC 1.015 11/27/2017 0525   PHURINE 7.0 11/27/2017 0525   GLUCOSEU NEGATIVE 11/27/2017 0525   HGBUR NEGATIVE 11/27/2017 0525   HGBUR negative 11/14/2010 1143   BILIRUBINUR NEGATIVE 11/27/2017 0525   KETONESUR NEGATIVE 11/27/2017 0525   PROTEINUR NEGATIVE 11/27/2017 0525   UROBILINOGEN 0.2 11/14/2010 1143   NITRITE NEGATIVE 11/27/2017 0525   LEUKOCYTESUR NEGATIVE 11/27/2017 0525   Sepsis Labs Invalid input(s): PROCALCITONIN,  WBC,  LACTICIDVEN Microbiology No results found for this or any previous visit (from the past 240 hour(s)).   Time coordinating discharge: Over 30 minutes  SIGNED:   Debbe Odea, MD  [facility] 11/28/2017, 11:27 AM Pager   If 7PM-7AM, please contact night-coverage [email or web address] Password [email or web address]

## 2017-11-28 NOTE — Progress Notes (Signed)
PT Cancellation Note  Patient Details Name: Tara Holland MRN: [id number] DOB: 09/17/1961   Cancelled Treatment:    Reason Eval/Treat Not Completed: Patient not medically ready Pt on bedrest. Will await increase in activity orders prior to PT evaluation. Will follow.   [place] 11/28/2017, 8:09 AM Wray Kearns, PT, DPT [phone]

## 2017-11-28 NOTE — Plan of Care (Signed)
Nutrition Education Note  RD consulted for nutrition education regarding hypertensive urgency.  She reports she is going home to throw out all her food with salt in it. Patient states she has been using sea salt because she thought it was better than regular iodized salt. Educated patient on salt usage and recommended daily intake in setting of hypertension.  RD provided "Low Sodium Nutrition Therapy" handout from the Academy of [employer]. Reviewed patient's dietary recall. Provided examples on ways to decrease sodium intake in diet. Discouraged intake of processed foods and use of salt shaker. Encouraged fresh fruits and vegetables as well as whole grain sources of carbohydrates to maximize fiber intake.   RD discussed why it is important for patient to adhere to diet recommendations, and emphasized the role of fluids, foods to avoid, and importance of weighing self daily. Teach back method used.  Expect fair compliance.  Body mass index is 29.27 kg/m. Pt meets criteria for overweight based on current BMI.  Current diet order is heart healthy, patient is consuming approximately 100% of meals at this time. Labs and medications reviewed. No further nutrition interventions warranted at this time. RD contact information provided. If additional nutrition issues arise, please re-consult RD.   Tara Holland. Tara Cazarez, MS, RD LDN Inpatient Clinical Dietitian Pager [phone]

## 2017-11-28 NOTE — Care Management Note (Signed)
Case Management Note  Patient Details  Name: Tara Holland MRN: [id number] Date of Birth: 10/15/1960  Subjective/Objective:       Pt in with malignant HTN. She is from home alone.              Action/Plan: Pt has PCP, insurance and transportation home. No further needs per CM.   Expected Discharge Date:  11/28/17               Expected Discharge Plan:  Home/Self Care  In-House Referral:     Discharge planning Services     Post Acute Care Choice:    Choice offered to:     DME Arranged:    DME Agency:     HH Arranged:    HH Agency:     Status of Service:  Completed, signed off  If discussed at [employer] of Stay Meetings, dates discussed:    Additional Comments:  Pollie Friar, RN 11/28/2017, 10:49 AM

## 2017-11-28 NOTE — Evaluation (Signed)
Physical Therapy Evaluation Patient Details Name: Tara Holland MRN: [id number] DOB: May 09, 1961 Today's Date: 11/28/2017   History of Present Illness  Patient is a 57 y/o female who presents with left sided numbness and facial droop. Admitted with malignant HTN. Ran out of BP meds a few days PTA. Head CT and MRI-MRA-unremarkable. PMH includes HTN.  Clinical Impression  Patient presents with elevated BP and mild facial droop (which pt does not notice). Tolerated gait training and stair training without difficulty. Tolerated higher level balance challenges without deviations in gait. 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 Eager to start eating better, get off BP meds and start exercising. Education re: signs/symptoms of stroke. Pt does not require skilled therapy services as pt functioning close to baseline. Discharge from therapy.    Follow Up Recommendations No PT follow up    Equipment Recommendations  None recommended by PT    Recommendations for Other Services       Precautions / Restrictions Precautions Precautions: Other (comment) Precaution Comments: elevated BP Restrictions Weight Bearing Restrictions: No      Mobility  Bed Mobility               General bed mobility comments: Standing in room upon PT arrival.   Transfers Overall transfer level: Independent               General transfer comment: Stood from EOB x2 without difficulty.   Ambulation/Gait Ambulation/Gait assistance: Independent Ambulation Distance (Feet): 350 Feet Assistive device: None Gait Pattern/deviations: WFL(Within Functional Limits)   Gait velocity interpretation: at or above normal speed for age/gender General Gait Details: Steady gait, tolerated higher level balance challenges without difficulty. HR ranged from 66-106 bpm. 2/4 DOE. Reports as baseline.  Stairs Stairs: Yes Stairs assistance: Modified independent (Device/Increase time) Stair Management:  One rail Right;Alternating pattern Number of Stairs: 26 General stair comments: 2 flights of steps; uses rail PRn. Mild SOB.   Wheelchair Mobility    Modified Rankin (Stroke Patients Only) Modified Rankin (Stroke Patients Only) Pre-Morbid Rankin Score: No significant disability Modified Rankin: No significant disability     Balance Overall balance assessment: Needs assistance Sitting-balance support: Feet supported;No upper extremity supported Sitting balance-Leahy Scale: Normal     Standing balance support: During functional activity Standing balance-Leahy Scale: Normal               High level balance activites: Backward walking;Direction changes;Turns;Sudden stops;Head turns High Level Balance Comments: Tolerated above with no deviations in gait or LOB.              Pertinent Vitals/Pain Pain Assessment: No/denies pain    Home Living Family/patient expects to be discharged to:: Private residence Living Arrangements: Alone Available Help at Discharge: Family;Available PRN/intermittently Type of Home: Apartment Home Access: Stairs to enter Entrance Stairs-Rails: Right Entrance Stairs-Number of Steps: 2 flights Home Layout: One level Home Equipment: None      Prior Function Level of Independence: Independent         Comments: Works at [employer] to [employer]; drives. Loves her cat, Shirlee Limerick.     Hand Dominance   Dominant Hand: Right    Extremity/Trunk Assessment   Upper Extremity Assessment Upper Extremity Assessment: Defer to OT evaluation;LUE deficits/detail LUE Sensation: decreased light touch(shoulder to elbow which pt reports as premorbid)    Lower Extremity Assessment Lower Extremity Assessment: Overall WFL for tasks assessed    Cervical / Trunk Assessment Cervical / Trunk Assessment: Normal  Communication  Communication: No difficulties  Cognition Arousal/Alertness: Awake/alert Behavior During Therapy: WFL for tasks assessed/performed Overall  Cognitive Status: Within Functional Limits for tasks assessed                                        General Comments General comments (skin integrity, edema, etc.): BP elevated post activity 184/113. Recently took BP meds.     Exercises     Assessment/Plan    PT Assessment Patent does not need any further PT services  PT Problem List         PT Treatment Interventions      PT Goals (Current goals can be found in the Care Plan section)  Acute Rehab PT Goals Patient Stated Goal: to go home to my cat and start exercising.  PT Goal Formulation: All assessment and education complete, DC therapy    Frequency     Barriers to discharge        Co-evaluation               AM-PAC PT "6 Clicks" Daily Activity  Outcome Measure Difficulty turning over in bed (including adjusting bedclothes, sheets and blankets)?: None Difficulty moving from lying on back to sitting on the side of the bed? : None Difficulty sitting down on and standing up from a chair with arms (e.g., wheelchair, bedside commode, etc,.)?: None Help needed moving to and from a bed to chair (including a wheelchair)?: None Help needed walking in hospital room?: None Help needed climbing 3-5 steps with a railing? : None 6 Click Score: 24    End of Session   Activity Tolerance: Patient tolerated treatment well;Treatment limited secondary to medical complications (Comment)(elevated BP) Patient left: in bed;with call bell/phone within reach Nurse Communication: Mobility status PT Visit Diagnosis: Unsteadiness on feet (R26.81)    Time: 3151-7616 PT Time Calculation (min) (ACUTE ONLY): 20 min   Charges:   PT Evaluation $PT Eval Low Complexity: 1 Low     PT G CodesWray Kearns, PT, DPT [phone]    Tara Holland 11/28/2017, 9:21 AM

## 2017-11-28 NOTE — Progress Notes (Signed)
Discharge instructions reviewed with patient. RN provided Product/process development scientist. All questions answered at this time. Pt awaiting for transport.   Hav, RN

## 2017-11-29 ENCOUNTER — Telehealth: Payer: Self-pay

## 2017-11-29 NOTE — Telephone Encounter (Signed)
Pt on TCM list for admission on 11/27/2017 and dc'ed on 11/28/2017. Pt was admitted for left facial droop - possible TIA vs secondary due to malignant HTN. MRI did not reveal CVA.   Pt to follow up with PCP next week to re-assess whether she needs to have her medication increased or a new medication added.   Pt contacted and stated that she is changing doctors.

## 2018-03-10 DIAGNOSIS — J358 Other chronic diseases of tonsils and adenoids: Secondary | ICD-10-CM | POA: Insufficient documentation

## 2018-03-10 DIAGNOSIS — E663 Overweight: Secondary | ICD-10-CM | POA: Insufficient documentation

## 2018-08-13 ENCOUNTER — Encounter: Payer: Self-pay | Admitting: Plastic Surgery

## 2018-09-08 ENCOUNTER — Other Ambulatory Visit: Payer: Self-pay | Admitting: Internal Medicine

## 2018-10-15 ENCOUNTER — Emergency Department (HOSPITAL_BASED_OUTPATIENT_CLINIC_OR_DEPARTMENT_OTHER)
Admission: EM | Admit: 2018-10-15 | Discharge: 2018-10-15 | Discharge status: Against Medical Advice | Payer: 59 | Attending: Emergency Medicine | Admitting: Emergency Medicine

## 2018-10-15 ENCOUNTER — Encounter (HOSPITAL_BASED_OUTPATIENT_CLINIC_OR_DEPARTMENT_OTHER): Payer: Self-pay

## 2018-10-15 ENCOUNTER — Other Ambulatory Visit: Payer: Self-pay

## 2018-10-15 DIAGNOSIS — Z5321 Procedure and treatment not carried out due to patient leaving prior to being seen by health care provider: Secondary | ICD-10-CM | POA: Insufficient documentation

## 2018-10-15 DIAGNOSIS — M79601 Pain in right arm: Secondary | ICD-10-CM | POA: Diagnosis not present

## 2018-10-15 NOTE — ED Triage Notes (Addendum)
Pt c/o pain to right arm x 3 days-states she is unable to lift arm above mid level-pain worse with movement/feels pain is in muscle-denies injury-NAD-steady gait

## 2018-10-30 ENCOUNTER — Emergency Department (HOSPITAL_BASED_OUTPATIENT_CLINIC_OR_DEPARTMENT_OTHER)
Admission: EM | Admit: 2018-10-30 | Discharge: 2018-10-30 | Disposition: A | Discharge status: Home / Self Care | Payer: 59 | Attending: Emergency Medicine | Admitting: Emergency Medicine

## 2018-10-30 ENCOUNTER — Emergency Department (HOSPITAL_BASED_OUTPATIENT_CLINIC_OR_DEPARTMENT_OTHER): Payer: 59

## 2018-10-30 ENCOUNTER — Other Ambulatory Visit: Payer: Self-pay

## 2018-10-30 ENCOUNTER — Encounter (HOSPITAL_BASED_OUTPATIENT_CLINIC_OR_DEPARTMENT_OTHER): Payer: Self-pay | Admitting: *Deleted

## 2018-10-30 DIAGNOSIS — M25511 Pain in right shoulder: Secondary | ICD-10-CM | POA: Diagnosis not present

## 2018-10-30 DIAGNOSIS — E119 Type 2 diabetes mellitus without complications: Secondary | ICD-10-CM | POA: Diagnosis not present

## 2018-10-30 DIAGNOSIS — Z79899 Other long term (current) drug therapy: Secondary | ICD-10-CM | POA: Diagnosis not present

## 2018-10-30 DIAGNOSIS — I10 Essential (primary) hypertension: Secondary | ICD-10-CM | POA: Insufficient documentation

## 2018-10-30 MED ORDER — METHOCARBAMOL 500 MG PO TABS: 500.0000 mg | ORAL_TABLET | Freq: Two times a day (BID) | ORAL | 0 refills | Status: DC

## 2018-10-30 NOTE — Discharge Instructions (Signed)
You can take Tylenol or Ibuprofen as directed for pain. You can alternate Tylenol and Ibuprofen every 4 hours. If you take Tylenol at 1pm, then you can take Ibuprofen at 5pm. Then you can take Tylenol again at 9pm.   Take Robaxin as prescribed. This medication will make you drowsy so do not drive or drink alcohol when taking it.  As we discussed, if you do not have any improvement in symptoms, please follow-up with referred orthopedic doctor.  Return to emergency department for any worsening pain, warmth or redness of the shoulder, fevers, numbness/weakness or any other worsening or concerning symptoms.

## 2018-10-30 NOTE — ED Provider Notes (Signed)
[facility] EMERGENCY DEPARTMENT Provider Note   CSN: [id number] Arrival date & time: 10/30/18  1926     History   Chief Complaint Chief Complaint  Patient presents with  . Shoulder Pain    HPI Tara Holland is a 58 y.o. female past medical history of hypertension, hypercholesterolemia who presents for evaluation of right shoulder pain x1 week.  Patient states that she did not have any preceding trauma, injury, fall.  She reports that pain is worse with movement, particularly when she tries to lift over her head or move her arm to the side.  She states that she does do heavy lifting at home and remembers that she lifted a 40 pound water cooler prior to onset of symptoms.  She states she has not noticed any overlying warmth, erythema.  Patient has not had any fevers, numbness/weakness.  Patient states that she does not do continuous overhead lifting for work.  The history is provided by the patient.    Past Medical History:  Diagnosis Date  . Hypertension   . Pain in joint, hand   . Pure hypercholesterolemia   . Vaginal irritation     Patient Active Problem List   Diagnosis Date Noted  . DM (diabetes mellitus), type 2 ([place]) 11/28/2017  . Facial droop 11/27/2017  . Malignant hypertension 11/27/2017  . Hyperglycemia 11/27/2017  . Abdominal pain 05/24/2016  . Spasm of vocal cords 11/11/2015  . VENTRICULAR HYPERTROPHY, LEFT 02/17/2009  . OBESITY 02/02/2009  . Essential hypertension 01/04/2009  . HYPERCHOLESTEROLEMIA 02/06/2008    Past Surgical History:  Procedure Laterality Date  . BREAST LUMPECTOMY    . VAGINAL HYSTERECTOMY       OB History   No obstetric history on file.      Home Medications    Prior to Admission medications   Medication Sig Start Date End Date Taking? Authorizing Provider  amLODipine-benazepril (LOTREL) 5-10 MG capsule TAKE ONE CAPSULE BY MOUTH ONE TIME DAILY  08/26/17  Yes Hoyt Koch, MD  atorvastatin (LIPITOR) 20 MG  tablet Take 0.5 tablets (10 mg total) by mouth daily at 6 PM. 11/28/17   Debbe Odea, MD  methocarbamol (ROBAXIN) 500 MG tablet Take 1 tablet (500 mg total) by mouth 2 (two) times daily. 10/30/18   Volanda Napoleon, PA-C    Family History Family History  Problem Relation Age of Onset  . Hypertension Mother   . Kidney disease Mother   . CVA Mother 22  . Kidney disease Brother     Social History Social History   Tobacco Use  . Smoking status: Never Smoker  . Smokeless tobacco: Never Used  Substance Use Topics  . Alcohol use: No  . Drug use: No     Allergies   Sulfonamide derivatives   Review of Systems Review of Systems  Constitutional: Negative for fever.  Gastrointestinal: Negative for vomiting.  Genitourinary: Negative for hematuria.  Musculoskeletal: Negative for neck pain.       Right shoulder pain  Skin: Negative for color change.  Neurological: Negative for weakness and numbness.  All other systems reviewed and are negative.    Physical Exam Updated Vital Signs BP (!) 168/110   Pulse 70   Temp 99.1 F (37.3 C) (Oral)   Resp 20   Ht 5\' 11"  (1.803 m)   Wt 95.2 kg   SpO2 100%   BMI 29.27 kg/m   Physical Exam Vitals signs and nursing note reviewed.  Constitutional:  Appearance: She is well-developed.  HENT:     Head: Normocephalic and atraumatic.  Eyes:     General: No scleral icterus.       Right eye: No discharge.        Left eye: No discharge.     Conjunctiva/sclera: Conjunctivae normal.  Neck:     Comments: Negative Spurling's. Cardiovascular:     Pulses:          Radial pulses are 2+ on the right side and 2+ on the left side.  Pulmonary:     Effort: Pulmonary effort is normal.  Musculoskeletal:     Comments: Limited range of motion right shoulder secondary to pain.  Abduction intact without any difficulty.  Limited abduction.  She can flex to about 100 degrees before is experiencing significant pain.  Extension intact.  Patient can  tenderness palpation noted to the anterior aspect of the right shoulder and diffusely over the deltoid.  No deformity or crepitus noted.  No overlying warmth, erythema.  Unable to assess Neer's impingement secondary to pain.  Positive Hawkins.  Negative liftoff test.  Positive empty can test.  No tenderness palpation of the left upper extremity.  Full range of motion without any difficulty.  Negative Hawkins, liftoff, empty can, impingement.   Skin:    General: Skin is warm and dry.     Capillary Refill: Capillary refill takes less than 2 seconds.     Comments: Good distal cap refill. RUE is not dusky in appearance or cool to touch.  Neurological:     Mental Status: She is alert.     Comments: Equal grip strength bilaterally.  Psychiatric:        Speech: Speech normal.        Behavior: Behavior normal.      ED Treatments / Results  Labs (all labs ordered are listed, but only abnormal results are displayed) Labs Reviewed - No data to display  EKG None  Radiology Dg Shoulder Right  Result Date: 10/30/2018 CLINICAL DATA:  58 year old female with right shoulder pain. No known injury. EXAM: RIGHT SHOULDER - 2+ VIEW COMPARISON:  None. FINDINGS: There is no acute fracture or dislocation. No arthritic changes. There is mild hypertrophy of the right AC joint. The soft tissues are unremarkable. IMPRESSION: Negative. 57 Electronically Signed   By: Anner Crete M.D.   On: 10/30/2018 20:44    Procedures Procedures (including critical care time)  Medications Ordered in ED Medications - No data to display   Initial Impression / Assessment and Plan / ED Course  I have reviewed the triage vital signs and the nursing notes.  Pertinent labs & imaging results that were available during my care of the patient were reviewed by me and considered in my medical decision making (see chart for details).     58 year old female who presents for evaluation of right shoulder pain x1 week.  Patient  denies any preceding trauma, injury, fall.  She states that she does remember lifting a heavy cooler prior to onset of pain.  Patient denies any fevers, numbness/weakness. Patient is afebrile, non-toxic appearing, sitting comfortably on examination table. Vital signs reviewed and stable. Patient is neurovascularly intact.  On exam, tenderness palpation noted to the anterior right shoulder.  Positive Hawkins, empty can test.  Unable to assess Neer's pain treatment.  Consider rotator cuff impingement versus tear versus strain versus musculoskeletal etiology.  Low suspicion for fracture dislocation.  X-ray ordered at triage.  History/physical exam not concerning for  DVT of upper extremity, acute arterial embolism, septic arthritis.  X-ray reviewed.  Negative for any acute bony abnormality.  Discussed with patient.  We will plan to treat as musculoskeletal in etiology versus rotator cuff impingement.  Doubt tear given ability to raise hand and lack of trauma, injury.  Encourage at home supportive care measures.  Plan to send home with short course of muscle relaxers help with pain.  Patient provided outpatient orthopedic to follow-up with her symptoms do not improve in the next 2 to 3 weeks. At this time, patient exhibits no emergent life-threatening condition that require further evaluation in ED or admission. Patient had ample opportunity for questions and discussion. All patient's questions were answered with full understanding. Strict return precautions discussed. Patient expresses understanding and agreement to plan.   Portions of this note were generated with L[employer]. Dictation errors may occur despite best attempts at proofreading.   Final Clinical Impressions(s) / ED Diagnoses   Final diagnoses:  Acute pain of right shoulder    ED Discharge Orders         Ordered    methocarbamol (ROBAXIN) 500 MG tablet  2 times daily     10/30/18 2115           Desma Mcgregor 10/30/18 2139    Charlesetta Shanks, MD 11/03/18 [phone]

## 2018-10-30 NOTE — ED Triage Notes (Signed)
Right shoulder pain for a week. Limited ROM.

## 2018-10-30 NOTE — ED Notes (Signed)
Pt met at d/c window with paperwork.

## 2018-10-30 NOTE — ED Notes (Signed)
Pt called out wanting to know results and to be able to leave. Pt informed x-ray just resulted and EDP would be by shortly. Pt agreed to stay

## 2018-12-01 DIAGNOSIS — Z9882 Breast implant status: Secondary | ICD-10-CM | POA: Insufficient documentation

## 2019-02-11 ENCOUNTER — Telehealth: Payer: Self-pay | Admitting: Plastic Surgery

## 2019-02-11 NOTE — Telephone Encounter (Signed)
Called patient to confirm appointment scheduled for tomorrow. Patient answered the following questions: °1.Has the patient traveled outside of the state of [place] at all within the past 6 weeks? No °2.Does the patient have a fever or cough at all? No °3.Has the patient been tested for COVID? Had a positive COVID test? No °4. Has the patient been in contact with anyone who has tested positive? No ° °

## 2019-02-12 ENCOUNTER — Encounter: Payer: Self-pay | Admitting: Plastic Surgery

## 2019-02-12 ENCOUNTER — Institutional Professional Consult (permissible substitution): Payer: 59 | Admitting: Plastic Surgery

## 2019-02-12 ENCOUNTER — Other Ambulatory Visit: Payer: Self-pay

## 2019-02-12 ENCOUNTER — Ambulatory Visit: Payer: 59 | Admitting: Plastic Surgery

## 2019-02-12 VITALS — BP 172/105 | HR 61 | Temp 98.5°F | Ht 70.0 in | Wt 205.0 lb

## 2019-02-12 DIAGNOSIS — G8929 Other chronic pain: Secondary | ICD-10-CM

## 2019-02-12 DIAGNOSIS — M546 Pain in thoracic spine: Secondary | ICD-10-CM | POA: Diagnosis not present

## 2019-02-12 DIAGNOSIS — M542 Cervicalgia: Secondary | ICD-10-CM | POA: Diagnosis not present

## 2019-02-12 DIAGNOSIS — N62 Hypertrophy of breast: Secondary | ICD-10-CM

## 2019-02-12 NOTE — Progress Notes (Signed)
Patient ID: Tara Holland, female    DOB: 10-04-1961, 58 y.o.   MRN: [id number]   Chief Complaint  Patient presents with  . Breast Problem    Mammary Hyperplasia: The patient is a 58 y.o. female with a history of mammary hyperplasia for several years.  She has extremely large breasts causing symptoms that include the following: Back pain (upper and lower) and neck pain. She frequently pins bra cups higher on straps for better lift and relief. Notices relief when holding breast up in her hands. Shoulder straps causing grooves, pain occasionally requiring padding. Pain medication is sometimes required with motrin and tylenol.  Activities that are hindered by enlarged breasts include: running and exercise.  She has been to her PCP with complaints of neck and back pain for years.    Her breasts are extremely large and fairly symmetric.  She has hyperpigmentation of the inframammary area on both sides.  The sternal to nipple distance on the right is 33 cm and the left is 35 cm.  The IMF distance is 20 cm.  She is 5 feet 10 inches tall and weighs 205 pounds.  Preoperative bra size = 44 G cup. She would like to be a D cup. The estimated excess breast tissue to be removed at the time of surgery = 900 grams on the left and 900 grams on the right.  Mammogram history: Nov 2019 and was negative.  Had a lumpectomy several years ago which was negative.  No radiation  The patient has had anesthesia or sedation in the past.   The patient has not had problems with anesthesia  She has a family history of breast cancer, sister.   Her other medical history is remarkable for hypercholesterolemia, Type 2 DM and hypertension. Her surgical history is remarkable for hysterectomy.   Review of Systems  Constitutional: Positive for activity change. Negative for appetite change.  HENT: Negative.   Eyes: Negative.   Respiratory: Negative for chest tightness and shortness of breath.   Cardiovascular: Negative for leg  swelling.  Gastrointestinal: Negative for abdominal distention and abdominal pain.  Endocrine: Negative.  Negative for heat intolerance.  Genitourinary: Negative.   Musculoskeletal: Positive for back pain and neck pain.  Skin: Negative for wound.  Hematological: Negative.   Psychiatric/Behavioral: Negative.     Past Medical History:  Diagnosis Date  . Hypertension   . Pain in joint, hand   . Pure hypercholesterolemia   . Vaginal irritation     Past Surgical History:  Procedure Laterality Date  . BREAST LUMPECTOMY    . VAGINAL HYSTERECTOMY        Current Outpatient Medications:  .  amLODipine-benazepril (LOTREL) 5-10 MG capsule, TAKE ONE CAPSULE BY MOUTH ONE TIME DAILY , Disp: 90 capsule, Rfl: 3 .  atorvastatin (LIPITOR) 20 MG tablet, Take 0.5 tablets (10 mg total) by mouth daily at 6 PM., Disp: 30 tablet, Rfl: 0 .  methocarbamol (ROBAXIN) 500 MG tablet, Take 1 tablet (500 mg total) by mouth 2 (two) times daily., Disp: 16 tablet, Rfl: 0   Objective:   Vitals:   02/12/19 0940  BP: (!) 172/105  Pulse: 61  Temp: 98.5 F (36.9 C)  SpO2: 98%    Physical Exam Vitals signs and nursing note reviewed.  Constitutional:      Appearance: Normal appearance.  HENT:     Head: Normocephalic and atraumatic.  Cardiovascular:     Rate and Rhythm: Normal rate.     Pulses:  Normal pulses.  Pulmonary:     Effort: Pulmonary effort is normal.  Abdominal:     General: Abdomen is flat. There is no distension.     Tenderness: There is no abdominal tenderness.  Skin:    General: Skin is warm.  Neurological:     General: No focal deficit present.     Mental Status: She is alert and oriented to person, place, and time.  Psychiatric:        Mood and Affect: Mood normal.        Behavior: Behavior normal.     Assessment & Plan:  Chronic bilateral thoracic back pain  Symptomatic mammary hypertrophy  Neck pain  Recommend bilateral breast reduction.  Will need mammogram report from  2019.  Will also need HgA1C prior to surgery.    Pictures taken and placed in chart with patient permission.  [place], DO

## 2019-02-13 ENCOUNTER — Telehealth: Payer: Self-pay | Admitting: *Deleted

## 2019-02-13 ENCOUNTER — Telehealth: Payer: Self-pay | Admitting: Plastic Surgery

## 2019-02-13 NOTE — Telephone Encounter (Signed)
error 

## 2019-02-13 NOTE — Telephone Encounter (Signed)
Received call from patient regarding her consultation for breast reduction yesterday. She has been made aware that her insurance will not change due to furlough and she is able to proceed with the breast reduction surgery. She would like to do so at this time.

## 2019-04-01 ENCOUNTER — Other Ambulatory Visit: Payer: Self-pay | Admitting: Pediatric Intensive Care

## 2019-04-01 DIAGNOSIS — Z20822 Contact with and (suspected) exposure to covid-19: Secondary | ICD-10-CM

## 2019-04-05 LAB — NOVEL CORONAVIRUS, NAA: SARS-CoV-2, NAA: NOT DETECTED

## 2019-04-06 ENCOUNTER — Telehealth: Payer: Self-pay | Admitting: Plastic Surgery

## 2019-04-06 DIAGNOSIS — N183 Chronic kidney disease, stage 3 unspecified: Secondary | ICD-10-CM | POA: Insufficient documentation

## 2019-04-06 NOTE — Telephone Encounter (Signed)

## 2019-04-07 ENCOUNTER — Encounter: Payer: Self-pay | Admitting: Surgical

## 2019-04-07 ENCOUNTER — Other Ambulatory Visit: Payer: Self-pay

## 2019-04-07 ENCOUNTER — Ambulatory Visit (INDEPENDENT_AMBULATORY_CARE_PROVIDER_SITE_OTHER): Payer: 59 | Admitting: Surgical

## 2019-04-07 VITALS — BP 165/84 | HR 64 | Temp 98.4°F | Ht 70.0 in | Wt 201.2 lb

## 2019-04-07 DIAGNOSIS — N62 Hypertrophy of breast: Secondary | ICD-10-CM

## 2019-04-07 MED ORDER — CEPHALEXIN 500 MG PO CAPS: 500.0000 mg | ORAL_CAPSULE | Freq: Four times a day (QID) | ORAL | 0 refills | Status: AC

## 2019-04-07 MED ORDER — ONDANSETRON HCL 4 MG PO TABS: 4.0000 mg | ORAL_TABLET | Freq: Three times a day (TID) | ORAL | 0 refills | Status: AC | PRN

## 2019-04-07 MED ORDER — HYDROCODONE-ACETAMINOPHEN 5-325 MG PO TABS: 1.0000 | ORAL_TABLET | Freq: Four times a day (QID) | ORAL | 0 refills | Status: DC | PRN

## 2019-04-07 NOTE — H&P (View-Only) (Signed)
Patient ID: Tara Holland, female    DOB: 1961/05/05, 58 y.o.   MRN: [id number]    ICD-10-CM   1. Symptomatic mammary hypertrophy  N62      History of Present Illness: Tara Holland is a 58 y.o.  female  with a history of symptomatic mammary hypertrophy.  She presents for preoperative evaluation for upcoming breast reduction, scheduled for 04/23/2019 with Dr. Marla Roe.   Patient is feeling well today denies any recent illness. Denies fevers, chills, N/V, fatigue, weakness.  Past Medical History: Allergies: Allergies  Allergen Reactions  . Sulfonamide Derivatives Itching    Severe itching    Current Medications:  Current Outpatient Medications:  .  amLODipine-benazepril (LOTREL) 5-20 MG capsule, Take by mouth., Disp: , Rfl:  .  Multiple Vitamin (MULTI-VITAMIN DAILY PO), Multi Vitamin  daily, Disp: , Rfl:  .  atorvastatin (LIPITOR) 20 MG tablet, Take 0.5 tablets (10 mg total) by mouth daily at 6 PM. (Patient not taking: Reported on 04/07/2019), Disp: 30 tablet, Rfl: 0  Past Medical Problems: Past Medical History:  Diagnosis Date  . Hypertension   . Pain in joint, hand   . Pure hypercholesterolemia   . Vaginal irritation     Past Surgical History: Past Surgical History:  Procedure Laterality Date  . BREAST LUMPECTOMY    . VAGINAL HYSTERECTOMY      The patient has not had anesthesia or sedation in the past.   The patient has not had problems with anesthesia.  The patient does not have a family history of anesthesia problems.    Social History: Social History   Socioeconomic History  . Marital status: Single    Spouse name: Not on file  . Number of children: Not on file  . Years of education: Not on file  . Highest education level: Not on file  Occupational History  . Occupation: Sales promotion account executive  Social Needs  . Financial resource strain: Not on file  . Food insecurity    Worry: Not on file    Inability: Not on file  . Transportation needs    Medical: Not  on file    Non-medical: Not on file  Tobacco Use  . Smoking status: Never Smoker  . Smokeless tobacco: Never Used  Substance and Sexual Activity  . Alcohol use: No  . Drug use: No  . Sexual activity: Not on file  Lifestyle  . Physical activity    Days per week: Not on file    Minutes per session: Not on file  . Stress: Not on file  Relationships  . Social Herbalist on phone: Not on file    Gets together: Not on file    Attends religious service: Not on file    Active member of club or organization: Not on file    Attends meetings of clubs or organizations: Not on file    Relationship status: Not on file  . Intimate partner violence    Fear of current or ex partner: Not on file    Emotionally abused: Not on file    Physically abused: Not on file    Forced sexual activity: Not on file  Other Topics Concern  . Not on file  Social History Narrative  . Not on file    Family History: Family History  Problem Relation Age of Onset  . Hypertension Mother   . Kidney disease Mother   . CVA Mother 20  . Kidney disease Brother  Review of Systems: Review of Systems  Constitutional: Negative for chills, fever and weight loss.  HENT: Negative.   Eyes: Negative.   Respiratory: Negative.  Negative for cough, shortness of breath and wheezing.   Cardiovascular: Negative.  Negative for chest pain and palpitations.  Gastrointestinal: Negative.   Genitourinary: Negative.   Musculoskeletal: Negative.   Skin: Negative.  Negative for rash.  Neurological: Negative.     Physical Exam: Vital Signs BP (!) 165/84 (BP Location: Right Arm, Patient Position: Sitting, Cuff Size: Large)   Pulse 64   Temp 98.4 F (36.9 C) (Oral)   Ht 5\' 10"  (1.778 m)   Wt 201 lb 3.2 oz (91.3 kg)   SpO2 99%   BMI 28.87 kg/m  Physical Exam Constitutional:      General: She is not in acute distress.    Appearance: Normal appearance. She is not ill-appearing or diaphoretic.  HENT:      Head: Normocephalic and atraumatic.  Neck:     Musculoskeletal: Normal range of motion and neck supple. No muscular tenderness.  Cardiovascular:     Rate and Rhythm: Normal rate and regular rhythm.     Pulses: Normal pulses.     Heart sounds: Normal heart sounds. No murmur.  Pulmonary:     Effort: Pulmonary effort is normal.     Breath sounds: Normal breath sounds.  Abdominal:     General: Abdomen is flat. There is no distension.     Palpations: Abdomen is soft.     Tenderness: There is no abdominal tenderness.  Musculoskeletal: Normal range of motion.  Lymphadenopathy:     Cervical: No cervical adenopathy.  Skin:    General: Skin is warm and dry.  Neurological:     General: No focal deficit present.     Mental Status: She is alert and oriented to person, place, and time.    Assessment/Plan: Plan for bilateral breast reduction.  Her preop bra size is 44 G and she would like to be a D cup.  Estimated excess breast tissue that should be removed at the time of surgery is about 900 g on the left and 900 on the right.  Patient should have her A1c checked prior to surgery, will send order.  Risks, benefits, and alternatives of procedure discussed, questions answered and consent obtained.    The risk that can be encountered with breast reduction were discussed and include the following but not limited to these:  Breast asymmetry, fluid accumulation, firmness of the breast, inability to breast feed, loss of nipple or areola, skin loss, decrease or no nipple sensation, fat necrosis of the breast tissue, bleeding, infection, healing delay.  There are risks of anesthesia, changes to skin sensation and injury to nerves or blood vessels.  The muscle can be temporarily or permanently injured.  You may have an allergic reaction to tape, suture, glue, blood products which can result in skin discoloration, swelling, pain, skin lesions, poor healing.  Any of these can lead to the need for revisonal  surgery or stage procedures.  A reduction has potential to interfere with diagnostic procedures.  Nipple or breast piercing can increase risks of infection.  This procedure is best done when the breast is fully developed.  Changes in the breast will continue to occur over time.  Pregnancy can alter the outcomes of previous breast reduction surgery, weight gain and weigh loss can also effect the long term appearance.    Electronically signed by: Carola Rhine Jed Kutch, PA-C 04/07/2019  4:29 PM   Dr. Marla Roe Note: I have also seen the patient and spoken with her.  Answered all her questions and sent in her prescriptions to the pharmacy.

## 2019-04-07 NOTE — Addendum Note (Signed)
Addended by: Wallace Going on: 04/07/2019 06:13 PM   Modules accepted: Orders

## 2019-04-07 NOTE — Progress Notes (Addendum)
Patient ID: Tara Holland, female    DOB: 1960/12/29, 58 y.o.   MRN: [id number]    ICD-10-CM   1. Symptomatic mammary hypertrophy  N62      History of Present Illness: Tara Holland is a 58 y.o.  female  with a history of symptomatic mammary hypertrophy.  She presents for preoperative evaluation for upcoming breast reduction, scheduled for 04/23/2019 with Dr. Marla Roe.   Patient is feeling well today denies any recent illness. Denies fevers, chills, N/V, fatigue, weakness.  Past Medical History: Allergies: Allergies  Allergen Reactions  . Sulfonamide Derivatives Itching    Severe itching    Current Medications:  Current Outpatient Medications:  .  amLODipine-benazepril (LOTREL) 5-20 MG capsule, Take by mouth., Disp: , Rfl:  .  Multiple Vitamin (MULTI-VITAMIN DAILY PO), Multi Vitamin  daily, Disp: , Rfl:  .  atorvastatin (LIPITOR) 20 MG tablet, Take 0.5 tablets (10 mg total) by mouth daily at 6 PM. (Patient not taking: Reported on 04/07/2019), Disp: 30 tablet, Rfl: 0  Past Medical Problems: Past Medical History:  Diagnosis Date  . Hypertension   . Pain in joint, hand   . Pure hypercholesterolemia   . Vaginal irritation     Past Surgical History: Past Surgical History:  Procedure Laterality Date  . BREAST LUMPECTOMY    . VAGINAL HYSTERECTOMY      The patient has not had anesthesia or sedation in the past.   The patient has not had problems with anesthesia.  The patient does not have a family history of anesthesia problems.    Social History: Social History   Socioeconomic History  . Marital status: Single    Spouse name: Not on file  . Number of children: Not on file  . Years of education: Not on file  . Highest education level: Not on file  Occupational History  . Occupation: Sales promotion account executive  Social Needs  . Financial resource strain: Not on file  . Food insecurity    Worry: Not on file    Inability: Not on file  . Transportation needs    Medical: Not  on file    Non-medical: Not on file  Tobacco Use  . Smoking status: Never Smoker  . Smokeless tobacco: Never Used  Substance and Sexual Activity  . Alcohol use: No  . Drug use: No  . Sexual activity: Not on file  Lifestyle  . Physical activity    Days per week: Not on file    Minutes per session: Not on file  . Stress: Not on file  Relationships  . Social Herbalist on phone: Not on file    Gets together: Not on file    Attends religious service: Not on file    Active member of club or organization: Not on file    Attends meetings of clubs or organizations: Not on file    Relationship status: Not on file  . Intimate partner violence    Fear of current or ex partner: Not on file    Emotionally abused: Not on file    Physically abused: Not on file    Forced sexual activity: Not on file  Other Topics Concern  . Not on file  Social History Narrative  . Not on file    Family History: Family History  Problem Relation Age of Onset  . Hypertension Mother   . Kidney disease Mother   . CVA Mother 45  . Kidney disease Brother  Review of Systems: Review of Systems  Constitutional: Negative for chills, fever and weight loss.  HENT: Negative.   Eyes: Negative.   Respiratory: Negative.  Negative for cough, shortness of breath and wheezing.   Cardiovascular: Negative.  Negative for chest pain and palpitations.  Gastrointestinal: Negative.   Genitourinary: Negative.   Musculoskeletal: Negative.   Skin: Negative.  Negative for rash.  Neurological: Negative.     Physical Exam: Vital Signs BP (!) 165/84 (BP Location: Right Arm, Patient Position: Sitting, Cuff Size: Large)   Pulse 64   Temp 98.4 F (36.9 C) (Oral)   Ht 5\' 10"  (1.778 m)   Wt 201 lb 3.2 oz (91.3 kg)   SpO2 99%   BMI 28.87 kg/m  Physical Exam Constitutional:      General: She is not in acute distress.    Appearance: Normal appearance. She is not ill-appearing or diaphoretic.  HENT:      Head: Normocephalic and atraumatic.  Neck:     Musculoskeletal: Normal range of motion and neck supple. No muscular tenderness.  Cardiovascular:     Rate and Rhythm: Normal rate and regular rhythm.     Pulses: Normal pulses.     Heart sounds: Normal heart sounds. No murmur.  Pulmonary:     Effort: Pulmonary effort is normal.     Breath sounds: Normal breath sounds.  Abdominal:     General: Abdomen is flat. There is no distension.     Palpations: Abdomen is soft.     Tenderness: There is no abdominal tenderness.  Musculoskeletal: Normal range of motion.  Lymphadenopathy:     Cervical: No cervical adenopathy.  Skin:    General: Skin is warm and dry.  Neurological:     General: No focal deficit present.     Mental Status: She is alert and oriented to person, place, and time.    Assessment/Plan: Plan for bilateral breast reduction.  Her preop bra size is 44 G and she would like to be a D cup.  Estimated excess breast tissue that should be removed at the time of surgery is about 900 g on the left and 900 on the right.  Patient should have her A1c checked prior to surgery, will send order.  Risks, benefits, and alternatives of procedure discussed, questions answered and consent obtained.    The risk that can be encountered with breast reduction were discussed and include the following but not limited to these:  Breast asymmetry, fluid accumulation, firmness of the breast, inability to breast feed, loss of nipple or areola, skin loss, decrease or no nipple sensation, fat necrosis of the breast tissue, bleeding, infection, healing delay.  There are risks of anesthesia, changes to skin sensation and injury to nerves or blood vessels.  The muscle can be temporarily or permanently injured.  You may have an allergic reaction to tape, suture, glue, blood products which can result in skin discoloration, swelling, pain, skin lesions, poor healing.  Any of these can lead to the need for revisonal  surgery or stage procedures.  A reduction has potential to interfere with diagnostic procedures.  Nipple or breast piercing can increase risks of infection.  This procedure is best done when the breast is fully developed.  Changes in the breast will continue to occur over time.  Pregnancy can alter the outcomes of previous breast reduction surgery, weight gain and weigh loss can also effect the long term appearance.    Electronically signed by: Carola Rhine Adelheid Hoggard, PA-C 04/07/2019  4:29 PM   Dr. Marla Roe Note: I have also seen the patient and spoken with her.  Answered all her questions and sent in her prescriptions to the pharmacy.

## 2019-04-13 ENCOUNTER — Telehealth: Payer: Self-pay | Admitting: Surgical

## 2019-04-13 DIAGNOSIS — N62 Hypertrophy of breast: Secondary | ICD-10-CM

## 2019-04-13 NOTE — Telephone Encounter (Signed)
Check hgb A1c pre op

## 2019-04-15 ENCOUNTER — Encounter (HOSPITAL_BASED_OUTPATIENT_CLINIC_OR_DEPARTMENT_OTHER): Payer: Self-pay | Admitting: *Deleted

## 2019-04-15 ENCOUNTER — Other Ambulatory Visit: Payer: Self-pay

## 2019-04-15 DIAGNOSIS — N62 Hypertrophy of breast: Secondary | ICD-10-CM

## 2019-04-20 ENCOUNTER — Other Ambulatory Visit (HOSPITAL_COMMUNITY)
Admission: RE | Admit: 2019-04-20 | Discharge: 2019-04-20 | Disposition: A | Discharge status: Home / Self Care | Payer: 59 | Source: Ambulatory Visit | Attending: Plastic Surgery | Admitting: Plastic Surgery

## 2019-04-20 ENCOUNTER — Other Ambulatory Visit: Payer: Self-pay

## 2019-04-20 ENCOUNTER — Encounter (HOSPITAL_BASED_OUTPATIENT_CLINIC_OR_DEPARTMENT_OTHER)
Admission: RE | Admit: 2019-04-20 | Discharge: 2019-04-20 | Disposition: A | Discharge status: Home / Self Care | Payer: 59 | Source: Ambulatory Visit | Attending: Plastic Surgery | Admitting: Plastic Surgery

## 2019-04-20 DIAGNOSIS — Z1159 Encounter for screening for other viral diseases: Secondary | ICD-10-CM | POA: Insufficient documentation

## 2019-04-20 DIAGNOSIS — Z01818 Encounter for other preprocedural examination: Secondary | ICD-10-CM | POA: Insufficient documentation

## 2019-04-20 DIAGNOSIS — N62 Hypertrophy of breast: Secondary | ICD-10-CM | POA: Insufficient documentation

## 2019-04-20 NOTE — Progress Notes (Signed)
Surgical soap given with instructions, pt verbalized understanding.  

## 2019-04-21 LAB — SARS CORONAVIRUS 2 (TAT 6-24 HRS): SARS Coronavirus 2: NEGATIVE

## 2019-04-23 ENCOUNTER — Encounter (HOSPITAL_BASED_OUTPATIENT_CLINIC_OR_DEPARTMENT_OTHER)
Admission: RE | Disposition: A | Discharge status: Home / Self Care | Payer: Self-pay | Source: Home / Self Care | Attending: Plastic Surgery

## 2019-04-23 ENCOUNTER — Ambulatory Visit (HOSPITAL_BASED_OUTPATIENT_CLINIC_OR_DEPARTMENT_OTHER): Payer: 59 | Admitting: Anesthesiology

## 2019-04-23 ENCOUNTER — Encounter (HOSPITAL_BASED_OUTPATIENT_CLINIC_OR_DEPARTMENT_OTHER): Payer: Self-pay | Admitting: Plastic Surgery

## 2019-04-23 ENCOUNTER — Other Ambulatory Visit: Payer: Self-pay

## 2019-04-23 ENCOUNTER — Ambulatory Visit (HOSPITAL_BASED_OUTPATIENT_CLINIC_OR_DEPARTMENT_OTHER)
Admission: RE | Admit: 2019-04-23 | Discharge: 2019-04-23 | Disposition: A | Discharge status: Home / Self Care | Payer: 59 | Attending: Plastic Surgery | Admitting: Plastic Surgery

## 2019-04-23 DIAGNOSIS — Z79899 Other long term (current) drug therapy: Secondary | ICD-10-CM | POA: Diagnosis not present

## 2019-04-23 DIAGNOSIS — Z841 Family history of disorders of kidney and ureter: Secondary | ICD-10-CM | POA: Insufficient documentation

## 2019-04-23 DIAGNOSIS — Z882 Allergy status to sulfonamides status: Secondary | ICD-10-CM | POA: Diagnosis not present

## 2019-04-23 DIAGNOSIS — M542 Cervicalgia: Secondary | ICD-10-CM

## 2019-04-23 DIAGNOSIS — I10 Essential (primary) hypertension: Secondary | ICD-10-CM | POA: Diagnosis not present

## 2019-04-23 DIAGNOSIS — Z90710 Acquired absence of both cervix and uterus: Secondary | ICD-10-CM | POA: Insufficient documentation

## 2019-04-23 DIAGNOSIS — M549 Dorsalgia, unspecified: Secondary | ICD-10-CM | POA: Insufficient documentation

## 2019-04-23 DIAGNOSIS — Z823 Family history of stroke: Secondary | ICD-10-CM | POA: Diagnosis not present

## 2019-04-23 DIAGNOSIS — N6489 Other specified disorders of breast: Secondary | ICD-10-CM | POA: Diagnosis not present

## 2019-04-23 DIAGNOSIS — N62 Hypertrophy of breast: Secondary | ICD-10-CM | POA: Insufficient documentation

## 2019-04-23 DIAGNOSIS — Z8249 Family history of ischemic heart disease and other diseases of the circulatory system: Secondary | ICD-10-CM | POA: Diagnosis not present

## 2019-04-23 DIAGNOSIS — E7800 Pure hypercholesterolemia, unspecified: Secondary | ICD-10-CM | POA: Diagnosis not present

## 2019-04-23 HISTORY — DX: Hypertrophy of breast: N62

## 2019-04-23 HISTORY — DX: Prediabetes: R73.03

## 2019-04-23 HISTORY — PX: BREAST REDUCTION SURGERY: SHX8

## 2019-04-23 SURGERY — MAMMOPLASTY, REDUCTION
Anesthesia: General | Site: Breast | Laterality: Bilateral

## 2019-04-23 MED ORDER — FENTANYL CITRATE (PF) 100 MCG/2ML IJ SOLN
50.0000 ug | INTRAMUSCULAR | Status: AC | PRN
  Administered 2019-04-23 (×4): 50 ug via INTRAVENOUS
  Administered 2019-04-23: 09:00:00 100 ug via INTRAVENOUS

## 2019-04-23 MED ORDER — SCOPOLAMINE 1 MG/3DAYS TD PT72
1.0000 | MEDICATED_PATCH | Freq: Once | TRANSDERMAL | Status: DC
  Administered 2019-04-23: 1.5 mg via TRANSDERMAL

## 2019-04-23 MED ORDER — SODIUM CHLORIDE 0.9 % IV SOLN
INTRAVENOUS | Status: AC
  Filled 2019-04-23: qty 500000

## 2019-04-23 MED ORDER — OXYCODONE HCL 5 MG PO TABS: 5.0000 mg | ORAL_TABLET | ORAL | Status: DC | PRN

## 2019-04-23 MED ORDER — PROPOFOL 10 MG/ML IV BOLUS
INTRAVENOUS | Status: DC | PRN
  Administered 2019-04-23: 150 mg via INTRAVENOUS

## 2019-04-23 MED ORDER — GABAPENTIN 300 MG PO CAPS
ORAL_CAPSULE | ORAL | Status: AC
  Filled 2019-04-23: qty 1

## 2019-04-23 MED ORDER — FENTANYL CITRATE (PF) 100 MCG/2ML IJ SOLN
INTRAMUSCULAR | Status: AC
  Filled 2019-04-23: qty 2

## 2019-04-23 MED ORDER — BUPIVACAINE-EPINEPHRINE (PF) 0.25% -1:200000 IJ SOLN
INTRAMUSCULAR | Status: AC
  Filled 2019-04-23: qty 30

## 2019-04-23 MED ORDER — SODIUM CHLORIDE 0.9 % IV SOLN
INTRAVENOUS | Status: DC | PRN
  Administered 2019-04-23: 500 mL

## 2019-04-23 MED ORDER — ONDANSETRON HCL 4 MG/2ML IJ SOLN
INTRAMUSCULAR | Status: AC
  Filled 2019-04-23: qty 2

## 2019-04-23 MED ORDER — SUCCINYLCHOLINE CHLORIDE 200 MG/10ML IV SOSY
PREFILLED_SYRINGE | INTRAVENOUS | Status: AC
  Filled 2019-04-23: qty 10

## 2019-04-23 MED ORDER — GABAPENTIN 300 MG PO CAPS
300.0000 mg | ORAL_CAPSULE | Freq: Once | ORAL | Status: AC
  Administered 2019-04-23: 300 mg via ORAL

## 2019-04-23 MED ORDER — ACETAMINOPHEN 500 MG PO TABS
ORAL_TABLET | ORAL | Status: AC
  Filled 2019-04-23: qty 2

## 2019-04-23 MED ORDER — CEFAZOLIN SODIUM-DEXTROSE 2-4 GM/100ML-% IV SOLN
INTRAVENOUS | Status: AC
  Filled 2019-04-23: qty 100

## 2019-04-23 MED ORDER — ACETAMINOPHEN 650 MG RE SUPP: 650.0000 mg | RECTAL | Status: DC | PRN

## 2019-04-23 MED ORDER — SODIUM CHLORIDE 0.9% FLUSH: 3.0000 mL | Freq: Two times a day (BID) | INTRAVENOUS | Status: DC

## 2019-04-23 MED ORDER — SODIUM CHLORIDE 0.9 % IR SOLN
Status: DC | PRN
  Administered 2019-04-23: 50 mL
  Administered 2019-04-23: 1000 mL

## 2019-04-23 MED ORDER — EPHEDRINE SULFATE 50 MG/ML IJ SOLN
INTRAMUSCULAR | Status: DC | PRN
  Administered 2019-04-23: 10 mg via INTRAVENOUS

## 2019-04-23 MED ORDER — ACETAMINOPHEN 325 MG PO TABS: 650.0000 mg | ORAL_TABLET | ORAL | Status: DC | PRN

## 2019-04-23 MED ORDER — CEFAZOLIN SODIUM-DEXTROSE 2-4 GM/100ML-% IV SOLN
2.0000 g | INTRAVENOUS | Status: AC
  Administered 2019-04-23: 2 g via INTRAVENOUS

## 2019-04-23 MED ORDER — LACTATED RINGERS IV SOLN
INTRAVENOUS | Status: DC | PRN
  Administered 2019-04-23: 08:00:00 via INTRAVENOUS
  Administered 2019-04-23: 12:00:00
  Administered 2019-04-23: 09:00:00 via INTRAVENOUS

## 2019-04-23 MED ORDER — MIDAZOLAM HCL 2 MG/2ML IJ SOLN
1.0000 mg | INTRAMUSCULAR | Status: DC | PRN
  Administered 2019-04-23: 09:00:00 2 mg via INTRAVENOUS

## 2019-04-23 MED ORDER — LIDOCAINE HCL (CARDIAC) PF 100 MG/5ML IV SOSY
PREFILLED_SYRINGE | INTRAVENOUS | Status: DC | PRN
  Administered 2019-04-23: 100 mg via INTRAVENOUS

## 2019-04-23 MED ORDER — PHENYLEPHRINE 40 MCG/ML (10ML) SYRINGE FOR IV PUSH (FOR BLOOD PRESSURE SUPPORT)
PREFILLED_SYRINGE | INTRAVENOUS | Status: AC
  Filled 2019-04-23: qty 10

## 2019-04-23 MED ORDER — SODIUM CHLORIDE 0.9 % IV SOLN: 250.0000 mL | INTRAVENOUS | Status: DC | PRN

## 2019-04-23 MED ORDER — BUPIVACAINE HCL (PF) 0.25 % IJ SOLN
INTRAMUSCULAR | Status: AC
  Filled 2019-04-23: qty 30

## 2019-04-23 MED ORDER — PROMETHAZINE HCL 25 MG/ML IJ SOLN: 6.2500 mg | INTRAMUSCULAR | Status: DC | PRN

## 2019-04-23 MED ORDER — ACETAMINOPHEN 500 MG PO TABS
1000.0000 mg | ORAL_TABLET | Freq: Once | ORAL | Status: AC
  Administered 2019-04-23: 1000 mg via ORAL

## 2019-04-23 MED ORDER — SCOPOLAMINE 1 MG/3DAYS TD PT72
MEDICATED_PATCH | TRANSDERMAL | Status: AC
  Filled 2019-04-23: qty 1

## 2019-04-23 MED ORDER — CHLORHEXIDINE GLUCONATE CLOTH 2 % EX PADS: 6.0000 | MEDICATED_PAD | Freq: Once | CUTANEOUS | Status: DC

## 2019-04-23 MED ORDER — EPHEDRINE 5 MG/ML INJ
INTRAVENOUS | Status: AC
  Filled 2019-04-23: qty 10

## 2019-04-23 MED ORDER — SUGAMMADEX SODIUM 200 MG/2ML IV SOLN
INTRAVENOUS | Status: DC | PRN
  Administered 2019-04-23: 200 mg via INTRAVENOUS

## 2019-04-23 MED ORDER — BUPIVACAINE-EPINEPHRINE 0.25% -1:200000 IJ SOLN
INTRAMUSCULAR | Status: DC | PRN
  Administered 2019-04-23 (×2): 30 mL

## 2019-04-23 MED ORDER — ROCURONIUM BROMIDE 10 MG/ML (PF) SYRINGE
PREFILLED_SYRINGE | INTRAVENOUS | Status: AC
  Filled 2019-04-23: qty 10

## 2019-04-23 MED ORDER — PROPOFOL 500 MG/50ML IV EMUL
INTRAVENOUS | Status: AC
  Filled 2019-04-23: qty 50

## 2019-04-23 MED ORDER — EPINEPHRINE PF 1 MG/ML IJ SOLN
INTRAMUSCULAR | Status: AC
  Filled 2019-04-23: qty 1

## 2019-04-23 MED ORDER — DEXAMETHASONE SODIUM PHOSPHATE 10 MG/ML IJ SOLN
INTRAMUSCULAR | Status: AC
  Filled 2019-04-23: qty 1

## 2019-04-23 MED ORDER — ONDANSETRON HCL 4 MG/2ML IJ SOLN
INTRAMUSCULAR | Status: DC | PRN
  Administered 2019-04-23: 4 mg via INTRAVENOUS

## 2019-04-23 MED ORDER — SODIUM CHLORIDE 0.9% FLUSH: 3.0000 mL | INTRAVENOUS | Status: DC | PRN

## 2019-04-23 MED ORDER — FENTANYL CITRATE (PF) 100 MCG/2ML IJ SOLN: 25.0000 ug | INTRAMUSCULAR | Status: DC | PRN

## 2019-04-23 MED ORDER — SODIUM CHLORIDE (PF) 0.9 % IJ SOLN
INTRAMUSCULAR | Status: AC
  Filled 2019-04-23: qty 10

## 2019-04-23 MED ORDER — DEXAMETHASONE SODIUM PHOSPHATE 4 MG/ML IJ SOLN
INTRAMUSCULAR | Status: DC | PRN
  Administered 2019-04-23: 10 mg via INTRAVENOUS

## 2019-04-23 MED ORDER — LIDOCAINE 2% (20 MG/ML) 5 ML SYRINGE
INTRAMUSCULAR | Status: AC
  Filled 2019-04-23: qty 5

## 2019-04-23 MED ORDER — SUGAMMADEX SODIUM 500 MG/5ML IV SOLN
INTRAVENOUS | Status: AC
  Filled 2019-04-23: qty 5

## 2019-04-23 MED ORDER — BUPIVACAINE-EPINEPHRINE (PF) 0.5% -1:200000 IJ SOLN
INTRAMUSCULAR | Status: AC
  Filled 2019-04-23: qty 30

## 2019-04-23 MED ORDER — MIDAZOLAM HCL 2 MG/2ML IJ SOLN
INTRAMUSCULAR | Status: AC
  Filled 2019-04-23: qty 2

## 2019-04-23 MED ORDER — LIDOCAINE HCL (PF) 1 % IJ SOLN
INTRAMUSCULAR | Status: AC
  Filled 2019-04-23: qty 30

## 2019-04-23 MED ORDER — ROCURONIUM BROMIDE 100 MG/10ML IV SOLN
INTRAVENOUS | Status: DC | PRN
  Administered 2019-04-23: 10 mg via INTRAVENOUS
  Administered 2019-04-23: 50 mg via INTRAVENOUS

## 2019-04-23 SURGICAL SUPPLY — 70 items
BAG DECANTER FOR FLEXI CONT (MISCELLANEOUS) ×3 IMPLANT
BINDER BREAST 3XL (GAUZE/BANDAGES/DRESSINGS) ×3 IMPLANT
BINDER BREAST LRG (GAUZE/BANDAGES/DRESSINGS) IMPLANT
BINDER BREAST MEDIUM (GAUZE/BANDAGES/DRESSINGS) IMPLANT
BINDER BREAST XLRG (GAUZE/BANDAGES/DRESSINGS) IMPLANT
BINDER BREAST XXLRG (GAUZE/BANDAGES/DRESSINGS) IMPLANT
BIOPATCH RED 1 DISK 7.0 (GAUZE/BANDAGES/DRESSINGS) ×4 IMPLANT
BIOPATCH RED 1IN DISK 7.0MM (GAUZE/BANDAGES/DRESSINGS) ×2
BLADE HEX COATED 2.75 (ELECTRODE) ×3 IMPLANT
BLADE KNIFE PERSONA 10 (BLADE) ×12 IMPLANT
BLADE SURG 15 STRL LF DISP TIS (BLADE) IMPLANT
BLADE SURG 15 STRL SS (BLADE)
BNDG GAUZE ELAST 4 BULKY (GAUZE/BANDAGES/DRESSINGS) IMPLANT
CANISTER SUCT 1200ML W/VALVE (MISCELLANEOUS) ×3 IMPLANT
CHLORAPREP W/TINT 26 (MISCELLANEOUS) ×3 IMPLANT
COVER BACK TABLE REUSABLE LG (DRAPES) ×3 IMPLANT
COVER MAYO STAND REUSABLE (DRAPES) ×3 IMPLANT
COVER WAND RF STERILE (DRAPES) IMPLANT
DECANTER SPIKE VIAL GLASS SM (MISCELLANEOUS) IMPLANT
DERMABOND ADVANCED (GAUZE/BANDAGES/DRESSINGS) ×8
DERMABOND ADVANCED .7 DNX12 (GAUZE/BANDAGES/DRESSINGS) ×4 IMPLANT
DRAIN CHANNEL 15F RND FF W/TCR (WOUND CARE) ×6 IMPLANT
DRAIN CHANNEL 19F RND (DRAIN) IMPLANT
DRAPE HALF SHEET 70X43 (DRAPES) ×3 IMPLANT
DRAPE LAPAROSCOPIC ABDOMINAL (DRAPES) ×3 IMPLANT
DRSG PAD ABDOMINAL 8X10 ST (GAUZE/BANDAGES/DRESSINGS) ×12 IMPLANT
ELECT BLADE 4.0 EZ CLEAN MEGAD (MISCELLANEOUS)
ELECT REM PT RETURN 9FT ADLT (ELECTROSURGICAL) ×3
ELECTRODE BLDE 4.0 EZ CLN MEGD (MISCELLANEOUS) IMPLANT
ELECTRODE REM PT RTRN 9FT ADLT (ELECTROSURGICAL) ×1 IMPLANT
EVACUATOR SILICONE 100CC (DRAIN) ×6 IMPLANT
GAUZE SPONGE 4X4 12PLY STRL (GAUZE/BANDAGES/DRESSINGS) ×3 IMPLANT
GAUZE SPONGE 4X4 12PLY STRL LF (GAUZE/BANDAGES/DRESSINGS) ×3 IMPLANT
GLOVE BIO SURGEON STRL SZ 6.5 (GLOVE) ×8 IMPLANT
GLOVE BIO SURGEON STRL SZ7 (GLOVE) ×3 IMPLANT
GLOVE BIO SURGEONS STRL SZ 6.5 (GLOVE) ×4
GOWN STRL REUS W/ TWL LRG LVL3 (GOWN DISPOSABLE) ×4 IMPLANT
GOWN STRL REUS W/TWL LRG LVL3 (GOWN DISPOSABLE) ×8
MARKER SKIN DUAL TIP RULER LAB (MISCELLANEOUS) ×3 IMPLANT
NDL SAFETY ECLIPSE 18X1.5 (NEEDLE) IMPLANT
NEEDLE HYPO 18GX1.5 SHARP (NEEDLE)
NEEDLE HYPO 25X1 1.5 SAFETY (NEEDLE) ×3 IMPLANT
NS IRRIG 1000ML POUR BTL (IV SOLUTION) ×3 IMPLANT
PACK BASIN DAY SURGERY FS (CUSTOM PROCEDURE TRAY) ×3 IMPLANT
PAD ALCOHOL SWAB (MISCELLANEOUS) IMPLANT
PENCIL BUTTON HOLSTER BLD 10FT (ELECTRODE) ×3 IMPLANT
PIN SAFETY STERILE (MISCELLANEOUS) ×3 IMPLANT
SLEEVE SCD COMPRESS KNEE MED (MISCELLANEOUS) ×3 IMPLANT
SPONGE LAP 18X18 RF (DISPOSABLE) ×12 IMPLANT
STRIP SUTURE WOUND CLOSURE 1/2 (SUTURE) ×18 IMPLANT
SUT MNCRL AB 4-0 PS2 18 (SUTURE) ×24 IMPLANT
SUT MON AB 3-0 SH 27 (SUTURE) ×6
SUT MON AB 3-0 SH27 (SUTURE) ×3 IMPLANT
SUT MON AB 5-0 PS2 18 (SUTURE) ×12 IMPLANT
SUT PDS 3-0 CT2 (SUTURE)
SUT PDS AB 2-0 CT2 27 (SUTURE) IMPLANT
SUT PDS II 3-0 CT2 27 ABS (SUTURE) IMPLANT
SUT SILK 3 0 PS 1 (SUTURE) ×6 IMPLANT
SYR 3ML 23GX1 SAFETY (SYRINGE) ×3 IMPLANT
SYR 50ML LL SCALE MARK (SYRINGE) IMPLANT
SYR BULB IRRIGATION 50ML (SYRINGE) ×6 IMPLANT
SYR CONTROL 10ML LL (SYRINGE) ×6 IMPLANT
TAPE MEASURE VINYL STERILE (MISCELLANEOUS) ×3 IMPLANT
TOWEL GREEN STERILE FF (TOWEL DISPOSABLE) ×9 IMPLANT
TUBE CONNECTING 20'X1/4 (TUBING) ×2
TUBE CONNECTING 20X1/4 (TUBING) ×4 IMPLANT
TUBING INFILTRATION IT-10001 (TUBING) IMPLANT
TUBING SET GRADUATE ASPIR 12FT (MISCELLANEOUS) IMPLANT
UNDERPAD 30X30 (UNDERPADS AND DIAPERS) ×6 IMPLANT
YANKAUER SUCT BULB TIP NO VENT (SUCTIONS) ×3 IMPLANT

## 2019-04-23 NOTE — Interval H&P Note (Signed)
History and Physical Interval Note:  04/23/2019 8:10 AM  Tara Holland  has presented today for surgery, with the diagnosis of Mammary Hypertrophy.  The various methods of treatment have been discussed with the patient and family. After consideration of risks, benefits and other options for treatment, the patient has consented to  Procedure(s) with comments: MAMMARY REDUCTION  (BREAST) (Bilateral) - 4 hours, please as a surgical intervention.  The patient's history has been reviewed, patient examined, no change in status, stable for surgery.  I have reviewed the patient's chart and labs.  Questions were answered to the patient's satisfaction.     Loel Lofty Dillingham

## 2019-04-23 NOTE — Transfer of Care (Signed)
Immediate Anesthesia Transfer of Care Note  Patient: Tara Holland  Procedure(s) Performed: MAMMARY REDUCTION  (BREAST) (Bilateral Breast)  Patient Location: PACU  Anesthesia Type:General  Level of Consciousness: awake, alert , oriented and drowsy  Airway & Oxygen Therapy: Patient Spontanous Breathing and Patient connected to face mask oxygen  Post-op Assessment: Report given to RN and Post -op Vital signs reviewed and stable  Post vital signs: Reviewed and stable  Last Vitals:  Vitals Value Taken Time  BP 150/97 04/23/19 1157  Temp    Pulse 92 04/23/19 1158  Resp 17 04/23/19 1158  SpO2 99 % 04/23/19 1158  Vitals shown include unvalidated device data.  Last Pain:  Vitals:   04/23/19 0732  TempSrc: Oral  PainSc: 0-No pain         Complications: No apparent anesthesia complications

## 2019-04-23 NOTE — Anesthesia Preprocedure Evaluation (Addendum)
Anesthesia Evaluation  Patient identified by MRN, date of birth, ID band Patient awake    Reviewed: Allergy & Precautions, NPO status , Patient's Chart, lab work & pertinent test results  Airway Mallampati: III  TM Distance: >3 FB Neck ROM: Full    Dental  (+) Teeth Intact, Dental Advisory Given   Pulmonary neg pulmonary ROS,    Pulmonary exam normal breath sounds clear to auscultation       Cardiovascular hypertension, Pt. on medications (-) angina(-) CAD, (-) Past MI and (-) Cardiac Stents Normal cardiovascular exam Rhythm:Regular Rate:Normal     Neuro/Psych negative neurological ROS  negative psych ROS   GI/Hepatic negative GI ROS, Neg liver ROS,   Endo/Other  negative endocrine ROS  Renal/GU negative Renal ROS     Musculoskeletal negative musculoskeletal ROS (+)   Abdominal   Peds  Hematology negative hematology ROS (+)   Anesthesia Other Findings Day of surgery medications reviewed with the patient.  Reproductive/Obstetrics                           Anesthesia Physical Anesthesia Plan  ASA: II  Anesthesia Plan: General   Post-op Pain Management:    Induction: Intravenous  PONV Risk Score and Plan: 3 and Diphenhydramine, Midazolam, Scopolamine patch - Pre-op, Dexamethasone and Ondansetron  Airway Management Planned: Oral ETT  Additional Equipment:   Intra-op Plan:   Post-operative Plan: Extubation in OR  Informed Consent: I have reviewed the patients History and Physical, chart, labs and discussed the procedure including the risks, benefits and alternatives for the proposed anesthesia with the patient or authorized representative who has indicated his/her understanding and acceptance.     Dental advisory given  Plan Discussed with: CRNA  Anesthesia Plan Comments:        Anesthesia Quick Evaluation

## 2019-04-23 NOTE — Op Note (Addendum)
Breast Reduction Op note:    DATE OF PROCEDURE: 04/23/2019  LOCATION: [place]  SURGEON: Lyndee Leo Sanger Alexanderia Gorby, DO  ASSISTANT: [place], RNFA, Matt Scheeler, [place]  PREOPERATIVE DIAGNOSIS 1. Macromastia 2. Neck Pain 3. Back Pain  POSTOPERATIVE DIAGNOSIS 1. Macromastia 2. Neck Pain 3. Back Pain  PROCEDURES 1. Bilateral breast reduction.  Right reduction 978g, Left reduction 378H  COMPLICATIONS: None.  DRAINS: bilateral  INDICATIONS FOR PROCEDURE Tara Holland is a 58 y.o. year-old female born on 1961/09/08,with a history of symptomatic macromastia with concominant back pain, neck pain, shoulder grooving from her bra.   MRN: [id number]  CONSENT Informed consent was obtained directly from the patient. The risks, benefits and alternatives were fully discussed. Specific risks including but not limited to bleeding, infection, hematoma, seroma, scarring, pain, nipple necrosis, asymmetry, poor cosmetic results, and need for further surgery were discussed. The patient had ample opportunity to have her questions answered to her satisfaction.  DESCRIPTION OF PROCEDURE  Patient was brought into the operating room and placed in a supine position.  SCDs were placed and appropriate padding was performed.  Antibiotics were given. The patient underwent general anesthesia and the chest was prepped and draped in a sterile fashion.  A timeout was performed and all information was confirmed to be correct.  Right side: Preoperative markings were confirmed.  Incision lines were injected with 1% Xylocaine with epinephrine.  After waiting for vasoconstriction, the marked lines were incised.  A Wise-pattern superomedial breast reduction was performed by de-epithelializing the pedicle, using bovie to create the superomedial pedicle, and removing breast tissue from the superior, lateral, and inferior portions of the breast.  Care was taken to not undermine the breast pedicle.  Hemostasis was achieved. A drain was placed and secured to the skin with a 4-0 Silk. The nipple was gently rotated into position and the soft tissue closed with 4-0 Monocryl.   The pocket was irrigated and hemostasis confirmed.  The deep tissues were approximated with 3-0 monocryl sutures and the skin was closed with deep dermal and subcuticular 4-0 Monocryl sutures.  The nipple and skin flaps had good capillary refill at the end of the procedure.    Left side: Preoperative markings were confirmed.  Incision lines were injected with 1% Xylocaine with epinephrine.  After waiting for vasoconstriction, the marked lines were incised.  A Wise-pattern superomedial breast reduction was performed by de-epithelializing the pedicle, using bovie to create the superomedial pedicle, and removing breast tissue from the superior, lateral, and inferior portions of the breast.  Care was taken to not undermine the breast pedicle. Hemostasis was achieved.  The nipple was gently rotated into position and the soft tissue was closed with 4-0 Monocryl.  A drain was placed and secured to the skin with the 4-0 Silk. The patient was sat upright and size and shape symmetry was confirmed.  The pocket was irrigated and hemostasis confirmed.  The deep tissues were approximated with 3-0 monocryl sutures and the skin was closed with deep dermal and subcuticular 4-0 Monocryl sutures.  Dermabond was applied.  A breast binder and ABDs were placed.  The nipple and skin flaps had good capillary refill at the end of the procedure.  The patient tolerated the procedure well. The patient was allowed to wake from anesthesia and taken to the recovery room in satisfactory condition.  The RNFA assisted throughout the case.  The RNFA was essential in retraction and counter traction when needed to make the case progress  smoothly.  This retraction and assistance made it possible to see the tissue plans for the procedure.  The assistance was needed for blood  control, tissue re-approximation and assisted with closure of the incision site.

## 2019-04-23 NOTE — OR Nursing (Signed)
Right breast 977.9 grams tissue excised Left breast 982.6 grams tissue excised

## 2019-04-23 NOTE — Anesthesia Procedure Notes (Signed)
Procedure Name: Intubation Date/Time: 04/23/2019 8:43 AM Performed by: Willa Frater, CRNA Pre-anesthesia Checklist: Patient identified, Emergency Drugs available, Suction available and Patient being monitored Patient Re-evaluated:Patient Re-evaluated prior to induction Oxygen Delivery Method: Circle system utilized Preoxygenation: Pre-oxygenation with 100% oxygen Induction Type: IV induction Ventilation: Mask ventilation without difficulty Laryngoscope Size: Mac and 3 Grade View: Grade I Tube type: Oral Number of attempts: 1 Airway Equipment and Method: Stylet and Oral airway Placement Confirmation: ETT inserted through vocal cords under direct vision,  positive ETCO2 and breath sounds checked- equal and bilateral Secured at: 22 cm Tube secured with: Tape Dental Injury: Teeth and Oropharynx as per pre-operative assessment

## 2019-04-23 NOTE — Anesthesia Postprocedure Evaluation (Signed)
Anesthesia Post Note  Patient: Tara Holland  Procedure(s) Performed: MAMMARY REDUCTION  (BREAST) (Bilateral Breast)     Patient location during evaluation: PACU Anesthesia Type: General Level of consciousness: awake and alert Pain management: pain level controlled Vital Signs Assessment: post-procedure vital signs reviewed and stable Respiratory status: spontaneous breathing, nonlabored ventilation, respiratory function stable and patient connected to nasal cannula oxygen Cardiovascular status: blood pressure returned to baseline and stable Postop Assessment: no apparent nausea or vomiting Anesthetic complications: no    Last Vitals:  Vitals:   04/23/19 1230 04/23/19 1312  BP: (!) 136/93 (!) 126/92  Pulse: 85 86  Resp: 13 20  Temp:  36.9 C  SpO2: 95% 99%    Last Pain:  Vitals:   04/23/19 1312  TempSrc: Oral  PainSc:                  Catalina Gravel

## 2019-04-23 NOTE — Discharge Instructions (Addendum)
Post Anesthesia Home Care Instructions  Activity: Get plenty of rest for the remainder of the day. A responsible individual must stay with you for 24 hours following the procedure.  For the next 24 hours, DO NOT: -Drive a car -Paediatric nurse -Drink alcoholic beverages -Take any medication unless instructed by your physician -Make any legal decisions or sign important papers.  Meals: Start with liquid foods such as gelatin or soup. Progress to regular foods as tolerated. Avoid greasy, spicy, heavy foods. If nausea and/or vomiting occur, drink only clear liquids until the nausea and/or vomiting subsides. Call your physician if vomiting continues.  Special Instructions/Symptoms: Your throat may feel dry or sore from the anesthesia or the breathing tube placed in your throat during surgery. If this causes discomfort, gargle with warm salt water. The discomfort should disappear within 24 hours.  If you had a scopolamine patch placed behind your ear for the management of post- operative nausea and/or vomiting:  1. The medication in the patch is effective for 72 hours, after which it should be removed.  Wrap patch in a tissue and discard in the trash. Wash hands thoroughly with soap and water. 2. You may remove the patch earlier than 72 hours if you experience unpleasant side effects which may include dry mouth, dizziness or visual disturbances. 3. Avoid touching the patch. Wash your hands with soap and water after contact with the patch.    INSTRUCTIONS FOR AFTER SURGERY   You are having surgery.  You will likely have some questions about what to expect following your operation.  The following information will help you and your family understand what to expect when you are discharged from the hospital.  Following these guidelines will help ensure a smooth recovery and reduce risks of complications.   Postoperative instructions include information on: diet, wound care, medications and  physical activity.  AFTER SURGERY Expect to go home after the procedure.  In some cases, you may need to spend one night in the hospital for observation.  DIET This surgery does not require a specific diet.  However, I have to mention that the healthier you eat the better your body can start healing. It is important to increasing your protein intake.  This means limiting the foods with sugar and carbohydrates.  Focus on vegetables and some meat.  If you have any liposuction during your procedure be sure to drink water.  If your urine is bright yellow, then it is concentrated, and you need to drink more water.  As a general rule after surgery, you should have 8 ounces of water every hour while awake.  If you find you are persistently nauseated or unable to take in liquids let us know.  NO TOBACCO USE or EXPOSURE.  This will slow your healing process and increase the risk of a wound.  WOUND CARE If you don't have a drain: You can shower the day after surgery. Use fragrance free soap.  Dial, [place] and [place] are usually mild on the skin. If you have a drain: Clean with baby wipes until the drain is removed or at least several days.  If you have steri-strips / tape directly attached to your skin leave them in place. It is OK to get these wet.  No baths, pools or hot tubs for two weeks. We close your incision to leave the smallest and best-looking scar. No ointment or creams on your incisions until given the go ahead.  Especially not Neosporin (Too many skin reactions  with this one).  A few weeks after surgery you can use Mederma and start massaging the scar. We ask you to wear your binder or sports bra for the first 6 weeks around the clock, including while sleeping. This provides added comfort and helps reduce the fluid accumulation at the surgery site.  ACTIVITY No heavy lifting until cleared by the doctor.  It is OK to walk and climb stairs. In fact, moving your legs is very important to decrease your  risk of a blood clot.  It will also help keep you from getting deconditioned.  Every 1 to 2 hours get up and walk for 5 minutes. This will help with a quicker recovery back to normal.  Let pain be your guide so you don't do too much.  NO, you cannot do the spring cleaning and don't plan on taking care of anyone else.  This is your time for TLC.  You will be more comfortable if you sleep and rest with your head elevated either with a few pillows under you or in a recliner.  No stomach sleeping for a few months.  WORK Everyone returns to work at different times. As a rough guide, most people take at least 1 - 2 weeks off prior to returning to work. If you need documentation for your job, bring the forms to your postoperative follow up visit.  DRIVING Arrange for someone to bring you home from the hospital.  You may be able to drive a few days after surgery but not while taking any narcotics or valium.  BOWEL MOVEMENTS Constipation can occur after anesthesia and while taking pain medication.  It is important to stay ahead for your comfort.  We recommend taking Milk of Magnesia (2 tablespoons; twice a day) while taking the pain pills.  SEROMA This is fluid your body tried to put in the surgical site.  This is normal but if it creates tight skinny skin let us know.  It usually decreases in a few weeks.  WHEN TO CALL Call your surgeon's office if any of the following occur:  Fever 101 degrees F or greater  Excessive bleeding or fluid from the incision site.  Pain that increases over time without aid from the medications  Redness, warmth, or pus draining from incision sites  Persistent nausea or inability to take in liquids  Severe misshapen area that underwent the operation.   [facility] Surgery Specialist  What is the benefit of having a drain?  During surgery your tissue is separated between layers like the subcutaneous tissue and muscle.  This raw surface stimulates your body to  fill the space with serous fluid.  This is normal but you don't want that fluid to collect and prevent healing.  A fluid collection can also become infected.  The Jackson-Pratt (JP) drain is used to eliminate this collection of fluid and allow the tissue to heal together.    Jackson-Pratt (JP) bulb    How to care for your drainage and suction unit at home Your drainage catheter will be connected to a collection device. The vacuum caused when the device is compressed allows drainage to collect in the device.     Wash your hands with soap and water before and after touching the system.  Empty the JP drain every 12 hours once you get home from your procedure.  Record the fluid amount on the record sheet included.  Start with stripping the drain tube to push the clots to the bulb.  Do this by pinching the tube with one hand near your skin.  Then with the other hand squeeze the tubing and work it toward the bulb.  This should be done several times a day.  This may collapse the tube which will correct on its own.    Use a safety pin to attach your collection device to your clothing so there is no tension on the insertion site.    If you have drainage at the skin insertion site, you can apply a gauze dressing and secure it with tape.  If the drain falls out, apply a gauze dressing over the drain insertion site and secure with tape.   To empty the collection device:    Release the stopper on the top of the collection unit (bulb).   Pour contents into a measuring container such as a plastic medicine cup.   Record the day and amount of drainage on the attached sheet.  This should be done at least twice a day.    To compress the Jackson-Pratt Bulb:   Release the stopper at the top of the bulb.  Squeeze the bulb tightly in your fist, squeezing air out of the bulb.   Replace the stopper while the bulb is compressed.   Be careful not to spill the contents when squeezing the bulb.  The  drainage will start bright red and turn to pink and then yellow with time.  IMPORTANT: If the bulb is not squeezed before adding the stopper it will not draw out the fluid.  Care for the JP drain site and your skin daily:   You may shower three days after surgery.  Secure the drain to a ribbon or cloth around your waist while showering so it does not pull out while showering.  Be sure your hands are cleaned with soap and water.  Use a clean wet cotton swab to clean the skin around the drain site.   Use another cotton swab to place Vaseline or antibiotic ointment on the skin around the drain.     Contact your physician if any of the following occur:   The fluid in the bulb becomes cloudy.  Your temperature is greater than 101.4.   The incision opens.  If you have drainage at the skin insertion site, you can apply a gauze dressing and secure it with tape.  If the drain falls out, apply a gauze dressing over the drain insertion site and secure with tape.   You will usually have more drainage when you are active than while you rest or are asleep. If the drainage increases significantly or is bloody call the physician                             Bring this record with you to each office visit Date  Drainage Volume  Date   Drainage volume

## 2019-04-24 ENCOUNTER — Encounter (HOSPITAL_BASED_OUTPATIENT_CLINIC_OR_DEPARTMENT_OTHER): Payer: Self-pay | Admitting: Plastic Surgery

## 2019-04-27 ENCOUNTER — Telehealth: Payer: Self-pay

## 2019-04-27 NOTE — Telephone Encounter (Signed)
Call from pt re: asymmetry noted of right breast- she describes the shape as "firm" fullness above the nipple & below the nipple with a "flattening" appearance at the nipple/aerola complex She denies any increase in pain, no redness, no fever or other complication Left breast is more symmetrical Drainage from drains= right(20-70mls) per 24 hrs & left(10-36mls) per 24hrs She continues to wear the breast/chest garment  I informed her that some asymmetry postop is not unusual- d/t swelling & can be different from side to side We discussed the fact that when the swelling subsides the breast will usually become more rounded & less "mishaped" I instructed her to continue to monitor for any other complications- like increased swelling or other changes- she will call for any concerns She has a postop visit with Dr. Marla Roe Friday 05/01/19

## 2019-04-29 ENCOUNTER — Telehealth: Payer: Self-pay | Admitting: Plastic Surgery

## 2019-04-29 NOTE — Telephone Encounter (Signed)
Spoke with Tara Holland this morning on the phone.  She had called the office with some questions about her right breast being tender and having a stinging pain intermittently.   She also reported that she has some concerns of the right side appears different than the left side.  She noted that the right areola looks flatter than the left.  I told her this is not uncommon and it can take some time for the breast to find its final shape after reduction.  She did not notice any redness, drainage, or signs of infection.  She denies any fevers or chills.  She had a question about wearing her chest garment and sleeping arrangements.  She stated that she had some pain when lying completely flat on her back, we talked about sleeping in a more upright position with a pillow under her back to help decrease the pressure on her chest.  She said that she did try that and it worked out quite well so she will continue to do that if she feels it is necessary.  She can continue to wear the chest garment/binder throughout the day and take it off at times if she would like, but to leave it on throughout most of the day and night.  She said that her drains are still in place and she has emptied out approximately 10 to 20 cc for the past 2 days.  We are planning to see her on Friday, 05/01/2019.  She was instructed to call back with any questions or concerns.  She understands to call if she notices significant changes in the shape of her breast, fever, chills, temperature greater than 100.4 Fahrenheit, bleeding, purulent/pus-like drainage. She voiced understanding.  She knows to call with any other questions or concerns, we will be happy to help her.

## 2019-04-29 NOTE — Telephone Encounter (Signed)
Patient called to follow up from patient message she sent yesterday. She states her right breast is tender to touch and she is now having a "stinging" pain in it. She would like to know if she can take off the chest garment, and also if anything can be done before her visit on Friday 05/01/2019.

## 2019-04-30 ENCOUNTER — Telehealth: Payer: Self-pay | Admitting: Plastic Surgery

## 2019-04-30 NOTE — Telephone Encounter (Signed)

## 2019-04-30 NOTE — Progress Notes (Signed)
Subjective:     Patient ID: Tara Holland, female    DOB: Nov 28, 1960, 58 y.o.   MRN: [id number]  Chief Complaint  Patient presents with   Post-op Follow-up    for (B) breast reduction    HPI: The patient is a 58 y.o. female here for follow-up after bilateral breast reduction.  She is 8 days status postop.  Tara Holland reports that she has been doing well overall.  She has been a little nervous because this is her first major surgery.  We had spoken over the phone yesterday in regards to questions about her nipple areola being a little tender with some stinging pain.  She also had questions about her right breast being different in shape than her left breast.  On exam today in the office she notes that she has been emptying her drains every 12 hours.  She has been keeping notes and reports that it is approximately 20 cc every 12 hours.  The fluid appears to be serosanguineous bilaterally, with the right being more sanguinous.  Today in the office she has about 25 cc on the left and 20 cc on the right.  It was decided to leave the drains in place due to the high volume of fluid.  Her incisions are healing well with no sign of infection.  She denies any fevers or chills.  Review of Systems  Constitutional: Negative for chills, fever and malaise/fatigue.  HENT: Negative.   Respiratory: Negative.   Cardiovascular: Negative.   Genitourinary: Negative.   Musculoskeletal: Negative.   Skin: Negative for itching and rash.  Neurological: Negative.   Psychiatric/Behavioral: Negative.     Objective:   Vital Signs BP (!) 157/89 (BP Location: Left Arm, Patient Position: Sitting, Cuff Size: Large)    Pulse 62    Temp 97.8 F (36.6 C) (Temporal)    Ht 5\' 10"  (1.778 m)    Wt 195 lb 3.2 oz (88.5 kg)    SpO2 96%    BMI 28.01 kg/m  Vital Signs and Nursing Note Reviewed Chaperone present Physical Exam  Constitutional: She is oriented to person, place, and time and well-developed,  well-nourished, and in no distress. No distress.  HENT:  Head: Normocephalic and atraumatic.  Neck: Normal range of motion.  Cardiovascular: Normal rate.  Pulmonary/Chest: Effort normal. Right breast exhibits tenderness. Right breast exhibits no nipple discharge. Left breast exhibits tenderness. Left breast exhibits no nipple discharge. Breasts are asymmetrical.    Musculoskeletal: Normal range of motion.        General: No edema.  Neurological: She is alert and oriented to person, place, and time. Gait normal.  Skin: Skin is warm and dry. No rash noted. She is not diaphoretic. No erythema. No pallor.  Psychiatric: Mood and affect normal.    Assessment/Plan:     ICD-10-CM   1. Symptomatic mammary hypertrophy  N62   2. Neck pain  M54.2   3. Chronic bilateral thoracic back pain  M54.6    G89.29    She has had a fair amount of drainage in her JP drains.  Due to the high volume of fluid we decided to leave the drains for 1 more week.  She can shower at this time.  Place gauze underneath the drain tube at the insertion site to prevent ulceration or skin breakdown.   We spoke about the right side being a little bit depressed at the superior aspect of her breast.  The breast was very indurated and  we expressed some fluid that instantly softened up the breasts, bilaterally.  Just after pressure was applied to the breast drainage was noted in the JP drain.  Expect this to continue draining for a few more days and it will continue to soften up.  Her incisions are healing very well.  Continue to eat healthy, drink plenty of water, take a multivitamin and vitamin C, avoid excessive carbs or sugars.  Avoid heavy lifting.  Continue to wear a breast binder or sports bra that is not too tight.  Avoid bra with wire.  We would like to see her back in 1 week for follow-up.  Most likely remove drains at that time.  Carola Rhine Dallas Torok, PA-C 05/01/2019, 10:23 AM

## 2019-05-01 ENCOUNTER — Encounter: Payer: Self-pay | Admitting: Surgical

## 2019-05-01 ENCOUNTER — Encounter: Payer: 59 | Admitting: Surgical

## 2019-05-01 ENCOUNTER — Ambulatory Visit (INDEPENDENT_AMBULATORY_CARE_PROVIDER_SITE_OTHER): Payer: 59 | Admitting: Surgical

## 2019-05-01 ENCOUNTER — Other Ambulatory Visit: Payer: Self-pay

## 2019-05-01 VITALS — BP 157/89 | HR 62 | Temp 97.8°F | Ht 70.0 in | Wt 195.2 lb

## 2019-05-01 DIAGNOSIS — G8929 Other chronic pain: Secondary | ICD-10-CM

## 2019-05-01 DIAGNOSIS — M542 Cervicalgia: Secondary | ICD-10-CM

## 2019-05-01 DIAGNOSIS — M546 Pain in thoracic spine: Secondary | ICD-10-CM

## 2019-05-01 DIAGNOSIS — N62 Hypertrophy of breast: Secondary | ICD-10-CM

## 2019-05-04 NOTE — Progress Notes (Signed)
   Subjective:     Patient ID: Tara Holland, female    DOB: Apr 20, 1961, 58 y.o.   MRN: [id number]  Chief Complaint  Patient presents with  . Follow-up    HPI: The patient is a 58 y.o. female here for follow-up after bilateral breast reduction on 04/23/19.   Tara Holland is doing very well today.  She reports that her drain output bilaterally has decreased.  She approximately gets 15-20 cc every 24 hours or so.  Her incisions are healing well with no sign of infection.  Along with the inframammary fold of the left breast she has a little bit of a rash developing.  Appears to be from excessive moisture and possibly rubbing of the Steri-Strips against her skin.  There are some darkening lesions as well as some satellite lesions.  She reports that it is itchy at times.  She has not had any fevers or chills.  Since her last visit she has been wearing a sports bra.  Review of Systems  Constitutional: Negative for chills, diaphoresis and fever.  Respiratory: Negative.   Cardiovascular: Negative.   Genitourinary: Negative.   Musculoskeletal: Negative.  Negative for back pain and neck pain.  Skin: Positive for rash.  Neurological: Negative.      Objective:   Vital Signs BP (!) 153/97 (BP Location: Right Arm, Patient Position: Sitting)   Pulse 62   Temp 98 F (36.7 C)   SpO2 100%  Vital Signs and Nursing Note Reviewed Chaperone present Physical Exam  Constitutional: She is oriented to person, place, and time and well-developed, well-nourished, and in no distress. No distress.  HENT:  Head: Normocephalic and atraumatic.  Cardiovascular: Normal rate.  Pulmonary/Chest: Effort normal.  The nipple/areola incisions are healing well as well as the vertical limb incisions.  Steri-Strips still in place.  Musculoskeletal: Normal range of motion.  Neurological: She is alert and oriented to person, place, and time. Gait normal.  Skin: Skin is warm and dry. Rash noted. She is not diaphoretic.  No erythema.  Rash present along the midline infra mammary fold.  Darkening of the skin with some satellite lesions.    Assessment/Plan:     ICD-10-CM   1. Symptomatic mammary hypertrophy  N62   2. Rash of body  R21     Tara Holland drains were removed today.  She has been doing very well since her last visit.  Continue to wear sports bra/bra without a underwire.  Avoid sleeping directly on breasts.  Continue to eat healthy, drink plenty of water, take a multivitamin, vitamin C.  Left breast rash; fluocinonide cream sent into the pharmacy.  Apply once daily.  Call with any questions or concerns or if symptoms worsen or do not improve.   Carola Rhine Zurisadai Helminiak, PA-C 05/05/2019, 5:32 PM

## 2019-05-05 ENCOUNTER — Encounter: Payer: Self-pay | Admitting: Surgical

## 2019-05-05 ENCOUNTER — Ambulatory Visit (INDEPENDENT_AMBULATORY_CARE_PROVIDER_SITE_OTHER): Payer: 59 | Admitting: Surgical

## 2019-05-05 ENCOUNTER — Other Ambulatory Visit: Payer: Self-pay

## 2019-05-05 VITALS — BP 153/97 | HR 62 | Temp 98.0°F

## 2019-05-05 DIAGNOSIS — N62 Hypertrophy of breast: Secondary | ICD-10-CM

## 2019-05-05 DIAGNOSIS — R21 Rash and other nonspecific skin eruption: Secondary | ICD-10-CM

## 2019-05-05 MED ORDER — FLUOCINONIDE-E 0.05 % EX CREA: 1.0000 "application " | TOPICAL_CREAM | Freq: Two times a day (BID) | CUTANEOUS | 0 refills | Status: DC

## 2019-05-06 MED ORDER — FLUOCINONIDE 0.05 % EX CREA: 1.0000 "application " | TOPICAL_CREAM | Freq: Two times a day (BID) | CUTANEOUS | 0 refills | Status: DC

## 2019-05-06 NOTE — Addendum Note (Signed)
Addended by: Wallace Going on: 05/06/2019 01:08 PM   Modules accepted: Orders

## 2019-05-11 ENCOUNTER — Telehealth: Payer: Self-pay | Admitting: Surgical

## 2019-05-11 NOTE — Telephone Encounter (Signed)
Received a call from Mrs. Dawes in regards to noticing some drainage on her bra where the nipple/areola is. She reports it as dark. She did not notice any openings in her skin. Dermabond still in place. No fevers, chills, nausea/vomiting.  She reports some pain in her nipples. Still has some norco, which she is going to take at night PRN. Instructed to use tylenol and ibuprofen PRN for pain. She understands to avoid > 4000mg  of tylenol in one day and that norco already has 325 mg.   Scheduled to follow up 05/19/19. She knows to call if she needs to be sooner or has questions/concerns. Return precautions provided.

## 2019-05-12 ENCOUNTER — Telehealth: Payer: Self-pay | Admitting: Surgical

## 2019-05-12 NOTE — Telephone Encounter (Signed)
Spoke with Tara Holland over the phone.  She called today with questions about the drainage that she noted on her bra where the nipple areola is.  She reports today that it looks like a small amount of bright blood and some yellow drainage.  She does not have any fevers or chills, nausea or vomiting.  She is still having pain in her nipples and is having this pain with showering.  I informed her to shower with her back to the water and allow the water to run across her chest and not directly onto it.  Dermabond is still in place.  Continue taking ibuprofen and Tylenol as needed for pain.  She is going to try and take a picture and uploaded into her my chart.  She is aware that if she develops any fevers, chills, nausea, vomiting or excessive bleeding to call us immediately and we will see her.  She is scheduled for follow-up on May 19, 2019 but I would like to see her on May 15, 2019.  If she is able to upload a photo into my chart prior to the visit on the seventh and everything appears normal then we will postpone that visit until the 11th.

## 2019-05-14 NOTE — Progress Notes (Signed)
   Subjective:     Patient ID: Tara Holland, female    DOB: 1961-06-15, 58 y.o.   MRN: [id number]  Chief Complaint  Patient presents with  . Follow-up    on (B) breast reduction    HPI: The patient is a 58 y.o. female here for follow-up after bilateral breast reduction on 04/23/19. I have spoken to her multiple times over the phone over the last few days due to her having questions in regards to drainage noted on her breast binder. She has been using Vaseline on the wound. She denied any fevers, chills, n/v, erythema or increased pain.  Today she reports that this AM she noticed some redness along the area surrounding her areola bilaterally. She just started using new bandages over the last few days, this may be contributing to this. She has also been using the antibacterial soap to shower.   She does not have any fevers, chills, nausea or vomiting. No sign of infection. Erythema appears to be associated with some raised bumps.  Incisions are c/d/i. Steristrips in place.  Previous rash under her left breast has resolved with Lidex.  Review of Systems  Constitutional: Negative for chills, diaphoresis, fever and malaise/fatigue.  HENT: Negative.   Respiratory: Negative.   Cardiovascular: Negative.   Gastrointestinal: Negative.   Genitourinary: Negative.   Musculoskeletal: Negative.  Negative for back pain and neck pain.  Skin: Positive for itching and rash.  Neurological: Negative.      Objective:   Vital Signs BP (!) 151/93 (BP Location: Right Arm, Patient Position: Sitting, Cuff Size: Large)   Pulse (!) 51   Temp 98.2 F (36.8 C) (Oral)   Ht 5\' 10"  (1.778 m)   Wt 196 lb (88.9 kg)   SpO2 99%   BMI 28.12 kg/m  Vital Signs and Nursing Note Reviewed  Physical Exam  Constitutional: She is oriented to person, place, and time and well-developed, well-nourished, and in no distress. No distress.  HENT:  Head: Normocephalic and atraumatic.  Cardiovascular: Normal rate.   Pulmonary/Chest: Effort normal.  Neurological: She is alert and oriented to person, place, and time. Gait normal.  Skin: Skin is warm and dry. Rash noted. She is not diaphoretic. There is erythema. No pallor.       Assessment/Plan:     ICD-10-CM   1. Symptomatic mammary hypertrophy  N62   2. Rash of body  R21     Rash appears to be like a folliculitis. Unsure of exact etiology, could be the new bandages she has been using to absorb the drainage she noticed. Could also be the antibacterial soap she has been using, but less likely due to rash being localized. Unlikely to be associated with Dermabond and/or steri-strips as her rash did not begin post-operatively.    Benadryl nightly, Claritin or Allegra daily, antibiotic sent to pharmacy.  Despite the rash, she appears to be healing well. Incisions are healing nicely. Dermabond and Steri-strips in place.  No swelling of breasts noted.  Follow up on Tuesday, 05/19/19.  Carola Rhine Malayjah Otoole, PA-C 05/15/2019, 2:21 PM

## 2019-05-15 ENCOUNTER — Other Ambulatory Visit: Payer: Self-pay

## 2019-05-15 ENCOUNTER — Encounter: Payer: Self-pay | Admitting: Surgical

## 2019-05-15 ENCOUNTER — Ambulatory Visit (INDEPENDENT_AMBULATORY_CARE_PROVIDER_SITE_OTHER): Payer: 59 | Admitting: Surgical

## 2019-05-15 VITALS — BP 151/93 | HR 51 | Temp 98.2°F | Ht 70.0 in | Wt 196.0 lb

## 2019-05-15 DIAGNOSIS — R21 Rash and other nonspecific skin eruption: Secondary | ICD-10-CM

## 2019-05-15 DIAGNOSIS — N62 Hypertrophy of breast: Secondary | ICD-10-CM

## 2019-05-15 MED ORDER — DOXYCYCLINE HYCLATE 100 MG PO TABS: 100.0000 mg | ORAL_TABLET | Freq: Two times a day (BID) | ORAL | 0 refills | Status: DC

## 2019-05-18 ENCOUNTER — Telehealth: Payer: Self-pay

## 2019-05-18 NOTE — Telephone Encounter (Signed)

## 2019-05-19 ENCOUNTER — Ambulatory Visit (INDEPENDENT_AMBULATORY_CARE_PROVIDER_SITE_OTHER): Payer: 59 | Admitting: Surgical

## 2019-05-19 ENCOUNTER — Encounter: Payer: Self-pay | Admitting: Surgical

## 2019-05-19 ENCOUNTER — Other Ambulatory Visit: Payer: Self-pay

## 2019-05-19 VITALS — BP 151/94 | HR 53 | Temp 98.2°F | Ht 70.0 in | Wt 196.0 lb

## 2019-05-19 DIAGNOSIS — R21 Rash and other nonspecific skin eruption: Secondary | ICD-10-CM

## 2019-05-19 DIAGNOSIS — N62 Hypertrophy of breast: Secondary | ICD-10-CM

## 2019-05-19 MED ORDER — DOXYCYCLINE HYCLATE 100 MG PO TABS: 100.0000 mg | ORAL_TABLET | Freq: Two times a day (BID) | ORAL | 0 refills | Status: AC

## 2019-05-19 NOTE — Progress Notes (Signed)
   Subjective:     Patient ID: Tara Holland, female    DOB: 20-Feb-1961, 58 y.o.   MRN: [id number]  Chief Complaint  Patient presents with  . Follow-up    2 weeks for (B) breast reduction    HPI: The patient is a 58 y.o. female here for follow-up after bilateral breast reduction on 04/23/19. She reports that overall, this is the best that she has felt.  She still has the rash/erythema surrounding her areola bilaterally and surrounding breast area, but reports it has not been as itchy. She has stopped using the antibacterial soap and has been taking doxycycline for the past few days. Denies any fevers, chills, n/v. She has been taking benadryl at night.   She has had some difficulty with BM's, but milk of magnesia has helped.  Incisions are intact. Dermabond in place. Steri-strips in place. Her shoulder and neck pain have improved since surgery.  Review of Systems  Constitutional: Negative for chills, diaphoresis, fever and malaise/fatigue.  HENT: Negative.   Respiratory: Negative.   Cardiovascular: Negative.   Gastrointestinal: Positive for constipation.  Genitourinary: Negative.   Musculoskeletal: Negative for back pain, joint pain and neck pain.  Skin: Positive for rash. Negative for itching.    Objective:   Vital Signs BP (!) 151/94 (BP Location: Right Arm, Patient Position: Sitting, Cuff Size: Large)   Pulse (!) 53   Temp 98.2 F (36.8 C) (Oral)   Ht 5\' 10"  (1.778 m)   Wt 196 lb (88.9 kg) Comment: Per patient  SpO2 99%   BMI 28.12 kg/m  Vital Signs and Nursing Note Reviewed Chaperone present Physical Exam  Constitutional: She is oriented to person, place, and time and well-developed, well-nourished, and in no distress. No distress.  HENT:  Head: Normocephalic and atraumatic.  Neck: Normal range of motion.  Cardiovascular: Normal rate.  Pulmonary/Chest: Effort normal.  Abdominal: Soft. She exhibits no distension. There is no abdominal tenderness.  Musculoskeletal:  Normal range of motion.        General: No tenderness, deformity or edema.  Neurological: She is alert and oriented to person, place, and time. Gait normal.  Skin: Skin is warm and dry. Rash noted. She is not diaphoretic. There is erythema. No pallor.  Pustular rash with erythema noted. Drainage on bra. Honey colored drainage.    Assessment/Plan:     ICD-10-CM   1. Rash of body  R21   2. Symptomatic mammary hypertrophy  N62     Appears to have some contact dermatitis, unsure of the etiology. Continue to use benadryl at night and Claritin or allegra during the day for itch relief. Try fluocinonide cream on medial aspect of left breast and call tomorrow to follow up. Additional abx sent to pharmacy.  Her incisions are healing well. Does not appear to be infected clinically, subjectively. Erythema most likely from rash. Will continue to closely monitor progress.  Follow up in 2 week or if needed sooner. Strict precautions provided.   Carola Rhine Genesis Nov[place]ad, PA-C 05/19/2019, 10:18 AM

## 2019-05-21 ENCOUNTER — Telehealth: Payer: Self-pay | Admitting: *Deleted

## 2019-05-21 NOTE — Telephone Encounter (Signed)
Called to check on the patient to see how she was doing and feeling.  Patient stated she's doing good, no pain and the itching has stopped.  She stated that she stopped all medications except her BP medicine and Benadryl.  She stated she went old school.  She used peroxide on the areas and let it dry, then she applied Vaseline around the breast, but not on the nipples.  The itching stopped.  Patient states she feels the cream my have caused the rash.  So she has stopped it all even the pads.  Patient wanted to know when she's to come back for a follow-up?  Informed the patient that I will let Matthew,PA know she's doing good, see when he wants her to come back for a follow-up.  Patient verbalized understanding and agreed.//AB/CMA

## 2019-05-22 NOTE — Telephone Encounter (Signed)
Called and spoke with the patient and informed her that per [facility] she can follow-up in 2-3 weeks.  Patient verbalized understanding and agreed.  She stated that she is doing good and no itching.  She said she feels the rash is coming from the glue.  Informed her that I will let Matthew,PA know.//AB/CMA

## 2019-05-25 ENCOUNTER — Telehealth: Payer: Self-pay

## 2019-05-25 NOTE — Telephone Encounter (Signed)
Received call from Ms. Tara Holland requesting steroid cream for increased itching. I informed her that she has already been prescribed a steroid cream that should be applied twice daily per [name], [place]. The patient expressed understanding and will call if any issues arise.

## 2019-05-29 ENCOUNTER — Emergency Department (HOSPITAL_COMMUNITY)
Admission: EM | Admit: 2019-05-29 | Discharge: 2019-05-30 | Disposition: A | Discharge status: Home / Self Care | Payer: 59 | Attending: Emergency Medicine | Admitting: Emergency Medicine

## 2019-05-29 ENCOUNTER — Encounter (HOSPITAL_COMMUNITY): Payer: Self-pay | Admitting: Emergency Medicine

## 2019-05-29 ENCOUNTER — Other Ambulatory Visit: Payer: Self-pay

## 2019-05-29 DIAGNOSIS — Z9889 Other specified postprocedural states: Secondary | ICD-10-CM | POA: Insufficient documentation

## 2019-05-29 DIAGNOSIS — I10 Essential (primary) hypertension: Secondary | ICD-10-CM | POA: Diagnosis not present

## 2019-05-29 DIAGNOSIS — L309 Dermatitis, unspecified: Secondary | ICD-10-CM

## 2019-05-29 DIAGNOSIS — L989 Disorder of the skin and subcutaneous tissue, unspecified: Secondary | ICD-10-CM | POA: Diagnosis not present

## 2019-05-29 DIAGNOSIS — E119 Type 2 diabetes mellitus without complications: Secondary | ICD-10-CM | POA: Diagnosis not present

## 2019-05-29 DIAGNOSIS — Z79899 Other long term (current) drug therapy: Secondary | ICD-10-CM | POA: Insufficient documentation

## 2019-05-29 LAB — CBC WITH DIFFERENTIAL/PLATELET
Abs Immature Granulocytes: 0.02 10*3/uL (ref 0.00–0.07)
Basophils Absolute: 0.1 10*3/uL (ref 0.0–0.1)
Basophils Relative: 1 %
Eosinophils Absolute: 0.4 10*3/uL (ref 0.0–0.5)
Eosinophils Relative: 5 %
HCT: 42.2 % (ref 36.0–46.0)
Hemoglobin: 12.9 g/dL (ref 12.0–15.0)
Immature Granulocytes: 0 %
Lymphocytes Relative: 33 %
Lymphs Abs: 2.8 10*3/uL (ref 0.7–4.0)
MCH: 28.9 pg (ref 26.0–34.0)
MCHC: 30.6 g/dL (ref 30.0–36.0)
MCV: 94.6 fL (ref 80.0–100.0)
Monocytes Absolute: 0.5 10*3/uL (ref 0.1–1.0)
Monocytes Relative: 6 %
Neutro Abs: 4.7 10*3/uL (ref 1.7–7.7)
Neutrophils Relative %: 55 %
Platelets: 245 10*3/uL (ref 150–400)
RBC: 4.46 MIL/uL (ref 3.87–5.11)
RDW: 13.6 % (ref 11.5–15.5)
WBC: 8.5 10*3/uL (ref 4.0–10.5)
nRBC: 0 % (ref 0.0–0.2)

## 2019-05-29 LAB — COMPREHENSIVE METABOLIC PANEL
ALT: 17 U/L (ref 0–44)
AST: 21 U/L (ref 15–41)
Albumin: 3.8 g/dL (ref 3.5–5.0)
Alkaline Phosphatase: 68 U/L (ref 38–126)
Anion gap: 9 (ref 5–15)
BUN: 17 mg/dL (ref 6–20)
CO2: 23 mmol/L (ref 22–32)
Calcium: 9.7 mg/dL (ref 8.9–10.3)
Chloride: 109 mmol/L (ref 98–111)
Creatinine, Ser: 1.5 mg/dL — ABNORMAL HIGH (ref 0.44–1.00)
GFR calc Af Amer: 43 mL/min — ABNORMAL LOW (ref >60)
GFR calc non Af Amer: 37 mL/min — ABNORMAL LOW (ref >60)
Glucose, Bld: 115 mg/dL — ABNORMAL HIGH (ref 70–99)
Potassium: 3.6 mmol/L (ref 3.5–5.1)
Sodium: 141 mmol/L (ref 135–145)
Total Bilirubin: 0.6 mg/dL (ref 0.3–1.2)
Total Protein: 6.8 g/dL (ref 6.5–8.1)

## 2019-05-29 LAB — LACTIC ACID, PLASMA: Lactic Acid, Venous: 0.7 mmol/L (ref 0.5–1.9)

## 2019-05-29 MED ORDER — SODIUM CHLORIDE 0.9% FLUSH: 3.0000 mL | Freq: Once | INTRAVENOUS | Status: DC

## 2019-05-29 NOTE — ED Triage Notes (Signed)
Pt here for eval of redness with swelling and pain to bilateral breasts, post breast reduction from July. States she has tried benadryl, steroid cream and topical stuff for tx of "skin reaction to derma bond" but reports she has had some chills.

## 2019-05-30 MED ORDER — PREDNISONE 10 MG PO TABS: 20.0000 mg | ORAL_TABLET | Freq: Two times a day (BID) | ORAL | 0 refills | Status: DC

## 2019-05-30 NOTE — ED Notes (Signed)
Steri strips removed from breast by Dr. Stark Jock

## 2019-05-30 NOTE — ED Notes (Signed)
Pt c/o itching on bil breast from a recent breast reduction.

## 2019-05-30 NOTE — ED Provider Notes (Signed)
[facility] Provider Note   CSN: [id number] Arrival date & time: 05/29/19  1718     History   Chief Complaint Chief Complaint  Patient presents with  . Post-op Problem    HPI Tara Holland is a 58 y.o. female.     Patient is a 58 year old female with past medical history of diabetes, hypertension, and recent breast reduction surgery performed by Dr. Elisabeth Holland.  This was approximately 5 weeks ago.  She presents today with ongoing irritation and discomfort to the skin in the area of the surgery.  Patient has been treated with antibiotics and a steroid cream in the past, however this does not seem to be helping.  She now has rash extending into her upper abdomen.   The history is provided by the patient.    Past Medical History:  Diagnosis Date  . Hypertension   . Pain in joint, hand   . Pre-diabetes   . Pure hypercholesterolemia   . Symptomatic mammary hypertrophy   . Vaginal irritation     Patient Active Problem List   Diagnosis Date Noted  . Symptomatic mammary hypertrophy 02/12/2019  . Neck pain 02/12/2019  . DM (diabetes mellitus), type 2 ([place]) 11/28/2017  . Facial droop 11/27/2017  . Malignant hypertension 11/27/2017  . Hyperglycemia 11/27/2017  . Abdominal pain 05/24/2016  . Spasm of vocal cords 11/11/2015  . Back pain 04/21/2009  . VENTRICULAR HYPERTROPHY, LEFT 02/17/2009  . OBESITY 02/02/2009  . Essential hypertension 01/04/2009  . HYPERCHOLESTEROLEMIA 02/06/2008    Past Surgical History:  Procedure Laterality Date  . BREAST LUMPECTOMY    . BREAST REDUCTION SURGERY Bilateral 04/23/2019   Procedure: MAMMARY REDUCTION  (BREAST);  Surgeon: Tara Going, DO;  Location: [facility];  Service: Plastics;  Laterality: Bilateral;  . VAGINAL HYSTERECTOMY       OB History   No obstetric history on file.      Home Medications    Prior to Admission medications   Medication Sig Start Date End  Date Taking? Authorizing Provider  amLODipine-benazepril (LOTREL) 5-20 MG capsule Take by mouth. 04/06/19   [provider]  fluocinonide cream (LIDEX) [id number] % Apply 1 application topically 2 (two) times daily. 05/06/19   Holland, Tara Lofty, DO  fluocinonide-emollient (LIDEX-E) 0.05 % cream Apply 1 application topically 2 (two) times daily. 05/05/19   Holland, Tara Rhine, PA-C  Multiple Vitamin (MULTI-VITAMIN DAILY PO) Multi Vitamin  daily    [provider]    Family History Family History  Problem Relation Age of Onset  . Hypertension Mother   . Kidney disease Mother   . CVA Mother 107  . Kidney disease Brother     Social History Social History   Tobacco Use  . Smoking status: Never Smoker  . Smokeless tobacco: Never Used  Substance Use Topics  . Alcohol use: No  . Drug use: No     Allergies   Sulfonamide derivatives   Review of Systems Review of Systems  All other systems reviewed and are negative.    Physical Exam Updated Vital Signs BP (!) 185/94 (BP Location: Right Arm)   Pulse 61   Temp 98.4 F (36.9 C) (Oral)   Resp 18   SpO2 97%   Physical Exam Vitals signs and nursing note reviewed.  Constitutional:      General: She is not in acute distress.    Appearance: Normal appearance. She is not ill-appearing.  HENT:  Head: Normocephalic and atraumatic.  Pulmonary:     Effort: Pulmonary effort is normal.  Skin:    General: Skin is warm and dry.     Comments: Patient is status post breast reduction surgery.  She has multiple Steri-Strips in place to the underside of her breasts.  There is some swelling and irritation of the tissues in this area.  There is no drainage, erythema, or wound dehiscence.  Neurological:     Mental Status: She is alert.      ED Treatments / Results  Labs (all labs ordered are listed, but only abnormal results are displayed) Labs Reviewed  COMPREHENSIVE METABOLIC PANEL - Abnormal; Notable for the  following components:      Result Value   Glucose, Bld 115 (*)    Creatinine, Ser 1.50 (*)    GFR calc non Af Amer 37 (*)    GFR calc Af Amer 43 (*)    All other components within normal limits  LACTIC ACID, PLASMA  CBC WITH DIFFERENTIAL/PLATELET  LACTIC ACID, PLASMA    EKG None  Radiology No results found.  Procedures Procedures (including critical care time)  Medications Ordered in ED Medications  sodium chloride flush (NS) 0.9 % injection 3 mL (has no administration in time range)     Initial Impression / Assessment and Plan / ED Course  I have reviewed the triage vital signs and the nursing notes.  Pertinent labs & imaging results that were available during my care of the patient were reviewed by me and considered in my medical decision making (see chart for details).  Patient with irritation to the tissues to the underside of her breasts.  She is 5 weeks status post breast reduction surgery by Dr. Marla Holland.  There are Steri-Strips in place that have been there for the past 5 weeks.  It appears to me as though there may be some local irritation from these strips remaining in place.  They have been removed.  Patient will be advised to clean this area well and we will see how this goes.  She will also be given prednisone in case this is some sort of allergic reaction to the adhesive in the Steri-Strips.  Final Clinical Impressions(s) / ED Diagnoses   Final diagnoses:  None    ED Discharge Orders    None       Veryl Speak, MD 05/30/19 [id number]

## 2019-05-30 NOTE — Discharge Instructions (Addendum)
Prednisone as prescribed.  Follow-up with Dr. Marla Roe next week, and return to the ER if symptoms significantly worsen or change in the meantime.

## 2019-06-02 NOTE — Telephone Encounter (Signed)
Spoke with Ms. Dec yesterday in regards to her rash. She reports it had been improving and then she noticed her breast was warm to the touch and took her temperature, which was 97.7 but her temperature in the breast was 101.4.  The rash was b/l under both breasts on the stomach and she was anxious so she went to the ED. Steri-strips removed in the ED. Patient prescribed by ED provider 5 days of steroids per patient.  She is doing fine now, no fevers, chills. She has been anxious throughout this process due to it being her first surgical intervention. Overall, she is doing well.  She knows to call with questions or concerns.

## 2019-06-29 ENCOUNTER — Telehealth: Payer: Self-pay

## 2019-06-29 NOTE — Progress Notes (Signed)
   Subjective:     Patient ID: Tara [facility], female    DOB: [birth year], 58 y.o.   MRN: [id number]  Chief Complaint  Patient presents with  . Follow-up    1 mos for (B) breast reduction    HPI: The patient is a 58 y.o. female here for follow-up after bilateral breast reduction on 04/23/2019 with Dr. Marla Roe. She is 2 months post-op.  She had some rashes develop post-operatively, these have resolved but have left some hyperpigmentation around the NAC and the inframammary fold where her breast touched the abdomen. Her incisions are c/d/i and healing nicely. No sign of infection, seroma, hematoma.  She has a small area on the right breast where there is a divot and is having some numbness in that area. She has some dry skin on bilateral breasts.  No new rashes, fever, chills,.  Patient reports that she thought her breasts were going to be more perky postoperatively.  Review of Systems  Constitutional: Positive for weight loss. Negative for chills, diaphoresis, fever and malaise/fatigue.  Respiratory: Negative.   Cardiovascular: Negative.   Genitourinary: Negative.   Musculoskeletal: Positive for joint pain (shoulder).  Skin: Negative for itching and rash.  Neurological: Positive for sensory change.     Objective:   Vital Signs Pulse (!) 55   Temp (!) 97.5 F (36.4 C) (Temporal)   Ht 5\' 10"  (1.778 m)   Wt 194 lb 3.2 oz (88.1 kg)   SpO2 98%   BMI 27.86 kg/m  Vital Signs and Nursing Note Reviewed Chaperone present  Physical Exam  Constitutional: She is oriented to person, place, and time and well-developed, well-nourished, and in no distress.  HENT:  Head: Normocephalic and atraumatic.  Cardiovascular: Normal rate.  Pulmonary/Chest: Effort normal.    Musculoskeletal: Normal range of motion.  Neurological: She is alert and oriented to person, place, and time. Gait normal.  Skin: Skin is warm and dry. No rash noted. She is not diaphoretic. No erythema. No pallor.   Psychiatric: Mood and affect normal.     Assessment/Plan:     ICD-10-CM   1. Symptomatic mammary hypertrophy  N62   2. Neck pain  M54.2   3. Chronic bilateral thoracic back pain  M54.6    G89.29     Numbness on right breast should improve over time.  We had a thorough conversation about breast reduction vs mastopexy in regards to breast ptosis and she understood.  Cocoa butter or mild lotion without a scent for the dry skin. Begin using Mederma for incisions in 2 weeks. Would like to hold off to prevent any new skin reactions. Patch test explained.  Follow up in 6 months for evaluation.  Carola Rhine Jyrah Blye, PA-C 06/30/2019, 12:56 PM

## 2019-06-29 NOTE — Telephone Encounter (Signed)

## 2019-06-30 ENCOUNTER — Other Ambulatory Visit: Payer: Self-pay

## 2019-06-30 ENCOUNTER — Encounter: Payer: Self-pay | Admitting: Surgical

## 2019-06-30 ENCOUNTER — Ambulatory Visit (INDEPENDENT_AMBULATORY_CARE_PROVIDER_SITE_OTHER): Payer: 59 | Admitting: Surgical

## 2019-06-30 VITALS — HR 55 | Temp 97.5°F | Ht 70.0 in | Wt 194.2 lb

## 2019-06-30 DIAGNOSIS — M546 Pain in thoracic spine: Secondary | ICD-10-CM

## 2019-06-30 DIAGNOSIS — M542 Cervicalgia: Secondary | ICD-10-CM

## 2019-06-30 DIAGNOSIS — G8929 Other chronic pain: Secondary | ICD-10-CM

## 2019-06-30 DIAGNOSIS — N62 Hypertrophy of breast: Secondary | ICD-10-CM

## 2019-07-09 ENCOUNTER — Telehealth: Payer: Self-pay

## 2019-07-09 NOTE — Telephone Encounter (Signed)
Patient called asking when she would be able to get a mammogram preformed. She normally gets her yearly mammograms performed in November. Please call and advise.   Okay to leave as voicemail as patient works from 2pm-9pm.

## 2019-07-09 NOTE — Telephone Encounter (Signed)
Called patient and [facility] regarding the message below.  Informed the patient per [facility] can get mammogram in 6 mos.  Asked the patient to give [place] a call back if she has any questions.//AB/CMA

## 2019-07-10 ENCOUNTER — Telehealth: Payer: Self-pay | Admitting: *Deleted

## 2019-07-10 NOTE — Telephone Encounter (Signed)
Called and spoke with the patient and made her  aware of the message below.//AB/CMA

## 2019-07-10 NOTE — Telephone Encounter (Addendum)
-----   Message from Charlies Constable, PA-C sent at 07/09/2019  4:27 PM EDT ----- Regarding: Job Founds can have her Mammogram in January. 6 months after surgical date (04/23/19)  Thanks.

## 2019-10-12 ENCOUNTER — Ambulatory Visit: Payer: PRIVATE HEALTH INSURANCE | Attending: Internal Medicine

## 2019-10-12 DIAGNOSIS — Z20822 Contact with and (suspected) exposure to covid-19: Secondary | ICD-10-CM | POA: Insufficient documentation

## 2019-10-13 LAB — NOVEL CORONAVIRUS, NAA: SARS-CoV-2, NAA: NOT DETECTED

## 2019-10-26 ENCOUNTER — Encounter: Payer: Self-pay | Admitting: Surgical

## 2019-10-30 ENCOUNTER — Ambulatory Visit: Payer: 59 | Admitting: Surgical

## 2019-11-11 ENCOUNTER — Other Ambulatory Visit: Payer: Self-pay | Admitting: Radiology

## 2019-11-12 ENCOUNTER — Other Ambulatory Visit: Payer: Self-pay | Admitting: Radiology

## 2019-11-12 DIAGNOSIS — N6452 Nipple discharge: Secondary | ICD-10-CM

## 2019-11-12 DIAGNOSIS — R922 Inconclusive mammogram: Secondary | ICD-10-CM

## 2019-11-12 DIAGNOSIS — Z803 Family history of malignant neoplasm of breast: Secondary | ICD-10-CM

## 2019-11-24 ENCOUNTER — Other Ambulatory Visit: Payer: Self-pay

## 2019-11-24 ENCOUNTER — Ambulatory Visit
Admission: RE | Admit: 2019-11-24 | Discharge: 2019-11-24 | Disposition: A | Discharge status: Home / Self Care | Payer: PRIVATE HEALTH INSURANCE | Source: Ambulatory Visit | Attending: Radiology | Admitting: Radiology

## 2019-11-24 DIAGNOSIS — N6452 Nipple discharge: Secondary | ICD-10-CM

## 2019-11-24 DIAGNOSIS — R922 Inconclusive mammogram: Secondary | ICD-10-CM

## 2019-11-24 DIAGNOSIS — Z803 Family history of malignant neoplasm of breast: Secondary | ICD-10-CM

## 2019-11-24 MED ORDER — GADOBUTROL 1 MMOL/ML IV SOLN
9.0000 mL | Freq: Once | INTRAVENOUS | Status: AC | PRN
  Administered 2019-11-24: 9 mL via INTRAVENOUS

## 2019-12-02 ENCOUNTER — Other Ambulatory Visit: Payer: Self-pay | Admitting: Radiology

## 2019-12-02 DIAGNOSIS — R9389 Abnormal findings on diagnostic imaging of other specified body structures: Secondary | ICD-10-CM

## 2019-12-10 ENCOUNTER — Ambulatory Visit
Admission: RE | Admit: 2019-12-10 | Discharge: 2019-12-10 | Disposition: A | Discharge status: Home / Self Care | Payer: PRIVATE HEALTH INSURANCE | Source: Ambulatory Visit | Attending: Radiology | Admitting: Radiology

## 2019-12-10 ENCOUNTER — Other Ambulatory Visit (HOSPITAL_COMMUNITY): Payer: Self-pay | Admitting: Diagnostic Radiology

## 2019-12-10 ENCOUNTER — Other Ambulatory Visit: Payer: Self-pay

## 2019-12-10 DIAGNOSIS — R9389 Abnormal findings on diagnostic imaging of other specified body structures: Secondary | ICD-10-CM

## 2019-12-10 MED ORDER — GADOBUTROL 1 MMOL/ML IV SOLN
9.0000 mL | Freq: Once | INTRAVENOUS | Status: AC | PRN
  Administered 2019-12-10: 09:00:00 9 mL via INTRAVENOUS

## 2019-12-21 DIAGNOSIS — D242 Benign neoplasm of left breast: Secondary | ICD-10-CM | POA: Insufficient documentation

## 2020-01-14 ENCOUNTER — Encounter: Payer: Self-pay | Admitting: Plastic Surgery

## 2020-01-15 ENCOUNTER — Encounter: Payer: Self-pay | Admitting: Plastic Surgery

## 2020-04-09 ENCOUNTER — Other Ambulatory Visit: Payer: Self-pay | Admitting: Plastic Surgery

## 2020-08-24 ENCOUNTER — Other Ambulatory Visit: Payer: Self-pay

## 2020-08-24 ENCOUNTER — Emergency Department (HOSPITAL_BASED_OUTPATIENT_CLINIC_OR_DEPARTMENT_OTHER): Payer: 59

## 2020-08-24 ENCOUNTER — Encounter (HOSPITAL_BASED_OUTPATIENT_CLINIC_OR_DEPARTMENT_OTHER): Payer: Self-pay | Admitting: *Deleted

## 2020-08-24 ENCOUNTER — Emergency Department (HOSPITAL_BASED_OUTPATIENT_CLINIC_OR_DEPARTMENT_OTHER)
Admission: EM | Admit: 2020-08-24 | Discharge: 2020-08-24 | Disposition: A | Discharge status: Home / Self Care | Payer: 59 | Attending: Emergency Medicine | Admitting: Emergency Medicine

## 2020-08-24 DIAGNOSIS — R059 Cough, unspecified: Secondary | ICD-10-CM

## 2020-08-24 DIAGNOSIS — I10 Essential (primary) hypertension: Secondary | ICD-10-CM | POA: Insufficient documentation

## 2020-08-24 DIAGNOSIS — Z79899 Other long term (current) drug therapy: Secondary | ICD-10-CM | POA: Diagnosis not present

## 2020-08-24 DIAGNOSIS — Z20822 Contact with and (suspected) exposure to covid-19: Secondary | ICD-10-CM | POA: Insufficient documentation

## 2020-08-24 DIAGNOSIS — E7800 Pure hypercholesterolemia, unspecified: Secondary | ICD-10-CM | POA: Insufficient documentation

## 2020-08-24 DIAGNOSIS — E1169 Type 2 diabetes mellitus with other specified complication: Secondary | ICD-10-CM | POA: Insufficient documentation

## 2020-08-24 DIAGNOSIS — J069 Acute upper respiratory infection, unspecified: Secondary | ICD-10-CM | POA: Insufficient documentation

## 2020-08-24 LAB — RESPIRATORY PANEL BY RT PCR (FLU A&B, COVID)
Influenza A by PCR: NEGATIVE
Influenza B by PCR: NEGATIVE
SARS Coronavirus 2 by RT PCR: NEGATIVE

## 2020-08-24 NOTE — ED Notes (Signed)
ED Provider at bedside. 

## 2020-08-24 NOTE — ED Triage Notes (Signed)
C/o cough x 3 days needs note for work

## 2020-08-24 NOTE — Discharge Instructions (Addendum)
Take Tylenol Motrin as needed for pain or fevers.  If you develop difficulty breathing, chest pain or other new concerning symptom, return to ER for reassessment.  If the Covid test is positive he will need to follow isolation precautions.

## 2020-08-24 NOTE — ED Provider Notes (Signed)
[facility] EMERGENCY DEPARTMENT Provider Note   CSN: [id number] Arrival date & time: 08/24/20  1821     History Chief Complaint  Patient presents with  . Cough    [facility] is a 59 y.o. female.  Presents to ER with concern for cough.  Patient reports for the last 3 days she has been having cough, mildly productive, nonbloody.  No fever.  No difficulty breathing.  Reports colleague with similar symptoms, unsure of any Covid exposures.  Reports she is fairly healthy, denies medical problems.  Per chart review, hypertension, hyperlipidemia, diabetes. Non smoker.   HPI     Past Medical History:  Diagnosis Date  . Hypertension   . Pain in joint, hand   . Pre-diabetes   . Pure hypercholesterolemia   . Symptomatic mammary hypertrophy   . Vaginal irritation     Patient Active Problem List   Diagnosis Date Noted  . Symptomatic mammary hypertrophy 02/12/2019  . Neck pain 02/12/2019  . DM (diabetes mellitus), type 2 ([place]) 11/28/2017  . Facial droop 11/27/2017  . Malignant hypertension 11/27/2017  . Hyperglycemia 11/27/2017  . Abdominal pain 05/24/2016  . Spasm of vocal cords 11/11/2015  . Back pain 04/21/2009  . VENTRICULAR HYPERTROPHY, LEFT 02/17/2009  . OBESITY 02/02/2009  . Essential hypertension 01/04/2009  . HYPERCHOLESTEROLEMIA 02/06/2008    Past Surgical History:  Procedure Laterality Date  . BREAST LUMPECTOMY    . BREAST REDUCTION SURGERY Bilateral 04/23/2019   Procedure: MAMMARY REDUCTION  (BREAST);  Surgeon: Wallace Going, DO;  Location: [facility];  Service: Plastics;  Laterality: Bilateral;  . VAGINAL HYSTERECTOMY       OB History   No obstetric history on file.     Family History  Problem Relation Age of Onset  . Hypertension Mother   . Kidney disease Mother   . CVA Mother 34  . Kidney disease Brother     Social History   Tobacco Use  . Smoking status: Never Smoker  . Smokeless tobacco: Never Used    Vaping Use  . Vaping Use: Never used  Substance Use Topics  . Alcohol use: No  . Drug use: No    Home Medications Prior to Admission medications   Medication Sig Start Date End Date Taking? Authorizing Provider  amLODipine-benazepril (LOTREL) 5-20 MG capsule Take by mouth. 04/06/19   [provider]  Multiple Vitamin (MULTI-VITAMIN DAILY PO) Multi Vitamin  daily    [provider]    Allergies    Sulfonamide derivatives  Review of Systems   Review of Systems  Constitutional: Negative for chills and fever.  HENT: Negative for ear pain and sore throat.   Eyes: Negative for pain and visual disturbance.  Respiratory: Positive for cough. Negative for shortness of breath.   Cardiovascular: Negative for chest pain and palpitations.  Gastrointestinal: Negative for abdominal pain and vomiting.  Genitourinary: Negative for dysuria and hematuria.  Musculoskeletal: Negative for arthralgias and back pain.  Skin: Negative for color change and rash.  Neurological: Negative for seizures and syncope.  All other systems reviewed and are negative.   Physical Exam Updated Vital Signs BP (!) 160/90 (BP Location: Right Arm)   Pulse 62   Temp 98.5 F (36.9 C) (Oral)   Resp 18   Ht 5\' 10"  (1.778 m)   Wt 79.4 kg   SpO2 100%   BMI 25.11 kg/m   Physical Exam Vitals and nursing note reviewed.  Constitutional:  General: She is not in acute distress.    Appearance: She is well-developed.  HENT:     Head: Normocephalic and atraumatic.  Eyes:     Conjunctiva/sclera: Conjunctivae normal.  Cardiovascular:     Rate and Rhythm: Normal rate and regular rhythm.     Heart sounds: No murmur heard.   Pulmonary:     Effort: Pulmonary effort is normal. No respiratory distress.     Breath sounds: Normal breath sounds.  Abdominal:     Palpations: Abdomen is soft.     Tenderness: There is no abdominal tenderness.  Musculoskeletal:     Cervical back: Neck supple.  Skin:     General: Skin is warm and dry.  Neurological:     Mental Status: She is alert.  Psychiatric:        Mood and Affect: Mood normal.        Behavior: Behavior normal.     ED Results / Procedures / Treatments   Labs (all labs ordered are listed, but only abnormal results are displayed) Labs Reviewed  RESPIRATORY PANEL BY RT PCR (FLU A&B, COVID)    EKG None  Radiology DG Chest 2 View  Result Date: 08/24/2020 CLINICAL DATA:  59 year old female with cough for 3 days. EXAM: CHEST - 2 VIEW COMPARISON:  Chest radiographs 01/08/2017. FINDINGS: Lung volumes and mediastinal contours are within normal limits. Visualized tracheal air column is within normal limits. No pneumothorax, pulmonary edema or pleural effusion. No convincing confluent or acute pulmonary opacity. No acute osseous abnormality identified. Negative visible bowel gas pattern. IMPRESSION: No acute cardiopulmonary abnormality. Electronically Signed   By: Genevie Ann M.D.   On: 08/24/2020 19:16    Procedures Procedures (including critical care time)  Medications Ordered in ED Medications - No data to display  ED Course  I have reviewed the triage vital signs and the nursing notes.  Pertinent labs & imaging results that were available during my care of the patient were reviewed by me and considered in my medical decision making (see chart for details).    MDM Rules/Calculators/A&P                         59 year old lady presents to ER with concern for cough.  No other symptoms, vital stable, lungs clear, CXR negative per my review and per radiology.  Suspect viral process.  Will send for COVID-19 testing.   After the discussed management above, the patient was determined to be safe for discharge.  The patient was in agreement with this plan and all questions regarding their care were answered.  ED return precautions were discussed and the patient will return to the ED with any significant worsening of condition.  [facility] was evaluated in Emergency Department on 08/24/2020 for the symptoms described in the history of present illness. She was evaluated in the context of the global COVID-19 pandemic, which necessitated consideration that the patient might be at risk for infection with the SARS-CoV-2 virus that causes COVID-19. Institutional protocols and algorithms that pertain to the evaluation of patients at risk for COVID-19 are in a state of rapid change based on information released by regulatory bodies including the CDC and federal and state organizations. These policies and algorithms were followed during the patient's care in the ED.    Final Clinical Impression(s) / ED Diagnoses Final diagnoses:  Cough  Viral URI with cough  Suspected COVID-19 virus infection    Rx /  DC Orders ED Discharge Orders    None       Lucrezia Starch, MD 08/24/20 1931

## 2020-09-12 ENCOUNTER — Encounter (HOSPITAL_BASED_OUTPATIENT_CLINIC_OR_DEPARTMENT_OTHER): Payer: Self-pay | Admitting: Emergency Medicine

## 2020-09-12 ENCOUNTER — Other Ambulatory Visit: Payer: Self-pay

## 2020-09-12 ENCOUNTER — Emergency Department (HOSPITAL_BASED_OUTPATIENT_CLINIC_OR_DEPARTMENT_OTHER): Payer: 59

## 2020-09-12 ENCOUNTER — Emergency Department (HOSPITAL_BASED_OUTPATIENT_CLINIC_OR_DEPARTMENT_OTHER)
Admission: EM | Admit: 2020-09-12 | Discharge: 2020-09-12 | Disposition: A | Discharge status: Home / Self Care | Payer: 59 | Attending: Emergency Medicine | Admitting: Emergency Medicine

## 2020-09-12 DIAGNOSIS — I10 Essential (primary) hypertension: Secondary | ICD-10-CM | POA: Insufficient documentation

## 2020-09-12 DIAGNOSIS — Z79899 Other long term (current) drug therapy: Secondary | ICD-10-CM | POA: Diagnosis not present

## 2020-09-12 DIAGNOSIS — M25561 Pain in right knee: Secondary | ICD-10-CM | POA: Diagnosis present

## 2020-09-12 DIAGNOSIS — E1165 Type 2 diabetes mellitus with hyperglycemia: Secondary | ICD-10-CM | POA: Diagnosis not present

## 2020-09-12 NOTE — ED Provider Notes (Signed)
[facility] EMERGENCY DEPARTMENT Provider Note   CSN: [id number] Arrival date & time: 09/12/20  1218     History Chief Complaint  Patient presents with  . Knee Pain    Tara Holland is a 59 y.o. female with PMHx HTN who presents to the ED today with complaint of gradual onset, constant, worsening, R knee pain x 1 week. Pt denies any trauma to the knee. Reports she woke up with the pain and it has progressively gotten worse. She does have to climb stairs quite frequently and has worsening pain with this. She states she has been unable to put any weight on it today prompting her to come to the ED. She does have an appointment with an orthopedist on Thursday. Pt has not been taking anything for the pain. She has tried an ace wrap however reports worsening pain with it so she stopped. Denies fevers, chills, redness to the joint, swelling, weakness/numbness/tingling, or any other associated symptoms.   The history is provided by the patient and medical records.       Past Medical History:  Diagnosis Date  . Hypertension   . Pain in joint, hand   . Pre-diabetes   . Pure hypercholesterolemia   . Symptomatic mammary hypertrophy   . Vaginal irritation     Patient Active Problem List   Diagnosis Date Noted  . Symptomatic mammary hypertrophy 02/12/2019  . Neck pain 02/12/2019  . DM (diabetes mellitus), type 2 ([place]) 11/28/2017  . Facial droop 11/27/2017  . Malignant hypertension 11/27/2017  . Hyperglycemia 11/27/2017  . Abdominal pain 05/24/2016  . Spasm of vocal cords 11/11/2015  . Back pain 04/21/2009  . VENTRICULAR HYPERTROPHY, LEFT 02/17/2009  . OBESITY 02/02/2009  . Essential hypertension 01/04/2009  . HYPERCHOLESTEROLEMIA 02/06/2008    Past Surgical History:  Procedure Laterality Date  . BREAST LUMPECTOMY    . BREAST REDUCTION SURGERY Bilateral 04/23/2019   Procedure: MAMMARY REDUCTION  (BREAST);  Surgeon: Wallace Going, DO;  Location: [place];  Service: Plastics;  Laterality: Bilateral;  . VAGINAL HYSTERECTOMY       OB History   No obstetric history on file.     Family History  Problem Relation Age of Onset  . Hypertension Mother   . Kidney disease Mother   . CVA Mother 26  . Kidney disease Brother     Social History   Tobacco Use  . Smoking status: Never Smoker  . Smokeless tobacco: Never Used  Vaping Use  . Vaping Use: Never used  Substance Use Topics  . Alcohol use: No  . Drug use: No    Home Medications Prior to Admission medications   Medication Sig Start Date End Date Taking? Authorizing Provider  amLODipine-benazepril (LOTREL) 5-20 MG capsule Take by mouth. 04/06/19   [provider]  Multiple Vitamin (MULTI-VITAMIN DAILY PO) Multi Vitamin  daily    [provider]    Allergies    Sulfonamide derivatives  Review of Systems   Review of Systems  Constitutional: Negative for chills and fever.  Musculoskeletal: Positive for arthralgias. Negative for joint swelling.  Skin: Negative for color change.  Neurological: Negative for weakness and numbness.    Physical Exam Updated Vital Signs BP (!) 150/100   Pulse (!) 58   Temp 98.1 F (36.7 C) (Oral)   Resp 16   Ht 5\' 10"  (1.778 m)   Wt 79.4 kg   SpO2 99%   BMI 25.11 kg/m  Physical Exam Vitals and nursing note reviewed.  Constitutional:      Appearance: She is not ill-appearing or diaphoretic.  HENT:     Head: Normocephalic and atraumatic.  Eyes:     Conjunctiva/sclera: Conjunctivae normal.  Cardiovascular:     Rate and Rhythm: Normal rate and regular rhythm.     Pulses: Normal pulses.  Pulmonary:     Effort: Pulmonary effort is normal.     Breath sounds: Normal breath sounds. No wheezing, rhonchi or rales.  Musculoskeletal:     Comments: No erythema or edema noted to the R knee compared to the L. No increased warmth to the touch. + TTP the lateral inferior aspect of the patella. ROM limited s/2 pain  however pt able to fully flex and extend. Negative anterior and posterior drawer test. No varus or valgus laxity. 2+ DP pulse.   Skin:    General: Skin is warm and dry.     Coloration: Skin is not jaundiced.  Neurological:     Mental Status: She is alert.     ED Results / Procedures / Treatments   Labs (all labs ordered are listed, but only abnormal results are displayed) Labs Reviewed - No data to display  EKG None  Radiology DG Knee Complete 4 Views Right  Result Date: 09/12/2020 CLINICAL DATA:  Acute right knee pain without known injury. EXAM: RIGHT KNEE - COMPLETE 4+ VIEW COMPARISON:  None. FINDINGS: No evidence of fracture, dislocation, or joint effusion. No evidence of arthropathy or other focal bone abnormality. Soft tissues are unremarkable. IMPRESSION: Negative. Electronically Signed   By: Marijo Conception M.D.   On: 09/12/2020 13:21   DG Hip Unilat W or Wo Pelvis 2-3 Views Right  Result Date: 09/12/2020 CLINICAL DATA:  Acute right hip pain without known injury. EXAM: DG HIP (WITH OR WITHOUT PELVIS) 2-3V RIGHT COMPARISON:  None. FINDINGS: There is no evidence of hip fracture or dislocation. There is no evidence of arthropathy or other focal bone abnormality. IMPRESSION: Negative. Electronically Signed   By: Marijo Conception M.D.   On: 09/12/2020 13:21    Procedures Procedures (including critical care time)  Medications Ordered in ED Medications - No data to display  ED Course  I have reviewed the triage vital signs and the nursing notes.  Pertinent labs & imaging results that were available during my care of the patient were reviewed by me and considered in my medical decision making (see chart for details).    MDM Rules/Calculators/A&P                          60 year old female presents to the ED today complaining of atraumatic right knee pain for the past week, worsening recently.  Unable to put much weight on it today prompting her to come to the ED.  Worsening  pain with going up and down stairs.  Patient about appointment with orthopedist on Thursday however began having worsening pain today prompting her to come to the ED.  On arrival vitals are stable.  Patient is afebrile, nontachycardic nontachypneic and appears to be in no acute distress.  It does appear she was also complaining of some right hip pain during triage and therefore x-rays of her hip as well as her knee were obtained prior to being seen.  X-rays of hip and knee negative, no signs of acute bony abnormality.  On exam patient states she has arthritis type pain in her hip  and is not concerned about this.  Her main complaint is her knee.  There is no increased warmth, swelling, redness to the joints.  She has full range of motion however limited secondary to pain.  There is no obvious ligament instability on exam today however patient does report worsening pain with flexing her knee.  Low suspicion for gout, septic arthritis.  Suspect patient may have a meniscal tear. Will provide knee immobilizer and crutches at this time.  Rice therapy, ibuprofen/Tylenol as needed for pain discussed with patient.  She is advised to keep her appointment with orthopedist on Thursday.  She is agreement with plan is stable for discharge home.   This note was prepared using Dragon voice recognition software and may include unintentional dictation errors due to the inherent limitations of voice recognition software.  Final Clinical Impression(s) / ED Diagnoses Final diagnoses:  Acute pain of right knee    Rx / DC Orders ED Discharge Orders    None       Discharge Instructions     Keep appointment with Dr. Sebastian Ache as scheduled for Thursday Use crutches and knee immobilizer as indicated until you can see ortho Take 1,000 mg Tylenol every 8 hours as needed for pain. If you have break through pain you can take 600 mg Ibuprofen at the 4 hour mark (and take every 8 hours as needed).  While at home please rest,  ice, and elevate your knee to help with inflammation  Return to the ED for any worsening symptoms       Eustaquio Maize, PA-C 09/12/20 1443    Veryl Speak, MD 09/12/20 1455

## 2020-09-12 NOTE — ED Triage Notes (Addendum)
Pt having right knee pain.  History of old injury but pain has worsened today.  Pt also c/o right hip pain.

## 2020-09-12 NOTE — Discharge Instructions (Addendum)
Keep appointment with Dr. Sebastian Ache as scheduled for Thursday Use crutches and knee immobilizer as indicated until you can see ortho Take 1,000 mg Tylenol every 8 hours as needed for pain. If you have break through pain you can take 600 mg Ibuprofen at the 4 hour mark (and take every 8 hours as needed).  While at home please rest, ice, and elevate your knee to help with inflammation  Return to the ED for any worsening symptoms

## 2020-09-12 NOTE — ED Notes (Signed)
ED Provider at bedside. 

## 2020-10-18 ENCOUNTER — Other Ambulatory Visit: Payer: Self-pay | Admitting: Orthopedic Surgery

## 2020-10-19 ENCOUNTER — Encounter (HOSPITAL_BASED_OUTPATIENT_CLINIC_OR_DEPARTMENT_OTHER): Payer: Self-pay | Admitting: Orthopedic Surgery

## 2020-10-19 ENCOUNTER — Other Ambulatory Visit: Payer: Self-pay

## 2020-10-20 ENCOUNTER — Other Ambulatory Visit (HOSPITAL_COMMUNITY)
Admission: RE | Admit: 2020-10-20 | Discharge: 2020-10-20 | Disposition: A | Discharge status: Home / Self Care | Payer: 59 | Source: Ambulatory Visit | Attending: Orthopedic Surgery | Admitting: Orthopedic Surgery

## 2020-10-20 DIAGNOSIS — Z01812 Encounter for preprocedural laboratory examination: Secondary | ICD-10-CM | POA: Insufficient documentation

## 2020-10-20 DIAGNOSIS — Z20822 Contact with and (suspected) exposure to covid-19: Secondary | ICD-10-CM | POA: Insufficient documentation

## 2020-10-20 LAB — SARS CORONAVIRUS 2 (TAT 6-24 HRS): SARS Coronavirus 2: NEGATIVE

## 2020-10-21 ENCOUNTER — Ambulatory Visit (HOSPITAL_BASED_OUTPATIENT_CLINIC_OR_DEPARTMENT_OTHER): Admit: 2020-10-21 | Payer: PRIVATE HEALTH INSURANCE | Admitting: Orthopedic Surgery

## 2020-10-21 HISTORY — DX: Gastro-esophageal reflux disease without esophagitis: K21.9

## 2020-10-21 HISTORY — DX: Other tear of medial meniscus, current injury, unspecified knee, initial encounter: S83.249A

## 2020-10-21 SURGERY — ARTHROSCOPY, KNEE
Anesthesia: Choice | Site: Knee | Laterality: Right

## 2021-06-07 IMAGING — MR MR BREAST BILAT WO/W CM
8 of 12 series · 32 of 48 positions shown · IV contrast (gadavist)
Comparison: Previous exam(s).

CLINICAL DATA: 58-year-old female with history of breast reduction
in 0404 presents with 2 episodes of left nipple discharge, yellow in
color with single Birmata Kobene, since her reduction surgery. Recent
diagnostic evaluation demonstrated benign appearing duct ectasia in
the retroareolar aspect without intraductal debris or mass. Family
history of breast cancer in aunt.

LABS:  Creatinine was obtained on site at [HOSPITAL] at [REDACTED] [HOSPITAL].
Results: Creatinine 1.3 mg/dL.
EXAM:
BILATERAL BREAST MRI WITH AND WITHOUT CONTRAST
TECHNIQUE: Multiplanar, multisequence MR images of both breasts were obtained
prior to and following the intravenous administration of 9 ml of
Gadavist.

[Series 2: t2_tirm_tra ipat (a-p) · axial · 3.0mm · 0.78mm/px · 1 of 58 slices shown]
[im 1/58]
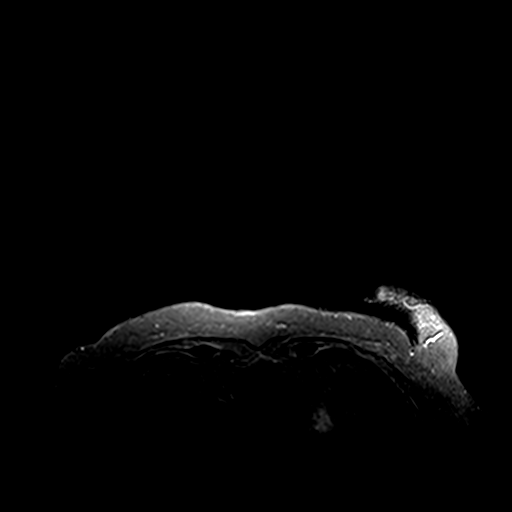

[Series 3: fl3d pre-cm no · axial · non-contrast · 1.2mm · 1.04mm/px · z∈[-78,+93]mm · 5 of 144 slices shown]
[im 1/144]
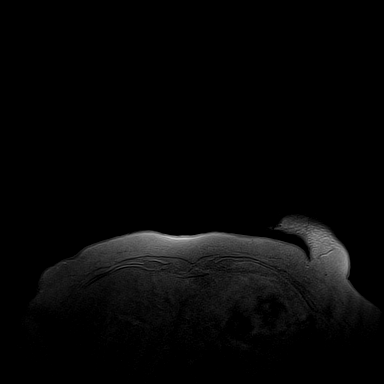
[im 36/144]
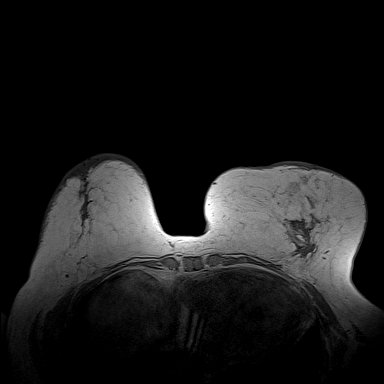
[im 72/144]
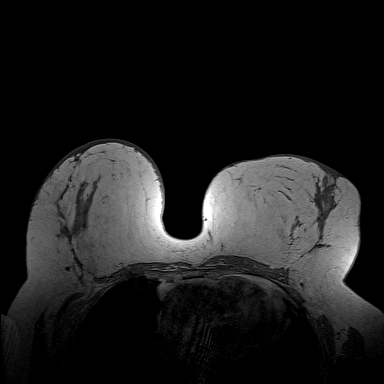
[im 108/144]
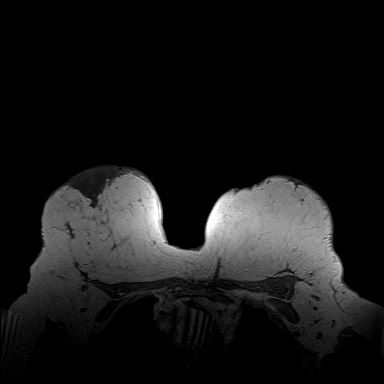
[im 144/144]
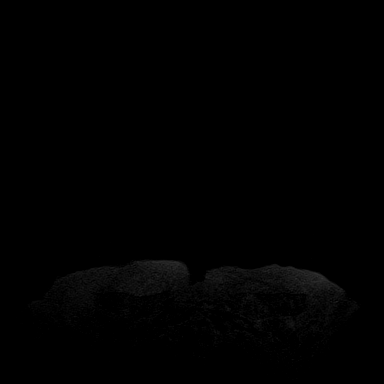

[Series 4: fl3d pre-cm · axial · non-contrast · 1.2mm · 1.04mm/px · z∈[-78,+93]mm · 5 of 144 slices shown]
[im 1/144]
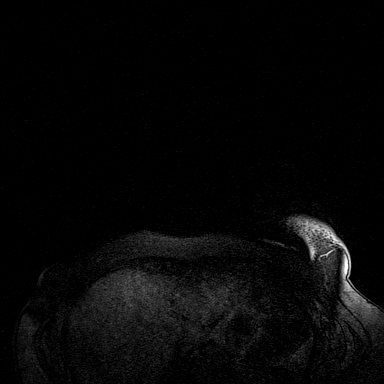
[im 36/144]
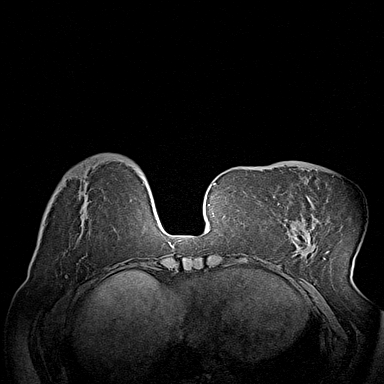
[im 72/144]
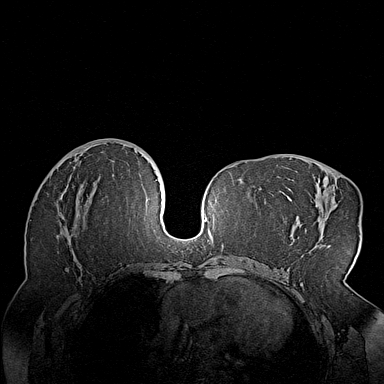
[im 108/144]
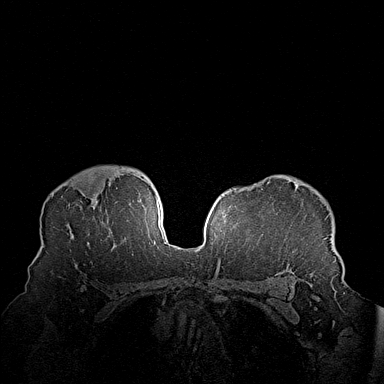
[im 144/144]
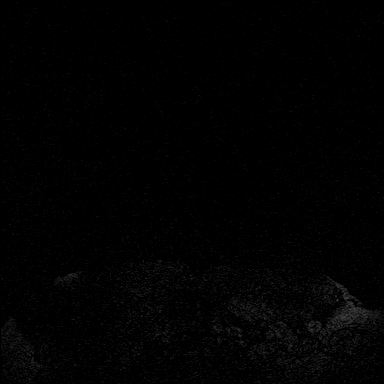

[Series 5: fl3d post-cm 20 · axial · 1.2mm · 1.04mm/px · z∈[-78,+93]mm · 5 of 144 slices shown (1 of 3)]
[im 1/144]
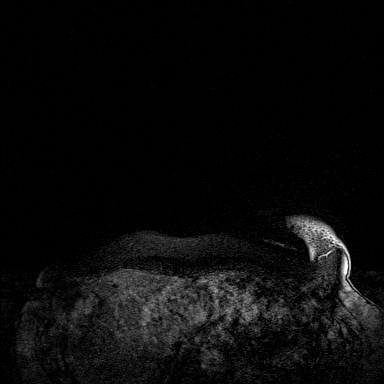
[im 36/144]
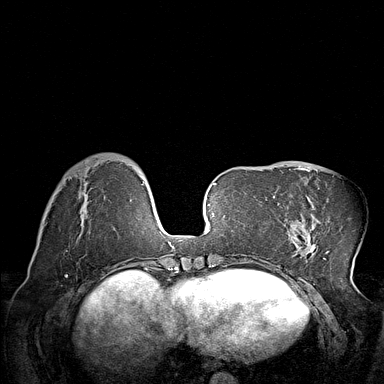
[im 72/144]
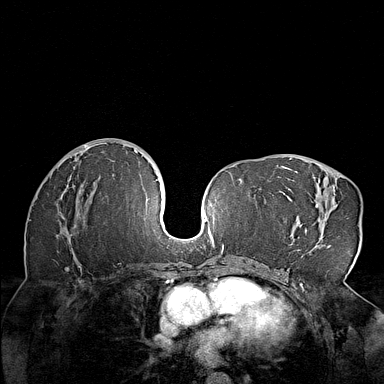
[im 108/144]
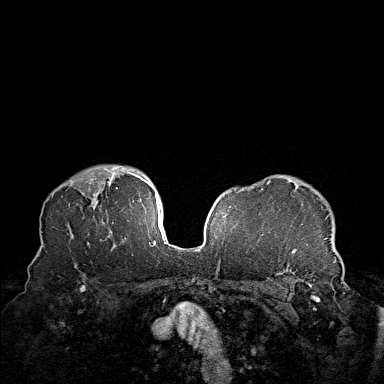
[im 144/144]
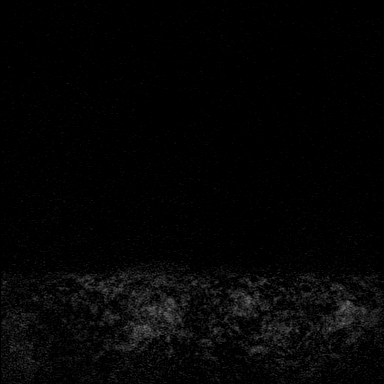

[Series 6: fl3d post-cm 20 · axial · 1.2mm · 1.04mm/px · z∈[-78,+93]mm · 5 of 144 slices shown (2 of 3)]
[im 1/144]
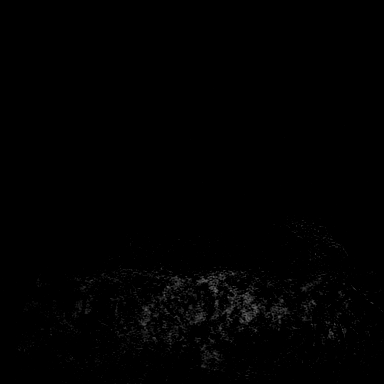
[im 36/144]
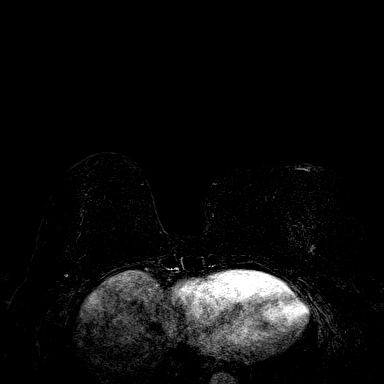
[im 72/144]
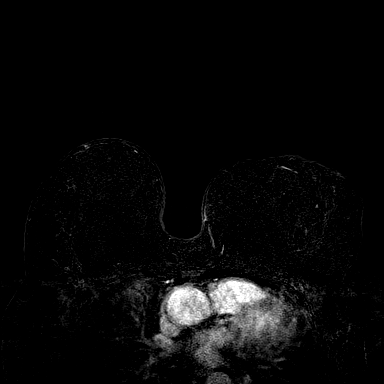
[im 108/144]
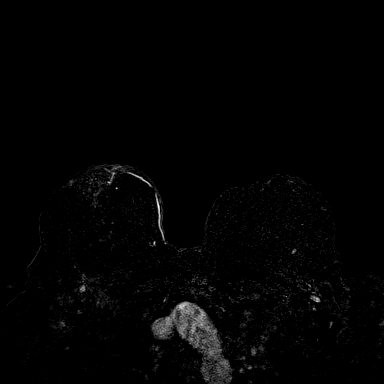
[im 144/144]
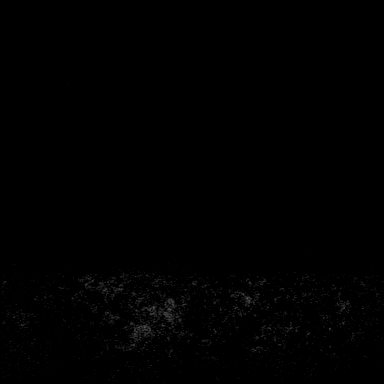

[Series 7: fl3d post-cm 20 · axial · 172.8mm · 1.04mm/px · 1 of 1 slices shown (3 of 3)]
[im 1/1]
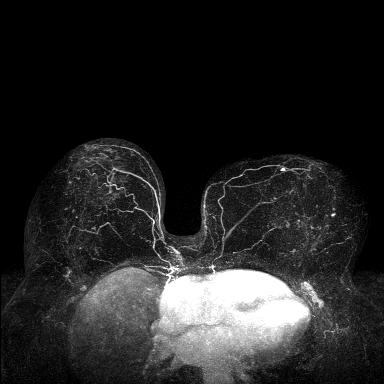

[Series 8: fl3d post-cm 3min · axial · 1.2mm · 1.04mm/px · z∈[-78,+93]mm · 6 of 144 slices shown]
[im 1/144]
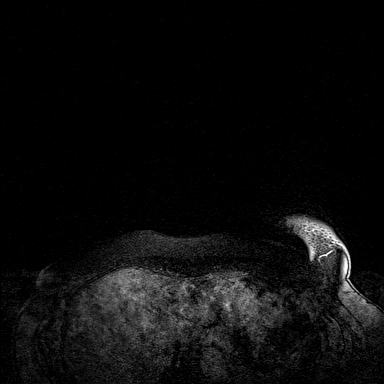
[im 29/144]
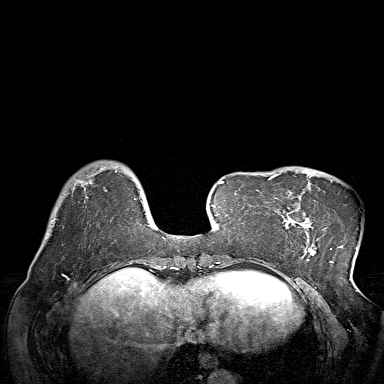
[im 58/144]
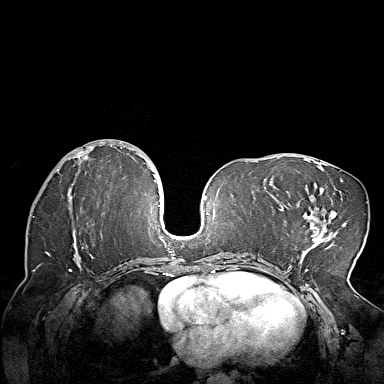
[im 86/144]
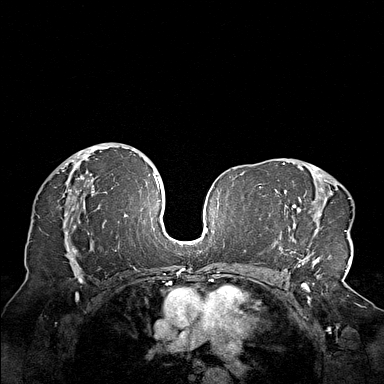
[im 115/144]
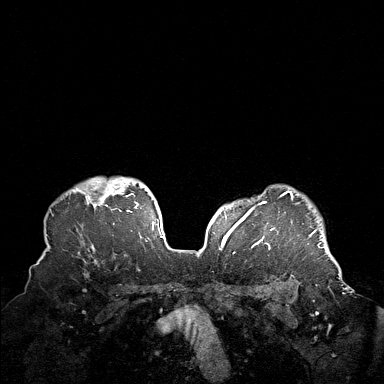
[im 144/144]
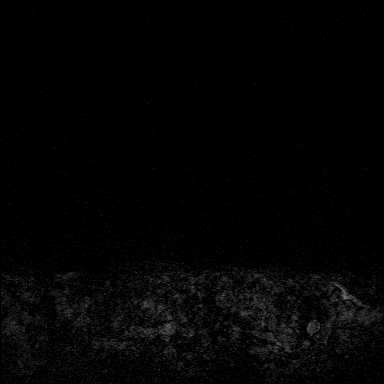

[Series 9: fl3d post-cm 3min_sub · axial · 1.2mm · 1.04mm/px · z∈[-78,+24]mm · 4 of 144 slices shown]
[im 1/144]
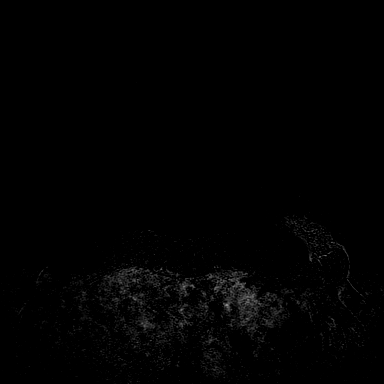
[im 29/144]
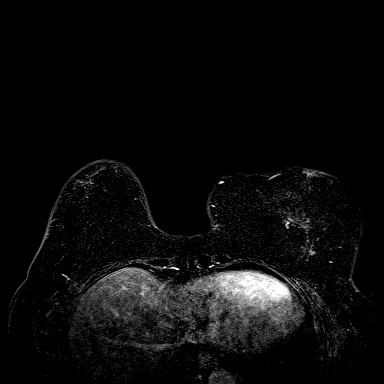
[im 58/144]
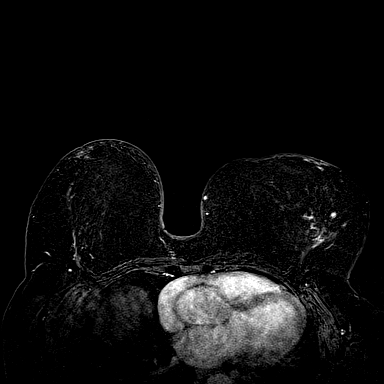
[im 86/144]
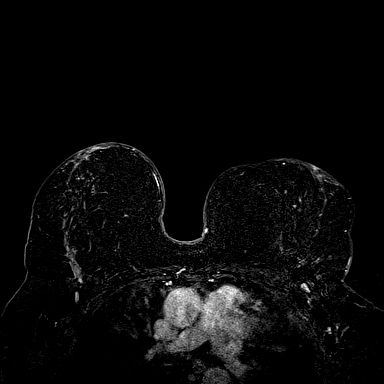

[32 of 48 positions shown; findings below may reference images not displayed]

Three-dimensional MR images were rendered by post-processing of the
original MR data on an independent workstation. The
three-dimensional MR images were interpreted, and findings are
reported in the following complete MRI report for this study. Three
dimensional images were evaluated at the independent [employer]
workstation
FINDINGS: Breast composition: c. Heterogeneous fibroglandular tissue.
Postsurgical scarring related to breast reduction surgery
bilaterally.

Background parenchymal enhancement: Moderate.

Right breast: No dominant mass or suspicious enhancement.

Left breast: There scattered 3-5 mm enhancing foci demonstrating
associated color uptake and progressive/plateau kinetics on advanced
processing. The largest of these measure 5 mm in the lower inner
quadrant anteriorly (series 9, image 94/144) and lower outer
quadrant at middle depth (series 9, image 87/144).

Lymph nodes: No abnormal appearing lymph nodes.

Ancillary findings:  None.
IMPRESSION: 1. Two, indeterminate 5 mm enhancing foci in the lower inner and
lower outer left breast (series 9, images 94 and 87 of 144).
Recommendation is for MRI guided biopsy.
2. No MRI evidence of malignancy on the right.
3. No suspicious lymphadenopathy.
4. Bilateral post reduction changes.

RECOMMENDATION:
Two area MRI guided biopsy of the left breast.

BI-RADS CATEGORY  4: Suspicious.

## 2021-07-08 ENCOUNTER — Emergency Department (HOSPITAL_COMMUNITY): Payer: 59

## 2021-07-08 ENCOUNTER — Other Ambulatory Visit: Payer: Self-pay

## 2021-07-08 ENCOUNTER — Emergency Department (HOSPITAL_COMMUNITY)
Admission: EM | Admit: 2021-07-08 | Discharge: 2021-07-09 | Disposition: A | Discharge status: Home / Self Care | Payer: 59 | Attending: Emergency Medicine | Admitting: Emergency Medicine

## 2021-07-08 DIAGNOSIS — E119 Type 2 diabetes mellitus without complications: Secondary | ICD-10-CM | POA: Insufficient documentation

## 2021-07-08 DIAGNOSIS — R1010 Upper abdominal pain, unspecified: Secondary | ICD-10-CM | POA: Diagnosis present

## 2021-07-08 DIAGNOSIS — Z79899 Other long term (current) drug therapy: Secondary | ICD-10-CM | POA: Diagnosis not present

## 2021-07-08 DIAGNOSIS — I10 Essential (primary) hypertension: Secondary | ICD-10-CM | POA: Diagnosis not present

## 2021-07-08 DIAGNOSIS — R1013 Epigastric pain: Secondary | ICD-10-CM

## 2021-07-08 DIAGNOSIS — K8020 Calculus of gallbladder without cholecystitis without obstruction: Secondary | ICD-10-CM | POA: Diagnosis not present

## 2021-07-08 LAB — COMPREHENSIVE METABOLIC PANEL
ALT: 16 U/L (ref 0–44)
AST: 20 U/L (ref 15–41)
Albumin: 3.7 g/dL (ref 3.5–5.0)
Alkaline Phosphatase: 70 U/L (ref 38–126)
Anion gap: 10 (ref 5–15)
BUN: 22 mg/dL (ref 8–23)
CO2: 25 mmol/L (ref 22–32)
Calcium: 9.8 mg/dL (ref 8.9–10.3)
Chloride: 104 mmol/L (ref 98–111)
Creatinine, Ser: 1.54 mg/dL — ABNORMAL HIGH (ref 0.44–1.00)
GFR, Estimated: 38 mL/min — ABNORMAL LOW (ref >60)
Glucose, Bld: 155 mg/dL — ABNORMAL HIGH (ref 70–99)
Potassium: 3.8 mmol/L (ref 3.5–5.1)
Sodium: 139 mmol/L (ref 135–145)
Total Bilirubin: 0.5 mg/dL (ref 0.3–1.2)
Total Protein: 7.1 g/dL (ref 6.5–8.1)

## 2021-07-08 LAB — CBC
HCT: 40.8 % (ref 36.0–46.0)
Hemoglobin: 12.5 g/dL (ref 12.0–15.0)
MCH: 28.6 pg (ref 26.0–34.0)
MCHC: 30.6 g/dL (ref 30.0–36.0)
MCV: 93.4 fL (ref 80.0–100.0)
Platelets: 242 10*3/uL (ref 150–400)
RBC: 4.37 MIL/uL (ref 3.87–5.11)
RDW: 13.3 % (ref 11.5–15.5)
WBC: 11.8 10*3/uL — ABNORMAL HIGH (ref 4.0–10.5)
nRBC: 0 % (ref 0.0–0.2)

## 2021-07-08 LAB — URINALYSIS, ROUTINE W REFLEX MICROSCOPIC
Bilirubin Urine: NEGATIVE
Glucose, UA: 50 mg/dL — AB
Hgb urine dipstick: NEGATIVE
Ketones, ur: 5 mg/dL — AB
Leukocytes,Ua: NEGATIVE
Nitrite: NEGATIVE
Protein, ur: NEGATIVE mg/dL
Specific Gravity, Urine: 1.014 (ref 1.005–1.030)
pH: 6 (ref 5.0–8.0)

## 2021-07-08 LAB — TROPONIN I (HIGH SENSITIVITY)
Troponin I (High Sensitivity): 6 ng/L (ref <18)
Troponin I (High Sensitivity): 7 ng/L (ref <18)

## 2021-07-08 LAB — LIPASE, BLOOD: Lipase: 43 U/L (ref 11–51)

## 2021-07-08 NOTE — ED Triage Notes (Signed)
The pt has had epigastric pain since this am  severe pain with nausea and vomiting the pain goes around rt around to the rt back  no previous history

## 2021-07-09 MED ORDER — PANTOPRAZOLE SODIUM 20 MG PO TBEC: 20.0000 mg | DELAYED_RELEASE_TABLET | Freq: Every day | ORAL | 0 refills | Status: DC

## 2021-07-09 MED ORDER — SUCRALFATE 1 G PO TABS: 1.0000 g | ORAL_TABLET | Freq: Three times a day (TID) | ORAL | 1 refills | Status: DC

## 2021-07-09 MED ORDER — FAMOTIDINE 20 MG PO TABS: 20.0000 mg | ORAL_TABLET | Freq: Two times a day (BID) | ORAL | 0 refills | Status: DC

## 2021-07-09 NOTE — ED Provider Notes (Signed)
[facility] EMERGENCY DEPARTMENT Provider Note   CSN: [id number] Arrival date & time: 07/08/21  1918     History Chief Complaint  Patient presents with   Abdominal Pain    Tara Holland is a 60 y.o. female.  Patient with self-reported history of peptic ulcer disease, hypertension, previous hysterectomy --presents the emergency department for evaluation of upper abdominal pain.  Symptoms started last evening after eating.  She describes pain in her mid upper abdomen with radiation to her back.  She had associated vomiting.  Pain was severe so she came to the emergency department.  During extended wait time, pain resolved.  She denies associated chest pain or shortness of breath.  No diarrhea.  No fevers.  She does get pain in her upper abdomen, worse in the morning, worse with tomatoes and spicy foods including pepper.  When she gets that she takes Tums which seems to help.  Symptoms are not exertional.  No diaphoresis.  She has had some mild swelling in her legs, but not unilateral. Patient denies risk factors for pulmonary embolism including: unilateral leg swelling, history of DVT/PE/other blood clots, use of exogenous hormones, recent immobilizations, recent surgery, recent travel (>4hr segment), malignancy, hemoptysis.         Past Medical History:  Diagnosis Date   GERD (gastroesophageal reflux disease)    Hypertension    MMT (medial meniscus tear)    right knee   Pain in joint, hand    Pre-diabetes    Pure hypercholesterolemia    Symptomatic mammary hypertrophy    Vaginal irritation     Patient Active Problem List   Diagnosis Date Noted   Symptomatic mammary hypertrophy 02/12/2019   Neck pain 02/12/2019   DM (diabetes mellitus), type 2 ([place]) 11/28/2017   Facial droop 11/27/2017   Malignant hypertension 11/27/2017   Hyperglycemia 11/27/2017   Abdominal pain 05/24/2016   Spasm of vocal cords 11/11/2015   Back pain 04/21/2009   VENTRICULAR  HYPERTROPHY, LEFT 02/17/2009   OBESITY 02/02/2009   Essential hypertension 01/04/2009   HYPERCHOLESTEROLEMIA 02/06/2008    Past Surgical History:  Procedure Laterality Date   BREAST LUMPECTOMY     BREAST REDUCTION SURGERY Bilateral 04/23/2019   Procedure: MAMMARY REDUCTION  (BREAST);  Surgeon: Wallace Going, DO;  Location: [place];  Service: Plastics;  Laterality: Bilateral;   VAGINAL HYSTERECTOMY       OB History   No obstetric history on file.     Family History  Problem Relation Age of Onset   Hypertension Mother    Kidney disease Mother    CVA Mother 65   Kidney disease Brother     Social History   Tobacco Use   Smoking status: Never   Smokeless tobacco: Never  Vaping Use   Vaping Use: Never used  Substance Use Topics   Alcohol use: No   Drug use: No    Home Medications Prior to Admission medications   Medication Sig Start Date End Date Taking? Authorizing Provider  famotidine (PEPCID) 20 MG tablet Take 1 tablet (20 mg total) by mouth 2 (two) times daily. 07/09/21  Yes Carlisle Cater, PA-C  pantoprazole (PROTONIX) 20 MG tablet Take 1 tablet (20 mg total) by mouth daily. 07/09/21  Yes Carlisle Cater, PA-C  sucralfate (CARAFATE) 1 g tablet Take 1 tablet (1 g total) by mouth 4 (four) times daily -  with meals and at bedtime. 07/09/21  Yes Carlisle Cater, PA-C  amLODipine-benazepril (LOTREL) 5-20  MG capsule Take by mouth. 04/06/19   [provider]  cholecalciferol (VITAMIN D3) 25 MCG (1000 UNIT) tablet Take 1,000 Units by mouth daily.    [provider]  Multiple Vitamin (MULTI-VITAMIN DAILY PO) Multi Vitamin  daily    [provider]    Allergies    Sulfonamide derivatives  Review of Systems   Review of Systems  Constitutional:  Negative for fever.  HENT:  Negative for rhinorrhea and sore throat.   Eyes:  Negative for redness.  Respiratory:  Negative for cough.   Cardiovascular:  Negative for chest pain.   Gastrointestinal:  Positive for abdominal pain, nausea and vomiting. Negative for diarrhea.  Genitourinary:  Negative for dysuria, frequency, hematuria and urgency.  Musculoskeletal:  Negative for myalgias.  Skin:  Negative for rash.  Neurological:  Negative for headaches.   Physical Exam Updated Vital Signs BP (!) 166/94 (BP Location: Right Arm)   Pulse (!) 59   Temp 98.1 F (36.7 C) (Oral)   Resp 16   Ht 5\' 10"  (1.778 m)   Wt 89.8 kg   SpO2 99%   BMI 28.41 kg/m   Physical Exam Vitals and nursing note reviewed.  Constitutional:      General: She is not in acute distress.    Appearance: She is well-developed.  HENT:     Head: Normocephalic and atraumatic.     Right Ear: External ear normal.     Left Ear: External ear normal.     Nose: Nose normal.  Eyes:     Conjunctiva/sclera: Conjunctivae normal.  Cardiovascular:     Rate and Rhythm: Normal rate and regular rhythm.     Heart sounds: No murmur heard. Pulmonary:     Effort: No respiratory distress.     Breath sounds: No wheezing, rhonchi or rales.  Abdominal:     Palpations: Abdomen is soft.     Tenderness: There is no abdominal tenderness. There is no guarding or rebound.  Musculoskeletal:     Cervical back: Normal range of motion and neck supple.     Right lower leg: No edema.     Left lower leg: No edema.  Skin:    General: Skin is warm and dry.     Findings: No rash.  Neurological:     General: No focal deficit present.     Mental Status: She is alert. Mental status is at baseline.     Motor: No weakness.  Psychiatric:        Mood and Affect: Mood normal.    ED Results / Procedures / Treatments   Labs (all labs ordered are listed, but only abnormal results are displayed) Labs Reviewed  COMPREHENSIVE METABOLIC PANEL - Abnormal; Notable for the following components:      Result Value   Glucose, Bld 155 (*)    Creatinine, Ser 1.54 (*)    GFR, Estimated 38 (*)    All other components within normal  limits  CBC - Abnormal; Notable for the following components:   WBC 11.8 (*)    All other components within normal limits  URINALYSIS, ROUTINE W REFLEX MICROSCOPIC - Abnormal; Notable for the following components:   Color, Urine STRAW (*)    Glucose, UA 50 (*)    Ketones, ur 5 (*)    All other components within normal limits  LIPASE, BLOOD  TROPONIN I (HIGH SENSITIVITY)  TROPONIN I (HIGH SENSITIVITY)    EKG EKG Interpretation  Date/Time:  Saturday July 08 2021  21:26:26 EDT Ventricular Rate:  57 PR Interval:  184 QRS Duration: 114 QT Interval:  442 QTC Calculation: 430 R Axis:   21 Text Interpretation: Sinus bradycardia Minimal voltage criteria for LVH, may be normal variant ( Cornell product ) Borderline ECG Since last tracing rate slower Confirmed by Dorie Rank [phone]) on 07/09/2021 7:08:23 AM  Radiology US Abdomen Limited RUQ (LIVER/GB)  Result Date: 07/08/2021 CLINICAL DATA:  Abdominal pain EXAM: ULTRASOUND ABDOMEN LIMITED RIGHT UPPER QUADRANT COMPARISON:  06/05/2016 FINDINGS: Gallbladder: Gallbladder is well distended. Dependent gallstone is noted within. No wall thickening or pericholecystic fluid is noted. Negative sonographic Murphy's sign is elicited. Common bile duct: Diameter: 6.7 mm, within normal limits for the patient's given age. Liver: Fatty infiltration of the liver is noted. Area of focal fatty sparing is noted adjacent to the gallbladder. Portal vein is patent on color Doppler imaging with normal direction of blood flow towards the liver. Other: None. IMPRESSION: Fatty liver. Cholelithiasis without complicating factors. Electronically Signed   By: Inez Catalina M.D.   On: 07/08/2021 21:28    Procedures Procedures   Medications Ordered in ED Medications - No data to display  ED Course  I have reviewed the triage vital signs and the nursing notes.  Pertinent labs & imaging results that were available during my care of the patient were reviewed by me and  considered in my medical decision making (see chart for details).  Patient seen and examined.  Work-up and imaging performed overnight.  Patient does have a gallstone noted on ultrasound.  No signs of cholecystitis.  Vital signs reviewed and are as follows: BP (!) 166/94 (BP Location: Right Arm)   Pulse (!) 59   Temp 98.1 F (36.7 C) (Oral)   Resp 16   Ht 5\' 10"  (1.778 m)   Wt 89.8 kg   SpO2 99%   BMI 28.41 kg/m   Per history, symptoms are most consistent with peptic ulcer disease or gastritis.  However, cannot entirely rule out cholelithiasis.  In any case, patient symptoms are now resolved.  Her labs are reassuring.  No indication for further testing or work-up here.  Plan: Discharged with Protonix, Pepcid, Carafate.  Patient will contact her primary care provider this week for follow-up.  She states that she last had an endoscopy 7 months ago and this showed "2 small ulcers".  She is not currently on any PPI or H2 blockers per her report.  She states that she is hungry and would like to be discharged so that she can go to church this morning.  The patient was urged to return to the Emergency Department immediately with worsening of current symptoms, worsening abdominal pain, persistent vomiting, blood noted in stools, fever, or any other concerns. The patient verbalized understanding.     MDM Rules/Calculators/A&P                           Patient with upper abdominal pain.  Normal LFTs and lipase.  Cardiac markers negative x2 and EKG without signs of ischemia.  Symptoms are nonexertional.  They do occur after eating and are worse in the morning chronically.  Most consistent with peptic ulcer disease/gastritis.  She does have a gallstone however on ultrasound without signs of cholecystitis.  She will follow-up with her primary care doctor.  Will initiate PPI/H2 blocker therapy.  Labs are reassuring.  Elevated creatinine at 1.5 but this appears to be baseline.  WBC only minimally  elevated.    Final Clinical Impression(s) / ED Diagnoses Final diagnoses:  Epigastric abdominal pain  Calculus of gallbladder without cholecystitis without obstruction    Rx / DC Orders ED Discharge Orders          Ordered    pantoprazole (PROTONIX) 20 MG tablet  Daily        07/09/21 0742    famotidine (PEPCID) 20 MG tablet  2 times daily        07/09/21 0742    sucralfate (CARAFATE) 1 g tablet  3 times daily with meals & bedtime        07/09/21 0742             Carlisle Cater, PA-C 07/09/21 0750    Dorie Rank, MD 07/10/21 [phone]

## 2021-07-09 NOTE — Discharge Instructions (Addendum)
Please read and follow all provided instructions.  Your diagnoses today include:  1. Epigastric abdominal pain   2. Calculus of gallbladder without cholecystitis without obstruction     Tests performed today include: Blood cell counts and platelets Kidney and liver function tests Pancreas function test (called lipase) Urine test to look for infection EKG - no sign of heart problems Cardiac enzymes - no sign of stress on the heart Vital signs. See below for your results today.   Medications prescribed:  Pantoprazole (Protonix) - stomach acid reducer  Pepcid (famotidine) - antihistamine  You can find this medication over-the-counter.   DO NOT exceed:  20mg  Pepcid every 12 hours  Carafate - for stomach upset and to protect your stomach  This medication can be found over-the-counter  Take any prescribed medications only as directed.  Home care instructions:  Follow any educational materials contained in this packet.  Follow-up instructions: Please follow-up with your primary care provider in the next 3 days for further evaluation of your symptoms.    Return instructions:  SEEK IMMEDIATE MEDICAL ATTENTION IF: The pain does not go away or becomes severe  A temperature above 101F develops  Repeated vomiting occurs (multiple episodes)  The pain becomes localized to portions of the abdomen. The right side could possibly be appendicitis. In an adult, the left lower portion of the abdomen could be colitis or diverticulitis.  Blood is being passed in stools or vomit (bright red or black tarry stools)  You develop chest pain, difficulty breathing, dizziness or fainting, or become confused, poorly responsive, or inconsolable (young children) If you have any other emergent concerns regarding your health  Additional Information: Abdominal (belly) pain can be caused by many things. Your caregiver performed an examination and possibly ordered blood/urine tests and imaging (CT scan,  x-rays, ultrasound). Many cases can be observed and treated at home after initial evaluation in the emergency department. Even though you are being discharged home, abdominal pain can be unpredictable. Therefore, you need a repeated exam if your pain does not resolve, returns, or worsens. Most patients with abdominal pain don't have to be admitted to the hospital or have surgery, but serious problems like appendicitis and gallbladder attacks can start out as nonspecific pain. Many abdominal conditions cannot be diagnosed in one visit, so follow-up evaluations are very important.  Your vital signs today were: BP (!) 169/101 (BP Location: Right Arm)   Pulse (!) 57   Temp 98.7 F (37.1 C) (Oral)   Resp 15   Ht 5\' 10"  (1.778 m)   Wt 89.8 kg   SpO2 100%   BMI 28.41 kg/m  If your blood pressure (bp) was elevated above 135/85 this visit, please have this repeated by your doctor within one month. --------------

## 2021-07-15 ENCOUNTER — Observation Stay (HOSPITAL_COMMUNITY)
Admission: EM | Admit: 2021-07-15 | Discharge: 2021-07-17 | Disposition: A | Discharge status: Home / Self Care | Payer: 59 | Attending: Emergency Medicine | Admitting: Emergency Medicine

## 2021-07-15 ENCOUNTER — Emergency Department (HOSPITAL_COMMUNITY): Payer: 59

## 2021-07-15 ENCOUNTER — Other Ambulatory Visit: Payer: Self-pay

## 2021-07-15 ENCOUNTER — Encounter (HOSPITAL_COMMUNITY): Payer: Self-pay

## 2021-07-15 DIAGNOSIS — I10 Essential (primary) hypertension: Secondary | ICD-10-CM | POA: Insufficient documentation

## 2021-07-15 DIAGNOSIS — K8000 Calculus of gallbladder with acute cholecystitis without obstruction: Secondary | ICD-10-CM | POA: Diagnosis not present

## 2021-07-15 DIAGNOSIS — Z20822 Contact with and (suspected) exposure to covid-19: Secondary | ICD-10-CM | POA: Insufficient documentation

## 2021-07-15 DIAGNOSIS — E119 Type 2 diabetes mellitus without complications: Secondary | ICD-10-CM | POA: Diagnosis not present

## 2021-07-15 DIAGNOSIS — Z79899 Other long term (current) drug therapy: Secondary | ICD-10-CM | POA: Diagnosis not present

## 2021-07-15 DIAGNOSIS — K829 Disease of gallbladder, unspecified: Secondary | ICD-10-CM

## 2021-07-15 DIAGNOSIS — K810 Acute cholecystitis: Secondary | ICD-10-CM | POA: Diagnosis present

## 2021-07-15 DIAGNOSIS — R1011 Right upper quadrant pain: Secondary | ICD-10-CM | POA: Diagnosis present

## 2021-07-15 LAB — URINALYSIS, ROUTINE W REFLEX MICROSCOPIC
Bilirubin Urine: NEGATIVE
Glucose, UA: 50 mg/dL — AB
Hgb urine dipstick: NEGATIVE
Ketones, ur: NEGATIVE mg/dL
Leukocytes,Ua: NEGATIVE
Nitrite: NEGATIVE
Protein, ur: 30 mg/dL — AB
Specific Gravity, Urine: 1.01 (ref 1.005–1.030)
pH: 6 (ref 5.0–8.0)

## 2021-07-15 LAB — CBC WITH DIFFERENTIAL/PLATELET
Abs Immature Granulocytes: 0.04 10*3/uL (ref 0.00–0.07)
Basophils Absolute: 0 10*3/uL (ref 0.0–0.1)
Basophils Relative: 0 %
Eosinophils Absolute: 0.1 10*3/uL (ref 0.0–0.5)
Eosinophils Relative: 0 %
HCT: 43.3 % (ref 36.0–46.0)
Hemoglobin: 13.6 g/dL (ref 12.0–15.0)
Immature Granulocytes: 0 %
Lymphocytes Relative: 17 %
Lymphs Abs: 2.2 10*3/uL (ref 0.7–4.0)
MCH: 28.8 pg (ref 26.0–34.0)
MCHC: 31.4 g/dL (ref 30.0–36.0)
MCV: 91.5 fL (ref 80.0–100.0)
Monocytes Absolute: 0.8 10*3/uL (ref 0.1–1.0)
Monocytes Relative: 6 %
Neutro Abs: 10 10*3/uL — ABNORMAL HIGH (ref 1.7–7.7)
Neutrophils Relative %: 77 %
Platelets: 262 10*3/uL (ref 150–400)
RBC: 4.73 MIL/uL (ref 3.87–5.11)
RDW: 13.1 % (ref 11.5–15.5)
WBC: 13.1 10*3/uL — ABNORMAL HIGH (ref 4.0–10.5)
nRBC: 0 % (ref 0.0–0.2)

## 2021-07-15 LAB — COMPREHENSIVE METABOLIC PANEL
ALT: 14 U/L (ref 0–44)
AST: 18 U/L (ref 15–41)
Albumin: 4.3 g/dL (ref 3.5–5.0)
Alkaline Phosphatase: 73 U/L (ref 38–126)
Anion gap: 10 (ref 5–15)
BUN: 12 mg/dL (ref 8–23)
CO2: 29 mmol/L (ref 22–32)
Calcium: 10.2 mg/dL (ref 8.9–10.3)
Chloride: 105 mmol/L (ref 98–111)
Creatinine, Ser: 1.23 mg/dL — ABNORMAL HIGH (ref 0.44–1.00)
GFR, Estimated: 50 mL/min — ABNORMAL LOW (ref >60)
Glucose, Bld: 116 mg/dL — ABNORMAL HIGH (ref 70–99)
Potassium: 3.6 mmol/L (ref 3.5–5.1)
Sodium: 144 mmol/L (ref 135–145)
Total Bilirubin: 0.7 mg/dL (ref 0.3–1.2)
Total Protein: 8.6 g/dL — ABNORMAL HIGH (ref 6.5–8.1)

## 2021-07-15 LAB — LIPASE, BLOOD: Lipase: 37 U/L (ref 11–51)

## 2021-07-15 MED ORDER — DOCUSATE SODIUM 100 MG PO CAPS
100.0000 mg | ORAL_CAPSULE | Freq: Two times a day (BID) | ORAL | Status: DC
  Administered 2021-07-17: 100 mg via ORAL
  Filled 2021-07-15 (×2): qty 1

## 2021-07-15 MED ORDER — OXYCODONE HCL 5 MG PO TABS: 10.0000 mg | ORAL_TABLET | ORAL | Status: DC | PRN

## 2021-07-15 MED ORDER — PROCHLORPERAZINE EDISYLATE 10 MG/2ML IJ SOLN: 10.0000 mg | INTRAMUSCULAR | Status: DC | PRN

## 2021-07-15 MED ORDER — LACTATED RINGERS IV SOLN: INTRAVENOUS | Status: DC

## 2021-07-15 MED ORDER — METHOCARBAMOL 1000 MG/10ML IJ SOLN
500.0000 mg | Freq: Four times a day (QID) | INTRAVENOUS | Status: DC | PRN
  Filled 2021-07-15: qty 5

## 2021-07-15 MED ORDER — OXYCODONE HCL 5 MG PO TABS: 5.0000 mg | ORAL_TABLET | ORAL | Status: DC | PRN

## 2021-07-15 MED ORDER — ACETAMINOPHEN 325 MG PO TABS
650.0000 mg | ORAL_TABLET | Freq: Four times a day (QID) | ORAL | Status: DC
  Administered 2021-07-16 – 2021-07-17 (×4): 650 mg via ORAL
  Filled 2021-07-15 (×5): qty 2

## 2021-07-15 MED ORDER — SODIUM CHLORIDE 0.9 % IV SOLN
2.0000 g | INTRAVENOUS | Status: DC
  Administered 2021-07-16 (×2): 2 g via INTRAVENOUS
  Filled 2021-07-15 (×2): qty 20

## 2021-07-15 MED ORDER — ENOXAPARIN SODIUM 40 MG/0.4ML IJ SOSY
40.0000 mg | PREFILLED_SYRINGE | INTRAMUSCULAR | Status: DC
  Administered 2021-07-16 (×2): 40 mg via SUBCUTANEOUS
  Filled 2021-07-15 (×2): qty 0.4

## 2021-07-15 MED ORDER — SIMETHICONE 80 MG PO CHEW
80.0000 mg | CHEWABLE_TABLET | Freq: Four times a day (QID) | ORAL | Status: DC | PRN
  Filled 2021-07-15: qty 1

## 2021-07-15 MED ORDER — HYDROMORPHONE HCL 1 MG/ML IJ SOLN
0.5000 mg | INTRAMUSCULAR | Status: DC | PRN
Start: 2021-07-15 — End: 2021-07-17

## 2021-07-15 MED ORDER — ONDANSETRON HCL 4 MG/2ML IJ SOLN: 4.0000 mg | Freq: Four times a day (QID) | INTRAMUSCULAR | Status: DC | PRN

## 2021-07-15 NOTE — ED Triage Notes (Signed)
Patient c/o upper abdominal pain that radiates into the upper back and N/V. Patient was seen at [facility] last week and states she was told she had gallstones and told them she did not want surgery, but states she has changed her mind.

## 2021-07-15 NOTE — ED Provider Notes (Signed)
Emergency Medicine Provider Triage Evaluation Note  Tara Holland , a 60 y.o. female  was evaluated in triage.  Pt complains of abd pain.  Review of Systems  Positive: Abd pain, n/v Negative: Fever, cp, sob  Physical Exam  BP (!) 156/91 (BP Location: Right Arm)   Pulse 71   Temp 99.3 F (37.4 C) (Oral)   Resp 18   Ht 5\' 10"  (1.778 m)   Wt 89.8 kg   SpO2 96%   BMI 28.41 kg/m  Gen:   Awake, tearful, uncomfortable Resp:  Normal effort  MSK:   Moves extremities without difficulty  Other:  TTP epigastric and RUQ  Medical Decision Making  Medically screening exam initiated at 2:27 PM.  Appropriate orders placed.  SARIT SPARANO was informed that the remainder of the evaluation will be completed by another provider, this initial triage assessment does not replace that evaluation, and the importance of remaining in the ED until their evaluation is complete.  Hx of cholelithiasis diagnosed last week. Here with severe upper abd pain started at 4am today, Carroll Sage, PA-C 07/15/21 1427    Luna Fuse, MD 07/15/21 Lattie Corns    Luna Fuse, MD 07/15/21 2015

## 2021-07-15 NOTE — H&P (Signed)
Admitting Physician: Nickola Major Beckham Capistran  Service: General Surgery  CC: Abdominal pain  Subjective   HPI: Tara Holland is an 60 y.o. female who is here for abdominal pain.   She had pain about a week ago and presented to the ER.  Cholecystectomy was mentioned at that point but she was not interested in having surgery and attempted to control her symptoms.  Her symptoms returned about 24 hours prior to admission.  She has severe pain in her right upper quadrant which is actually worse into her back.  She has associated nausea and vomiting.  At this point its bad with eating just about anything.  At this point she is ready to have surgery to have her gallbladder removed.   Past Medical History:  Diagnosis Date   GERD (gastroesophageal reflux disease)    Hypertension    MMT (medial meniscus tear)    right knee   Pain in joint, hand    Pre-diabetes    Pure hypercholesterolemia    Symptomatic mammary hypertrophy    Vaginal irritation     Past Surgical History:  Procedure Laterality Date   BREAST LUMPECTOMY     BREAST REDUCTION SURGERY Bilateral 04/23/2019   Procedure: MAMMARY REDUCTION  (BREAST);  Surgeon: Wallace Going, DO;  Location: [place];  Service: Plastics;  Laterality: Bilateral;   VAGINAL HYSTERECTOMY      Family History  Problem Relation Age of Onset   Hypertension Mother    Kidney disease Mother    CVA Mother 59   Kidney disease Brother     Social:  reports that she has never smoked. She has never used smokeless tobacco. She reports that she does not drink alcohol and does not use drugs.  Allergies:  Allergies  Allergen Reactions   Sulfonamide Derivatives Itching    Severe itching    Medications: Current Outpatient Medications  Medication Instructions   amLODipine-benazepril (LOTREL) 5-20 MG capsule Oral   cholecalciferol (VITAMIN D3) 1,000 Units, Oral, Daily   famotidine (PEPCID) 20 mg, Oral, 2 times daily   Multiple  Vitamin (MULTI-VITAMIN DAILY PO) Multi Vitamin  daily   pantoprazole (PROTONIX) 20 mg, Oral, Daily   sucralfate (CARAFATE) 1 g, Oral, 3 times daily with meals & bedtime    ROS - all of the below systems have been reviewed with the patient and positives are indicated with bold text General: chills, fever or night sweats Eyes: blurry vision or double vision ENT: epistaxis or sore throat Allergy/Immunology: itchy/watery eyes or nasal congestion Hematologic/Lymphatic: bleeding problems, blood clots or swollen lymph nodes Endocrine: temperature intolerance or unexpected weight changes Breast: new or changing breast lumps or nipple discharge Resp: cough, shortness of breath, or wheezing CV: chest pain or dyspnea on exertion GI: as per HPI GU: dysuria, trouble voiding, or hematuria MSK: joint pain or joint stiffness Neuro: TIA or stroke symptoms Derm: pruritus and skin lesion changes Psych: anxiety and depression  Objective   PE Blood pressure (!) 150/88, pulse 83, temperature 99.3 F (37.4 C), temperature source Oral, resp. rate 18, height 5\' 10"  (1.778 m), weight 89.8 kg, SpO2 99 %. Constitutional: NAD; conversant; no deformities Eyes: Moist conjunctiva; no lid lag; anicteric; PERRL Neck: Trachea midline; no thyromegaly Lungs: Normal respiratory effort; no tactile fremitus CV: RRR; no palpable thrills; no pitting edema GI: Abd Soft, tender RUQ; no palpable hepatosplenomegaly MSK: Normal range of motion of extremities; no clubbing/cyanosis Psychiatric: Appropriate affect; alert and oriented x3 Lymphatic: No palpable cervical  or axillary lymphadenopathy  Results for orders placed or performed during the hospital encounter of 07/15/21 (from the past 24 hour(s))  Urinalysis, Routine w reflex microscopic Urine, Clean Catch     Status: Abnormal   Collection Time: 07/15/21  6:00 PM  Result Value Ref Range   Color, Urine YELLOW YELLOW   APPearance CLEAR CLEAR   Specific Gravity, Urine  1.010 1.005 - 1.030   pH 6.0 5.0 - 8.0   Glucose, UA 50 (A) NEGATIVE mg/dL   Hgb urine dipstick NEGATIVE NEGATIVE   Bilirubin Urine NEGATIVE NEGATIVE   Ketones, ur NEGATIVE NEGATIVE mg/dL   Protein, ur 30 (A) NEGATIVE mg/dL   Nitrite NEGATIVE NEGATIVE   Leukocytes,Ua NEGATIVE NEGATIVE   RBC / HPF 0-5 0 - 5 RBC/hpf   WBC, UA 0-5 0 - 5 WBC/hpf   Bacteria, UA RARE (A) NONE SEEN   Squamous Epithelial / LPF 0-5 0 - 5   Mucus PRESENT   CBC with Differential     Status: Abnormal   Collection Time: 07/15/21  6:09 PM  Result Value Ref Range   WBC 13.1 (H) 4.0 - 10.5 K/uL   RBC 4.73 3.87 - 5.11 MIL/uL   Hemoglobin 13.6 12.0 - 15.0 g/dL   HCT 43.3 36.0 - 46.0 %   MCV 91.5 80.0 - 100.0 fL   MCH 28.8 26.0 - 34.0 pg   MCHC 31.4 30.0 - 36.0 g/dL   RDW 13.1 11.5 - 15.5 %   Platelets 262 150 - 400 K/uL   nRBC 0.0 0.0 - 0.2 %   Neutrophils Relative % 77 %   Neutro Abs 10.0 (H) 1.7 - 7.7 K/uL   Lymphocytes Relative 17 %   Lymphs Abs 2.2 0.7 - 4.0 K/uL   Monocytes Relative 6 %   Monocytes Absolute 0.8 0.1 - 1.0 K/uL   Eosinophils Relative 0 %   Eosinophils Absolute 0.1 0.0 - 0.5 K/uL   Basophils Relative 0 %   Basophils Absolute 0.0 0.0 - 0.1 K/uL   Immature Granulocytes 0 %   Abs Immature Granulocytes 0.04 0.00 - 0.07 K/uL  Comprehensive metabolic panel     Status: Abnormal   Collection Time: 07/15/21  6:09 PM  Result Value Ref Range   Sodium 144 135 - 145 mmol/L   Potassium 3.6 3.5 - 5.1 mmol/L   Chloride 105 98 - 111 mmol/L   CO2 29 22 - 32 mmol/L   Glucose, Bld 116 (H) 70 - 99 mg/dL   BUN 12 8 - 23 mg/dL   Creatinine, Ser 1.23 (H) 0.44 - 1.00 mg/dL   Calcium 10.2 8.9 - 10.3 mg/dL   Total Protein 8.6 (H) 6.5 - 8.1 g/dL   Albumin 4.3 3.5 - 5.0 g/dL   AST 18 15 - 41 U/L   ALT 14 0 - 44 U/L   Alkaline Phosphatase 73 38 - 126 U/L   Total Bilirubin 0.7 0.3 - 1.2 mg/dL   GFR, Estimated 50 (L) >60 mL/min   Anion gap 10 5 - 15  Lipase, blood     Status: None   Collection Time:  07/15/21  6:09 PM  Result Value Ref Range   Lipase 37 11 - 51 U/L     Imaging Orders         US Abdomen Limited     Cholelithiasis with mild gallbladder wall thickening and sludge within gallbladder.   No definite pericholecystic fluid or sonographic Percell Miller sign are identified but early acute cholecystitis not  completely excluded.   No definite evidence of biliary obstruction.    Assessment and Plan   Tara Holland is an 60 y.o. female with abdominal pain found to have cholecystitis.    I recommended laparoscopic cholecystectomy for the patient.  The procedure itself as well as its risks, benefits and alternatives were discussed.  The patient granted consent to proceed.  We will add on for the surgery in the morning.  Felicie Morn, MD  [facility] Use [email or web address] to contact on call provider

## 2021-07-16 ENCOUNTER — Encounter (HOSPITAL_COMMUNITY): Payer: Self-pay

## 2021-07-16 ENCOUNTER — Encounter (HOSPITAL_COMMUNITY)
Admission: EM | Disposition: A | Discharge status: Home / Self Care | Payer: Self-pay | Source: Home / Self Care | Attending: Emergency Medicine

## 2021-07-16 ENCOUNTER — Inpatient Hospital Stay (HOSPITAL_COMMUNITY): Payer: 59 | Admitting: Certified Registered Nurse Anesthetist

## 2021-07-16 DIAGNOSIS — K8000 Calculus of gallbladder with acute cholecystitis without obstruction: Secondary | ICD-10-CM | POA: Diagnosis not present

## 2021-07-16 DIAGNOSIS — E119 Type 2 diabetes mellitus without complications: Secondary | ICD-10-CM | POA: Diagnosis not present

## 2021-07-16 DIAGNOSIS — I10 Essential (primary) hypertension: Secondary | ICD-10-CM | POA: Diagnosis not present

## 2021-07-16 DIAGNOSIS — Z20822 Contact with and (suspected) exposure to covid-19: Secondary | ICD-10-CM | POA: Diagnosis not present

## 2021-07-16 HISTORY — PX: CHOLECYSTECTOMY: SHX55

## 2021-07-16 LAB — CBC
HCT: 40.4 % (ref 36.0–46.0)
Hemoglobin: 13 g/dL (ref 12.0–15.0)
MCH: 29 pg (ref 26.0–34.0)
MCHC: 32.2 g/dL (ref 30.0–36.0)
MCV: 90 fL (ref 80.0–100.0)
Platelets: 248 10*3/uL (ref 150–400)
RBC: 4.49 MIL/uL (ref 3.87–5.11)
RDW: 13.2 % (ref 11.5–15.5)
WBC: 12.8 10*3/uL — ABNORMAL HIGH (ref 4.0–10.5)
nRBC: 0 % (ref 0.0–0.2)

## 2021-07-16 LAB — HEPATIC FUNCTION PANEL
ALT: 14 U/L (ref 0–44)
AST: 16 U/L (ref 15–41)
Albumin: 3.7 g/dL (ref 3.5–5.0)
Alkaline Phosphatase: 65 U/L (ref 38–126)
Bilirubin, Direct: 0.1 mg/dL (ref 0.0–0.2)
Indirect Bilirubin: 0.5 mg/dL (ref 0.3–0.9)
Total Bilirubin: 0.6 mg/dL (ref 0.3–1.2)
Total Protein: 7.7 g/dL (ref 6.5–8.1)

## 2021-07-16 LAB — BASIC METABOLIC PANEL
Anion gap: 11 (ref 5–15)
BUN: 12 mg/dL (ref 8–23)
CO2: 26 mmol/L (ref 22–32)
Calcium: 9.5 mg/dL (ref 8.9–10.3)
Chloride: 105 mmol/L (ref 98–111)
Creatinine, Ser: 1.27 mg/dL — ABNORMAL HIGH (ref 0.44–1.00)
GFR, Estimated: 48 mL/min — ABNORMAL LOW (ref >60)
Glucose, Bld: 139 mg/dL — ABNORMAL HIGH (ref 70–99)
Potassium: 3.5 mmol/L (ref 3.5–5.1)
Sodium: 142 mmol/L (ref 135–145)

## 2021-07-16 LAB — RESP PANEL BY RT-PCR (FLU A&B, COVID) ARPGX2
Influenza A by PCR: NEGATIVE
Influenza B by PCR: NEGATIVE
SARS Coronavirus 2 by RT PCR: NEGATIVE

## 2021-07-16 LAB — HIV ANTIBODY (ROUTINE TESTING W REFLEX): HIV Screen 4th Generation wRfx: NONREACTIVE

## 2021-07-16 SURGERY — LAPAROSCOPIC CHOLECYSTECTOMY WITH INTRAOPERATIVE CHOLANGIOGRAM
Anesthesia: General | Site: Abdomen

## 2021-07-16 MED ORDER — LIDOCAINE 2% (20 MG/ML) 5 ML SYRINGE
INTRAMUSCULAR | Status: DC | PRN
  Administered 2021-07-16: 100 mg via INTRAVENOUS

## 2021-07-16 MED ORDER — BUPIVACAINE-EPINEPHRINE 0.25% -1:200000 IJ SOLN
INTRAMUSCULAR | Status: DC | PRN
  Administered 2021-07-16: 30 mL

## 2021-07-16 MED ORDER — 0.9 % SODIUM CHLORIDE (POUR BTL) OPTIME
TOPICAL | Status: DC | PRN
  Administered 2021-07-16: 1000 mL

## 2021-07-16 MED ORDER — FENTANYL CITRATE (PF) 250 MCG/5ML IJ SOLN
INTRAMUSCULAR | Status: DC | PRN
  Administered 2021-07-16: 25 ug via INTRAVENOUS
  Administered 2021-07-16: 50 ug via INTRAVENOUS
  Administered 2021-07-16: 25 ug via INTRAVENOUS

## 2021-07-16 MED ORDER — SUGAMMADEX SODIUM 200 MG/2ML IV SOLN
INTRAVENOUS | Status: DC | PRN
  Administered 2021-07-16: 200 mg via INTRAVENOUS

## 2021-07-16 MED ORDER — HYDROMORPHONE HCL 1 MG/ML IJ SOLN
INTRAMUSCULAR | Status: AC
  Filled 2021-07-16: qty 1

## 2021-07-16 MED ORDER — DEXAMETHASONE SODIUM PHOSPHATE 10 MG/ML IJ SOLN
INTRAMUSCULAR | Status: DC | PRN
  Administered 2021-07-16: 10 mg via INTRAVENOUS

## 2021-07-16 MED ORDER — OXYCODONE HCL 5 MG PO TABS: 5.0000 mg | ORAL_TABLET | Freq: Once | ORAL | Status: DC | PRN

## 2021-07-16 MED ORDER — ONDANSETRON HCL 4 MG/2ML IJ SOLN: 4.0000 mg | Freq: Once | INTRAMUSCULAR | Status: DC | PRN

## 2021-07-16 MED ORDER — CHLORHEXIDINE GLUCONATE CLOTH 2 % EX PADS
6.0000 | MEDICATED_PAD | Freq: Once | CUTANEOUS | Status: AC
  Administered 2021-07-16: 6 via TOPICAL

## 2021-07-16 MED ORDER — PROPOFOL 10 MG/ML IV BOLUS
INTRAVENOUS | Status: AC
  Filled 2021-07-16: qty 20

## 2021-07-16 MED ORDER — PROPOFOL 10 MG/ML IV BOLUS
INTRAVENOUS | Status: DC | PRN
  Administered 2021-07-16: 140 mg via INTRAVENOUS

## 2021-07-16 MED ORDER — FENTANYL CITRATE (PF) 100 MCG/2ML IJ SOLN
INTRAMUSCULAR | Status: AC
  Filled 2021-07-16: qty 2

## 2021-07-16 MED ORDER — DEXAMETHASONE SODIUM PHOSPHATE 10 MG/ML IJ SOLN
INTRAMUSCULAR | Status: AC
  Filled 2021-07-16: qty 1

## 2021-07-16 MED ORDER — CEFAZOLIN SODIUM-DEXTROSE 2-4 GM/100ML-% IV SOLN: 2.0000 g | INTRAVENOUS | Status: DC

## 2021-07-16 MED ORDER — LACTATED RINGERS IR SOLN
Status: DC | PRN
  Administered 2021-07-16: 1000 mL

## 2021-07-16 MED ORDER — KETOROLAC TROMETHAMINE 30 MG/ML IJ SOLN
INTRAMUSCULAR | Status: AC
  Filled 2021-07-16: qty 1

## 2021-07-16 MED ORDER — ROCURONIUM BROMIDE 10 MG/ML (PF) SYRINGE
PREFILLED_SYRINGE | INTRAVENOUS | Status: AC
  Filled 2021-07-16: qty 10

## 2021-07-16 MED ORDER — OXYCODONE HCL 5 MG/5ML PO SOLN: 5.0000 mg | Freq: Once | ORAL | Status: DC | PRN

## 2021-07-16 MED ORDER — KETOROLAC TROMETHAMINE 30 MG/ML IJ SOLN
30.0000 mg | Freq: Once | INTRAMUSCULAR | Status: AC | PRN
  Administered 2021-07-16: 30 mg via INTRAVENOUS

## 2021-07-16 MED ORDER — ONDANSETRON HCL 4 MG/2ML IJ SOLN
INTRAMUSCULAR | Status: AC
  Filled 2021-07-16: qty 2

## 2021-07-16 MED ORDER — BUPIVACAINE-EPINEPHRINE (PF) 0.25% -1:200000 IJ SOLN
INTRAMUSCULAR | Status: AC
  Filled 2021-07-16: qty 30

## 2021-07-16 MED ORDER — ROCURONIUM BROMIDE 10 MG/ML (PF) SYRINGE
PREFILLED_SYRINGE | INTRAVENOUS | Status: DC | PRN
  Administered 2021-07-16: 60 mg via INTRAVENOUS

## 2021-07-16 MED ORDER — PHENYLEPHRINE 40 MCG/ML (10ML) SYRINGE FOR IV PUSH (FOR BLOOD PRESSURE SUPPORT)
PREFILLED_SYRINGE | INTRAVENOUS | Status: AC
  Filled 2021-07-16: qty 10

## 2021-07-16 MED ORDER — MIDAZOLAM HCL 5 MG/5ML IJ SOLN
INTRAMUSCULAR | Status: DC | PRN
  Administered 2021-07-16: 2 mg via INTRAVENOUS

## 2021-07-16 MED ORDER — HYDROMORPHONE HCL 1 MG/ML IJ SOLN
0.2500 mg | INTRAMUSCULAR | Status: DC | PRN
  Administered 2021-07-16 (×3): 0.5 mg via INTRAVENOUS

## 2021-07-16 MED ORDER — MIDAZOLAM HCL 2 MG/2ML IJ SOLN
INTRAMUSCULAR | Status: AC
  Filled 2021-07-16: qty 4

## 2021-07-16 MED ORDER — LIDOCAINE HCL (PF) 2 % IJ SOLN
INTRAMUSCULAR | Status: AC
  Filled 2021-07-16: qty 5

## 2021-07-16 MED ORDER — PHENYLEPHRINE 40 MCG/ML (10ML) SYRINGE FOR IV PUSH (FOR BLOOD PRESSURE SUPPORT)
PREFILLED_SYRINGE | INTRAVENOUS | Status: DC | PRN
  Administered 2021-07-16: 80 ug via INTRAVENOUS

## 2021-07-16 MED ORDER — ONDANSETRON HCL 4 MG/2ML IJ SOLN
INTRAMUSCULAR | Status: DC | PRN
  Administered 2021-07-16: 4 mg via INTRAVENOUS

## 2021-07-16 SURGICAL SUPPLY — 43 items
ADH SKN CLS APL DERMABOND .7 (GAUZE/BANDAGES/DRESSINGS) ×1
APL PRP STRL LF DISP 70% ISPRP (MISCELLANEOUS) ×1
APPLIER CLIP ROT 10 11.4 M/L (STAPLE) ×2
APR CLP MED LRG 11.4X10 (STAPLE) ×1
BAG COUNTER SPONGE SURGICOUNT (BAG) IMPLANT
BAG SPEC RTRVL LRG 6X4 10 (ENDOMECHANICALS) ×1
BAG SPNG CNTER NS LX DISP (BAG)
CABLE HIGH FREQUENCY MONO STRZ (ELECTRODE) ×2 IMPLANT
CHLORAPREP W/TINT 26 (MISCELLANEOUS) ×2 IMPLANT
CLIP APPLIE ROT 10 11.4 M/L (STAPLE) ×1 IMPLANT
COVER MAYO STAND STRL (DRAPES) IMPLANT
COVER SURGICAL LIGHT HANDLE (MISCELLANEOUS) ×4 IMPLANT
DECANTER SPIKE VIAL GLASS SM (MISCELLANEOUS) ×2 IMPLANT
DERMABOND ADVANCED (GAUZE/BANDAGES/DRESSINGS) ×1
DERMABOND ADVANCED .7 DNX12 (GAUZE/BANDAGES/DRESSINGS) ×1 IMPLANT
DRAPE C-ARM 42X120 X-RAY (DRAPES) IMPLANT
ELECT REM PT RETURN 15FT ADLT (MISCELLANEOUS) ×2 IMPLANT
ENDOLOOP SUT PDS II  0 18 (SUTURE) ×4
ENDOLOOP SUT PDS II 0 18 (SUTURE) ×2 IMPLANT
GLOVE SURG ENC MOIS LTX SZ6 (GLOVE) ×2 IMPLANT
GLOVE SURG MICRO LTX SZ6 (GLOVE) ×2 IMPLANT
GLOVE SURG UNDER LTX SZ6.5 (GLOVE) ×2 IMPLANT
GOWN STRL REUS W/TWL LRG LVL3 (GOWN DISPOSABLE) ×2 IMPLANT
GOWN STRL REUS W/TWL XL LVL3 (GOWN DISPOSABLE) ×4 IMPLANT
GRASPER SUT TROCAR 14GX15 (MISCELLANEOUS) ×2 IMPLANT
HEMOSTAT SNOW SURGICEL 2X4 (HEMOSTASIS) IMPLANT
IRRIG SUCT STRYKERFLOW 2 WTIP (MISCELLANEOUS) ×2
IRRIGATION SUCT STRKRFLW 2 WTP (MISCELLANEOUS) ×1 IMPLANT
KIT BASIN OR (CUSTOM PROCEDURE TRAY) ×2 IMPLANT
KIT TURNOVER KIT A (KITS) ×2 IMPLANT
NEEDLE INSUFFLATION 14GA 120MM (NEEDLE) ×2 IMPLANT
PENCIL SMOKE EVACUATOR (MISCELLANEOUS) IMPLANT
POUCH SPECIMEN RETRIEVAL 10MM (ENDOMECHANICALS) ×2 IMPLANT
SCISSORS LAP 5X35 DISP (ENDOMECHANICALS) ×2 IMPLANT
SET CHOLANGIOGRAPH MIX (MISCELLANEOUS) IMPLANT
SET TUBE SMOKE EVAC HIGH FLOW (TUBING) ×2 IMPLANT
SLEEVE XCEL OPT CAN 5 100 (ENDOMECHANICALS) ×4 IMPLANT
SUT MNCRL AB 4-0 PS2 18 (SUTURE) ×2 IMPLANT
TOWEL OR 17X26 10 PK STRL BLUE (TOWEL DISPOSABLE) ×2 IMPLANT
TOWEL OR NON WOVEN STRL DISP B (DISPOSABLE) IMPLANT
TRAY LAPAROSCOPIC (CUSTOM PROCEDURE TRAY) ×2 IMPLANT
TROCAR BLADELESS OPT 5 100 (ENDOMECHANICALS) ×2 IMPLANT
TROCAR XCEL 12X100 BLDLESS (ENDOMECHANICALS) ×2 IMPLANT

## 2021-07-16 NOTE — Anesthesia Preprocedure Evaluation (Signed)
Anesthesia Evaluation  Patient identified by MRN, date of birth, ID band Patient awake    Reviewed: Allergy & Precautions, NPO status , Patient's Chart, lab work & pertinent test results  Airway Mallampati: II  TM Distance: >3 FB Neck ROM: Full    Dental no notable dental hx.    Pulmonary neg pulmonary ROS,    Pulmonary exam normal breath sounds clear to auscultation       Cardiovascular hypertension, Normal cardiovascular exam Rhythm:Regular Rate:Normal     Neuro/Psych negative neurological ROS  negative psych ROS   GI/Hepatic Neg liver ROS, GERD  ,  Endo/Other  diabetes  Renal/GU negative Renal ROS  negative genitourinary   Musculoskeletal negative musculoskeletal ROS (+)   Abdominal   Peds negative pediatric ROS (+)  Hematology negative hematology ROS (+)   Anesthesia Other Findings   Reproductive/Obstetrics negative OB ROS                             Anesthesia Physical Anesthesia Plan  ASA: 2 and emergent  Anesthesia Plan: General   Post-op Pain Management:    Induction: Intravenous  PONV Risk Score and Plan: 3 and Ondansetron, Dexamethasone and Treatment may vary due to age or medical condition  Airway Management Planned: Oral ETT  Additional Equipment:   Intra-op Plan:   Post-operative Plan: Extubation in OR  Informed Consent: I have reviewed the patients History and Physical, chart, labs and discussed the procedure including the risks, benefits and alternatives for the proposed anesthesia with the patient or authorized representative who has indicated his/her understanding and acceptance.     Dental advisory given  Plan Discussed with: CRNA and Surgeon  Anesthesia Plan Comments:         Anesthesia Quick Evaluation

## 2021-07-16 NOTE — ED Provider Notes (Signed)
[facility] Provider Note   CSN: [id number] Arrival date & time: 07/15/21  1355     History Chief Complaint  Patient presents with   Abdominal Pain   Emesis   Back Pain    Tara Holland is a 60 y.o. female.  Patient presents to the ER chief complaint of persistent right upper quadrant pain.  Describes a sharp and aching she did have been off and on for the past several days.  She was seen here several days ago and diagnosed with gall stones but she declined surgical intervention and left the hospital.  Returns today stating the pain was worse since about 4:00 earlier today and has been persistent for several hours now.  Denies fevers or cough.  Denies vomiting or diarrhea.      Past Medical History:  Diagnosis Date   GERD (gastroesophageal reflux disease)    Hypertension    MMT (medial meniscus tear)    right knee   Pain in joint, hand    Pre-diabetes    Pure hypercholesterolemia    Symptomatic mammary hypertrophy    Vaginal irritation     Patient Active Problem List   Diagnosis Date Noted   Acute cholecystitis 07/15/2021   Symptomatic mammary hypertrophy 02/12/2019   Neck pain 02/12/2019   DM (diabetes mellitus), type 2 ([place]) 11/28/2017   Facial droop 11/27/2017   Malignant hypertension 11/27/2017   Hyperglycemia 11/27/2017   Abdominal pain 05/24/2016   Spasm of vocal cords 11/11/2015   Back pain 04/21/2009   VENTRICULAR HYPERTROPHY, LEFT 02/17/2009   OBESITY 02/02/2009   Essential hypertension 01/04/2009   HYPERCHOLESTEROLEMIA 02/06/2008    Past Surgical History:  Procedure Laterality Date   BREAST LUMPECTOMY     BREAST REDUCTION SURGERY Bilateral 04/23/2019   Procedure: MAMMARY REDUCTION  (BREAST);  Surgeon: Wallace Going, DO;  Location: [place];  Service: Plastics;  Laterality: Bilateral;   VAGINAL HYSTERECTOMY       OB History   No obstetric history on file.     Family History  Problem Relation Age  of Onset   Hypertension Mother    Kidney disease Mother    CVA Mother 2   Kidney disease Brother     Social History   Tobacco Use   Smoking status: Never   Smokeless tobacco: Never  Vaping Use   Vaping Use: Never used  Substance Use Topics   Alcohol use: No   Drug use: No    Home Medications Prior to Admission medications   Medication Sig Start Date End Date Taking? Authorizing Provider  amLODipine-benazepril (LOTREL) 5-20 MG capsule Take by mouth. 04/06/19  Yes [provider]  famotidine (PEPCID) 20 MG tablet Take 1 tablet (20 mg total) by mouth 2 (two) times daily. 07/09/21  Yes Carlisle Cater, PA-C  pantoprazole (PROTONIX) 20 MG tablet Take 1 tablet (20 mg total) by mouth daily. 07/09/21  Yes Carlisle Cater, PA-C  sucralfate (CARAFATE) 1 g tablet Take 1 tablet (1 g total) by mouth 4 (four) times daily -  with meals and at bedtime. 07/09/21  Yes Carlisle Cater, PA-C  cholecalciferol (VITAMIN D3) 25 MCG (1000 UNIT) tablet Take 1,000 Units by mouth daily.    [provider]  Multiple Vitamin (MULTI-VITAMIN DAILY PO) Multi Vitamin  daily    [provider]    Allergies    Sulfonamide derivatives  Review of Systems   Review of Systems  Constitutional:  Negative for fever.  HENT:  Negative for ear pain.   Eyes:  Negative for pain.  Respiratory:  Negative for cough.   Cardiovascular:  Negative for chest pain.  Gastrointestinal:  Positive for abdominal pain.  Genitourinary:  Negative for flank pain.  Musculoskeletal:  Negative for back pain.  Skin:  Negative for rash.  Neurological:  Negative for headaches.   Physical Exam Updated Vital Signs BP 113/67 (BP Location: Left Arm)   Pulse (!) 52   Temp 97.9 F (36.6 C) (Oral)   Resp 16   Ht 5\' 10"  (1.778 m)   Wt 87.3 kg   SpO2 100%   BMI 27.62 kg/m   Physical Exam Constitutional:      General: She is not in acute distress.    Appearance: Normal appearance.  HENT:     Head: Normocephalic.      Nose: Nose normal.  Eyes:     Extraocular Movements: Extraocular movements intact.  Cardiovascular:     Rate and Rhythm: Normal rate.  Pulmonary:     Effort: Pulmonary effort is normal.  Abdominal:     Tenderness: There is abdominal tenderness in the right upper quadrant. There is guarding and rebound.  Musculoskeletal:        General: Normal range of motion.     Cervical back: Normal range of motion.  Neurological:     General: No focal deficit present.     Mental Status: She is alert. Mental status is at baseline.    ED Results / Procedures / Treatments   Labs (all labs ordered are listed, but only abnormal results are displayed) Labs Reviewed  CBC WITH DIFFERENTIAL/PLATELET - Abnormal; Notable for the following components:      Result Value   WBC 13.1 (*)    Neutro Abs 10.0 (*)    All other components within normal limits  COMPREHENSIVE METABOLIC PANEL - Abnormal; Notable for the following components:   Glucose, Bld 116 (*)    Creatinine, Ser 1.23 (*)    Total Protein 8.6 (*)    GFR, Estimated 50 (*)    All other components within normal limits  URINALYSIS, ROUTINE W REFLEX MICROSCOPIC - Abnormal; Notable for the following components:   Glucose, UA 50 (*)    Protein, ur 30 (*)    Bacteria, UA RARE (*)    All other components within normal limits  BASIC METABOLIC PANEL - Abnormal; Notable for the following components:   Glucose, Bld 139 (*)    Creatinine, Ser 1.27 (*)    GFR, Estimated 48 (*)    All other components within normal limits  CBC - Abnormal; Notable for the following components:   WBC 12.8 (*)    All other components within normal limits  RESP PANEL BY RT-PCR (FLU A&B, COVID) ARPGX2  LIPASE, BLOOD  HIV ANTIBODY (ROUTINE TESTING W REFLEX)  HEPATIC FUNCTION PANEL  SURGICAL PATHOLOGY    EKG None  Radiology US Abdomen Limited  Result Date: 07/15/2021 CLINICAL DATA:  RIGHT upper quadrant abdominal pain since this morning, history GERD,  hypertension EXAM: ULTRASOUND ABDOMEN LIMITED RIGHT UPPER QUADRANT COMPARISON:  07/08/2021 FINDINGS: Gallbladder: Shadowing calculus in gallbladder 16 mm diameter. Mild gallbladder wall thickening. Small amount of sludge in gallbladder. No pericholecystic fluid or sonographic Murphy sign. Common bile duct: Diameter: 6 mm, upper normal Liver: Upper normal echogenicity. No mass or nodularity. No intrahepatic biliary dilatation. Portal vein is patent on color Doppler imaging with normal direction of blood flow towards the liver. Other: No RIGHT upper quadrant  free fluid. IMPRESSION: Cholelithiasis with mild gallbladder wall thickening and sludge within gallbladder. No definite pericholecystic fluid or sonographic Percell Miller sign are identified but early acute cholecystitis not completely excluded. No definite evidence of biliary obstruction. Electronically Signed   By: Lavonia Dana M.D.   On: 07/15/2021 15:28    Procedures Procedures   Medications Ordered in ED Medications  enoxaparin (LOVENOX) injection 40 mg ( Subcutaneous MAR Unhold 07/16/21 1026)  lactated ringers infusion ( Intravenous New Bag/Given 07/16/21 0947)  cefTRIAXone (ROCEPHIN) 2 g in sodium chloride 0.9 % 100 mL IVPB ( Intravenous MAR Unhold 07/16/21 1026)  acetaminophen (TYLENOL) tablet 650 mg (650 mg Oral Given 07/16/21 1222)  oxyCODONE (Oxy IR/ROXICODONE) immediate release tablet 5 mg ( Oral MAR Unhold 07/16/21 1026)  oxyCODONE (Oxy IR/ROXICODONE) immediate release tablet 10 mg ( Oral MAR Unhold 07/16/21 1026)  HYDROmorphone (DILAUDID) injection 0.5 mg ( Intravenous MAR Unhold 07/16/21 1026)  methocarbamol (ROBAXIN) 500 mg in dextrose 5 % 50 mL IVPB ( Intravenous MAR Unhold 07/16/21 1026)  ondansetron (ZOFRAN) injection 4 mg ( Intravenous MAR Unhold 07/16/21 1026)  prochlorperazine (COMPAZINE) injection 10 mg ( Intravenous MAR Unhold 07/16/21 1026)  simethicone (MYLICON) chewable tablet 80 mg ( Oral MAR Unhold 07/16/21 1026)  docusate sodium  (COLACE) capsule 100 mg ( Oral MAR Unhold 07/16/21 1026)  ketorolac (TORADOL) 30 MG/ML injection (has no administration in time range)  HYDROmorphone (DILAUDID) 1 MG/ML injection (has no administration in time range)  HYDROmorphone (DILAUDID) 1 MG/ML injection (has no administration in time range)  Chlorhexidine Gluconate Cloth 2 % PADS 6 each (6 each Topical Given 07/16/21 0600)  ketorolac (TORADOL) 30 MG/ML injection 30 mg (30 mg Intravenous Given 07/16/21 0998)    ED Course  I have reviewed the triage vital signs and the nursing notes.  Pertinent labs & imaging results that were available during my care of the patient were reviewed by me and considered in my medical decision making (see chart for details).    MDM Rules/Calculators/A&P                           Patient has mildly elevated temperature and elevated white count is present.  Liver enzymes are normal.  Ultrasound is equivocal with evidence of gallstones and possible early acute cholecystitis per radiologist.  Case discussed with surgery who will see the patient.  Final Clinical Impression(s) / ED Diagnoses Final diagnoses:  Gallbladder disease    Rx / DC Orders ED Discharge Orders     None        Luna Fuse, MD 07/16/21 1534

## 2021-07-16 NOTE — Progress Notes (Signed)
Patient reassessed this afternoon, she feels wonderful, has no pain and is eating her dinner without any nausea or discomfort. Afebrile, no tachycardia, normotensive, sats 100% on room air. Abdomen is soft, nontender, incisions clean dry intact with no hematoma or cellulitis She would like to stay this evening as tomorrow she will have more help to get herself in her car home.  Anticipate discharge tomorrow.

## 2021-07-16 NOTE — Anesthesia Postprocedure Evaluation (Signed)
Anesthesia Post Note  Patient: Tara Holland  Procedure(s) Performed: LAPAROSCOPIC CHOLECYSTECTOMY WITH INTRAOPERATIVE CHOLANGIOGRAM (Abdomen)     Patient location during evaluation: PACU Anesthesia Type: General Level of consciousness: awake and alert Pain management: pain level controlled Vital Signs Assessment: post-procedure vital signs reviewed and stable Respiratory status: spontaneous breathing, nonlabored ventilation, respiratory function stable and patient connected to nasal cannula oxygen Cardiovascular status: blood pressure returned to baseline and stable Postop Assessment: no apparent nausea or vomiting Anesthetic complications: no   No notable events documented.  Last Vitals:  Vitals:   07/16/21 0909 07/16/21 0915  BP: (!) 158/99 (!) 153/92  Pulse: 77 76  Resp: 15 14  Temp: 37.3 C   SpO2: 100% 100%    Last Pain:  Vitals:   07/16/21 0915  TempSrc:   PainSc: Asleep                 Vernell Townley S

## 2021-07-16 NOTE — Plan of Care (Signed)
  Problem: Clinical Measurements: Goal: Ability to maintain clinical measurements within normal limits will improve Outcome: Progressing   Problem: Clinical Measurements: Goal: Will remain free from infection Outcome: Progressing   Problem: Activity: Goal: Risk for activity intolerance will decrease Outcome: Progressing

## 2021-07-16 NOTE — Anesthesia Procedure Notes (Signed)
Procedure Name: Intubation Date/Time: 07/16/2021 7:40 AM Performed by: Emberli Ballester D, CRNA Pre-anesthesia Checklist: Patient identified, Emergency Drugs available, Suction available and Patient being monitored Patient Re-evaluated:Patient Re-evaluated prior to induction Oxygen Delivery Method: Circle system utilized Preoxygenation: Pre-oxygenation with 100% oxygen Induction Type: IV induction Ventilation: Mask ventilation without difficulty Laryngoscope Size: Mac and 4 Tube type: Oral Number of attempts: 1 Airway Equipment and Method: Stylet Placement Confirmation: ETT inserted through vocal cords under direct vision, positive ETCO2 and breath sounds checked- equal and bilateral Secured at: 21 cm Tube secured with: Tape Dental Injury: Teeth and Oropharynx as per pre-operative assessment

## 2021-07-16 NOTE — Interval H&P Note (Signed)
History and Physical Interval Note:  07/16/2021 7:01 AM  Tara Holland  has presented today for surgery, with the diagnosis of choleccystitis.  The various methods of treatment have been discussed with the patient and family. After consideration of risks, benefits and other options for treatment, the patient has consented to  Procedure(s): LAPAROSCOPIC CHOLECYSTECTOMY WITH INTRAOPERATIVE CHOLANGIOGRAM (N/A) as a surgical intervention.  The patient's history has been reviewed, patient examined, no change in status, stable for surgery.  I have reviewed the patient's chart and labs.  Questions were answered to the patient's satisfaction.     [name]

## 2021-07-16 NOTE — Op Note (Signed)
Operative Note  Tara Holland 60 y.o. female [id number]  07/16/2021  Surgeon: Clovis Riley MD FACS  Procedure performed: Laparoscopic Cholecystectomy  Procedure classification: emergent  Preop diagnosis: cholecystitis Post-op diagnosis/intraop findings: same  Specimens: gallbladder  Retained items: none  EBL: minimal  Complications: none  Description of procedure: After obtaining informed consent the patient was brought to the operating room. Antibiotics were administered. SCD's were applied. General endotracheal anesthesia was initiated and a formal time-out was performed. The abdomen was prepped and draped in the usual sterile fashion and the abdomen was entered using  left subcostal veress needle  after instilling the site with local. Insufflation to 73mmHg was obtained, right subcostal Optiview 57mm trocar and camera inserted, and gross inspection revealed no evidence of injury from our entry or other intraabdominal abnormalities. Two 29mm trocars were introduced in the infraumbilical and right anterior axillary lines under direct visualization and following infiltration with local. An 56mm trocar was placed in the epigastrium.  Omental adhesions to the gallbladder were bluntly swept away revealing a erythematous, extremely thickened and tensely distended gallbladder.  This was decompressed with the [place] in order to allow to be grasped and retracted.  The gallbladder was retracted cephalad and the infundibulum was retracted laterally. A combination of hook electrocautery and blunt dissection was utilized to clear the peritoneum from the neck and cystic duct, circumferentially isolating the cystic artery and cystic duct and lifting the gallbladder from the cystic plate. The critical view of safety was achieved with the cystic artery, cystic duct, and liver bed visualized between them with no other structures.  The common bile duct and porta were vaguely visible as well and were well  away from the field of dissection.  The artery bifurcated and each branch was clipped with a single clip proximally and distally and divided as was the cystic duct with two clips on the proximal end.  The cystic duct was quite fibrotic and the clips did not go all the way across.  2-0 PDS Endoloops were placed and secured along the proximal cystic duct just below the clips. The gallbladder was dissected from the liver plate using electrocautery. Once freed the gallbladder was placed in an endocatch bag and removed through the epigastric trocar site. A small amount of bleeding on the liver bed was controlled with cautery. Some bile had been spilled from the gallbladder during its dissection from the liver bed. This was aspirated and the right upper quadrant was irrigated copiously until the effluent was clear. Hemostasis was once again confirmed, and reinspection of the abdomen revealed no injuries. The clips/Endoloops were well opposed without any bile leak from the duct or the liver bed. The 54mm trocar site in the epigastrium was closed with a 0 vicryl in the fascia under direct visualization using a PMI device. The abdomen was desufflated and all trocars removed. The skin incisions were closed with subcuticular monocryl and Dermabond. The patient was awakened, extubated and transported to the recovery room in stable condition.    All counts were correct at the completion of the case.

## 2021-07-16 NOTE — ED Notes (Signed)
ED TO INPATIENT HANDOFF REPORT  Name/Age/Gender Benna Dunks 60 y.o. female  Code Status    Code Status Orders  (From admission, onward)           Start     Ordered   07/15/21 2350  Full code  Continuous        07/15/21 2351           Code Status History     Date Active Date Inactive Code Status Order ID Comments User Context   11/27/2017 1704 11/28/2017 1543 Full Code [id number]  Karmen Bongo, MD Inpatient      Advance Directive Documentation    Flowsheet Row Most Recent Value  Type of Advance Directive Healthcare Power of Attorney, Living will  Pre-existing out of facility DNR order (yellow form or pink MOST form) --  "MOST" Form in Place? --       Home/SNF/Other Home  Chief Complaint Acute cholecystitis [K81.0]  Level of Care/Admitting Diagnosis ED Disposition     ED Disposition  Admit   Condition  --   [place]: [facility] [[id number]]  Level of Care: Med-Surg [16]  May admit patient to [facility] or [facility] if equivalent level of care is available:: No  Covid Evaluation: Asymptomatic Screening Protocol (No Symptoms)  Diagnosis: Acute cholecystitis [575.0.ICD-9-CM]  Admitting Physician: CCS, MD [[id number]]  Attending Physician: CCS, MD [[id number]]  Estimated length of stay: past midnight tomorrow  Certification:: I certify there are rare and unusual circumstances requiring inpatient admission          Medical History Past Medical History:  Diagnosis Date   GERD (gastroesophageal reflux disease)    Hypertension    MMT (medial meniscus tear)    right knee   Pain in joint, hand    Pre-diabetes    Pure hypercholesterolemia    Symptomatic mammary hypertrophy    Vaginal irritation     Allergies Allergies  Allergen Reactions   Sulfonamide Derivatives Itching    Severe itching    IV Location/Drains/Wounds Patient Lines/Drains/Airways Status     Active Line/Drains/Airways     Name Placement date  Placement time Site Days   Peripheral IV 07/16/21 20 G Right Antecubital 07/16/21  0006  Antecubital  less than 1            Labs/Imaging Results for orders placed or performed during the hospital encounter of 07/15/21 (from the past 62 hour(s))  Urinalysis, Routine w reflex microscopic Urine, Clean Catch     Status: Abnormal   Collection Time: 07/15/21  6:00 PM  Result Value Ref Range   Color, Urine YELLOW YELLOW   APPearance CLEAR CLEAR   Specific Gravity, Urine 1.010 1.005 - 1.030   pH 6.0 5.0 - 8.0   Glucose, UA 50 (A) NEGATIVE mg/dL   Hgb urine dipstick NEGATIVE NEGATIVE   Bilirubin Urine NEGATIVE NEGATIVE   Ketones, ur NEGATIVE NEGATIVE mg/dL   Protein, ur 30 (A) NEGATIVE mg/dL   Nitrite NEGATIVE NEGATIVE   Leukocytes,Ua NEGATIVE NEGATIVE   RBC / HPF 0-5 0 - 5 RBC/hpf   WBC, UA 0-5 0 - 5 WBC/hpf   Bacteria, UA RARE (A) NONE SEEN   Squamous Epithelial / LPF 0-5 0 - 5   Mucus PRESENT     Comment: Performed at [facility], [place] [address] [place], [place] [address]  CBC with Differential     Status: Abnormal   Collection Time: 07/15/21  6:09 PM  Result Value Ref Range   WBC 13.1 (H) 4.0 - 10.5 K/uL   RBC 4.73 3.87 - 5.11 MIL/uL   Hemoglobin 13.6 12.0 - 15.0 g/dL   HCT 43.3 36.0 - 46.0 %   MCV 91.5 80.0 - 100.0 fL   MCH 28.8 26.0 - 34.0 pg   MCHC 31.4 30.0 - 36.0 g/dL   RDW 13.1 11.5 - 15.5 %   Platelets 262 150 - 400 K/uL   nRBC 0.0 0.0 - 0.2 %   Neutrophils Relative % 77 %   Neutro Abs 10.0 (H) 1.7 - 7.7 K/uL   Lymphocytes Relative 17 %   Lymphs Abs 2.2 0.7 - 4.0 K/uL   Monocytes Relative 6 %   Monocytes Absolute 0.8 0.1 - 1.0 K/uL   Eosinophils Relative 0 %   Eosinophils Absolute 0.1 0.0 - 0.5 K/uL   Basophils Relative 0 %   Basophils Absolute 0.0 0.0 - 0.1 K/uL   Immature Granulocytes 0 %   Abs Immature Granulocytes 0.04 0.00 - 0.07 K/uL    Comment: Performed at [facility], [place] [address], [place], [place]  [address]  Comprehensive metabolic panel     Status: Abnormal   Collection Time: 07/15/21  6:09 PM  Result Value Ref Range   Sodium 144 135 - 145 mmol/L   Potassium 3.6 3.5 - 5.1 mmol/L   Chloride 105 98 - 111 mmol/L   CO2 29 22 - 32 mmol/L   Glucose, Bld 116 (H) 70 - 99 mg/dL    Comment: Glucose reference range applies only to samples taken after fasting for at least 8 hours.   BUN 12 8 - 23 mg/dL   Creatinine, Ser 1.23 (H) 0.44 - 1.00 mg/dL   Calcium 10.2 8.9 - 10.3 mg/dL   Total Protein 8.6 (H) 6.5 - 8.1 g/dL   Albumin 4.3 3.5 - 5.0 g/dL   AST 18 15 - 41 U/L   ALT 14 0 - 44 U/L   Alkaline Phosphatase 73 38 - 126 U/L   Total Bilirubin 0.7 0.3 - 1.2 mg/dL   GFR, Estimated 50 (L) >60 mL/min    Comment: (NOTE) Calculated using the CKD-EPI Creatinine Equation (2021)    Anion gap 10 5 - 15    Comment: Performed at [facility], [place] [address], [place], [place] [address]  Lipase, blood     Status: None   Collection Time: 07/15/21  6:09 PM  Result Value Ref Range   Lipase 37 11 - 51 U/L    Comment: Performed at [facility], [place] [address], [place], [place] [address]  Resp Panel by RT-PCR (Flu A&B, Covid) Nasopharyngeal Swab     Status: None   Collection Time: 07/16/21 12:46 AM   Specimen: Nasopharyngeal Swab; Nasopharyngeal(NP) swabs in vial transport medium  Result Value Ref Range   SARS Coronavirus 2 by RT PCR NEGATIVE NEGATIVE    Comment: (NOTE) SARS-CoV-2 target nucleic acids are NOT DETECTED.  The SARS-CoV-2 RNA is generally detectable in upper respiratory specimens during the acute phase of infection. The lowest concentration of SARS-CoV-2 viral copies this assay can detect is 138 copies/mL. A negative result does not preclude SARS-Cov-2 infection and should not be used as the sole basis for treatment or other patient management decisions. A negative result may occur with  improper specimen collection/handling, submission of specimen  other than nasopharyngeal swab, presence of viral mutation(s) within the areas targeted by this assay, and inadequate number of  viral copies(<138 copies/mL). A negative result must be combined with clinical observations, patient history, and epidemiological information. The expected result is Negative.  Fact Sheet for Patients:  EntrepreneurPulse.com.au  Fact Sheet for Healthcare Providers:  IncredibleEmployment.be  This test is no t yet approved or cleared by the [place] FDA and  has been authorized for detection and/or diagnosis of SARS-CoV-2 by FDA under an Emergency Use Authorization (EUA). This EUA will remain  in effect (meaning this test can be used) for the duration of the COVID-19 declaration under Section 564(b)(1) of the Act, 21 U.S.C.section 360bbb-3(b)(1), unless the authorization is terminated  or revoked sooner.       Influenza A by PCR NEGATIVE NEGATIVE   Influenza B by PCR NEGATIVE NEGATIVE    Comment: (NOTE) The Xpert Xpress SARS-CoV-2/FLU/RSV plus assay is intended as an aid in the diagnosis of influenza from Nasopharyngeal swab specimens and should not be used as a sole basis for treatment. Nasal washings and aspirates are unacceptable for Xpert Xpress SARS-CoV-2/FLU/RSV testing.  Fact Sheet for Patients: EntrepreneurPulse.com.au  Fact Sheet for Healthcare Providers: IncredibleEmployment.be  This test is not yet approved or cleared by the [place] FDA and has been authorized for detection and/or diagnosis of SARS-CoV-2 by FDA under an Emergency Use Authorization (EUA). This EUA will remain in effect (meaning this test can be used) for the duration of the COVID-19 declaration under Section 564(b)(1) of the Act, 21 U.S.C. section 360bbb-3(b)(1), unless the authorization is terminated or revoked.  Performed at [facility], [place] [address], [place], [place] [address]    US Abdomen Limited  Result Date: 07/15/2021 CLINICAL DATA:  RIGHT upper quadrant abdominal pain since this morning, history GERD, hypertension EXAM: ULTRASOUND ABDOMEN LIMITED RIGHT UPPER QUADRANT COMPARISON:  07/08/2021 FINDINGS: Gallbladder: Shadowing calculus in gallbladder 16 mm diameter. Mild gallbladder wall thickening. Small amount of sludge in gallbladder. No pericholecystic fluid or sonographic Murphy sign. Common bile duct: Diameter: 6 mm, upper normal Liver: Upper normal echogenicity. No mass or nodularity. No intrahepatic biliary dilatation. Portal vein is patent on color Doppler imaging with normal direction of blood flow towards the liver. Other: No RIGHT upper quadrant free fluid. IMPRESSION: Cholelithiasis with mild gallbladder wall thickening and sludge within gallbladder. No definite pericholecystic fluid or sonographic Percell Miller sign are identified but early acute cholecystitis not completely excluded. No definite evidence of biliary obstruction. Electronically Signed   By: Lavonia Dana M.D.   On: 07/15/2021 15:28    Pending Labs Unresulted Labs (From admission, onward)     Start     Ordered   07/22/21 0500  Creatinine, serum  (enoxaparin (LOVENOX)    CrCl >/= 30 ml/min)  Weekly,   R     Comments: while on enoxaparin therapy    07/15/21 2351   07/16/21 2440  Basic metabolic panel  Daily,   R      07/15/21 2351   07/16/21 0500  CBC  Daily,   R      07/15/21 2351   07/16/21 0500  Hepatic function panel  Daily,   R      07/15/21 2351   07/15/21 2350  HIV Antibody (routine testing w rflx)  (HIV Antibody (Routine testing w reflex) panel)  Once,   R        07/15/21 2351   07/15/21 2350  CBC  (enoxaparin (LOVENOX)    CrCl >/= 30 ml/min)  Once,   R       Comments: Baseline  for enoxaparin therapy IF NOT ALREADY DRAWN.  Notify MD if PLT < 100 K.    07/15/21 2351   07/15/21 2350  Creatinine, serum  (enoxaparin (LOVENOX)    CrCl >/= 30 ml/min)  Once,   R        Comments: Baseline for enoxaparin therapy IF NOT ALREADY DRAWN.    07/15/21 2351            Vitals/Pain Today's Vitals   07/15/21 2233 07/16/21 0256 07/16/21 0300 07/16/21 0330  BP: (!) 150/88 (!) 155/88 (!) 156/93 (!) 154/90  Pulse: 83 96 92 (!) 102  Resp: 18 16 16 20   Temp:      TempSrc:      SpO2: 99% 100% 98% 97%  Weight:      Height:      PainSc:  Asleep      Isolation Precautions No active isolations  Medications Medications  enoxaparin (LOVENOX) injection 40 mg (40 mg Subcutaneous Given 07/16/21 0046)  lactated ringers infusion (has no administration in time range)  cefTRIAXone (ROCEPHIN) 2 g in sodium chloride 0.9 % 100 mL IVPB (0 g Intravenous Stopped 07/16/21 0120)  acetaminophen (TYLENOL) tablet 650 mg (650 mg Oral Not Given 07/16/21 0057)  oxyCODONE (Oxy IR/ROXICODONE) immediate release tablet 5 mg (has no administration in time range)  oxyCODONE (Oxy IR/ROXICODONE) immediate release tablet 10 mg (has no administration in time range)  HYDROmorphone (DILAUDID) injection 0.5 mg (has no administration in time range)  methocarbamol (ROBAXIN) 500 mg in dextrose 5 % 50 mL IVPB (has no administration in time range)  ondansetron (ZOFRAN) injection 4 mg (has no administration in time range)  prochlorperazine (COMPAZINE) injection 10 mg (has no administration in time range)  simethicone (MYLICON) chewable tablet 80 mg (has no administration in time range)  docusate sodium (COLACE) capsule 100 mg (100 mg Oral Not Given 07/16/21 0058)    Mobility walks

## 2021-07-16 NOTE — Transfer of Care (Signed)
Immediate Anesthesia Transfer of Care Note  Patient: Tara Holland  Procedure(s) Performed: LAPAROSCOPIC CHOLECYSTECTOMY WITH INTRAOPERATIVE CHOLANGIOGRAM (Abdomen)  Patient Location: PACU  Anesthesia Type:General  Level of Consciousness: awake, alert  and oriented  Airway & Oxygen Therapy: Patient Spontanous Breathing and Patient connected to face mask oxygen  Post-op Assessment: Report given to RN and Post -op Vital signs reviewed and stable  Post vital signs: Reviewed and stable  Last Vitals:  Vitals Value Taken Time  BP 158/99 07/16/21 0909  Temp    Pulse 73 07/16/21 0911  Resp 14 07/16/21 0911  SpO2 99 % 07/16/21 0911  Vitals shown include unvalidated device data.  Last Pain:  Vitals:   07/16/21 0550  TempSrc:   PainSc: 6       Patients Stated Pain Goal: 3 (85/46/27 0350)  Complications: No notable events documented.

## 2021-07-17 ENCOUNTER — Encounter (HOSPITAL_COMMUNITY): Payer: Self-pay | Admitting: Surgery

## 2021-07-17 LAB — CBC
HCT: 34.9 % — ABNORMAL LOW (ref 36.0–46.0)
Hemoglobin: 11.3 g/dL — ABNORMAL LOW (ref 12.0–15.0)
MCH: 29.4 pg (ref 26.0–34.0)
MCHC: 32.4 g/dL (ref 30.0–36.0)
MCV: 90.6 fL (ref 80.0–100.0)
Platelets: 244 10*3/uL (ref 150–400)
RBC: 3.85 MIL/uL — ABNORMAL LOW (ref 3.87–5.11)
RDW: 13.1 % (ref 11.5–15.5)
WBC: 15.6 10*3/uL — ABNORMAL HIGH (ref 4.0–10.5)
nRBC: 0 % (ref 0.0–0.2)

## 2021-07-17 LAB — BASIC METABOLIC PANEL
Anion gap: 10 (ref 5–15)
BUN: 26 mg/dL — ABNORMAL HIGH (ref 8–23)
CO2: 27 mmol/L (ref 22–32)
Calcium: 9.6 mg/dL (ref 8.9–10.3)
Chloride: 107 mmol/L (ref 98–111)
Creatinine, Ser: 1.4 mg/dL — ABNORMAL HIGH (ref 0.44–1.00)
GFR, Estimated: 43 mL/min — ABNORMAL LOW (ref >60)
Glucose, Bld: 199 mg/dL — ABNORMAL HIGH (ref 70–99)
Potassium: 3.7 mmol/L (ref 3.5–5.1)
Sodium: 144 mmol/L (ref 135–145)

## 2021-07-17 MED ORDER — ACETAMINOPHEN 325 MG PO TABS: 650.0000 mg | ORAL_TABLET | Freq: Four times a day (QID) | ORAL | Status: DC | PRN

## 2021-07-17 MED ORDER — OXYCODONE HCL 5 MG PO TABS: 5.0000 mg | ORAL_TABLET | Freq: Four times a day (QID) | ORAL | 0 refills | Status: DC | PRN

## 2021-07-17 NOTE — Discharge Summary (Signed)
[facility] Discharge Summary   Patient ID: Tara Holland MRN: [id number] DOB/AGE: 1961-02-02 60 y.o.  Admit date: 07/15/2021 Discharge date: 07/17/2021  Admitting Diagnosis: Acute cholecystitis  Discharge Diagnosis Patient Active Problem List   Diagnosis Date Noted   Acute cholecystitis 07/15/2021   Symptomatic mammary hypertrophy 02/12/2019   Neck pain 02/12/2019   DM (diabetes mellitus), type 2 ([place]) 11/28/2017   Facial droop 11/27/2017   Malignant hypertension 11/27/2017   Hyperglycemia 11/27/2017   Abdominal pain 05/24/2016   Spasm of vocal cords 11/11/2015   Back pain 04/21/2009   VENTRICULAR HYPERTROPHY, LEFT 02/17/2009   OBESITY 02/02/2009   Essential hypertension 01/04/2009   HYPERCHOLESTEROLEMIA 02/06/2008    Consultants None  Imaging: US Abdomen Limited  Result Date: 07/15/2021 CLINICAL DATA:  RIGHT upper quadrant abdominal pain since this morning, history GERD, hypertension EXAM: ULTRASOUND ABDOMEN LIMITED RIGHT UPPER QUADRANT COMPARISON:  07/08/2021 FINDINGS: Gallbladder: Shadowing calculus in gallbladder 16 mm diameter. Mild gallbladder wall thickening. Small amount of sludge in gallbladder. No pericholecystic fluid or sonographic Murphy sign. Common bile duct: Diameter: 6 mm, upper normal Liver: Upper normal echogenicity. No mass or nodularity. No intrahepatic biliary dilatation. Portal vein is patent on color Doppler imaging with normal direction of blood flow towards the liver. Other: No RIGHT upper quadrant free fluid. IMPRESSION: Cholelithiasis with mild gallbladder wall thickening and sludge within gallbladder. No definite pericholecystic fluid or sonographic Percell Miller sign are identified but early acute cholecystitis not completely excluded. No definite evidence of biliary obstruction. Electronically Signed   By: Lavonia Dana M.D.   On: 07/15/2021 15:28    Procedures Dr. Kae Heller (07/16/2021) - Laparoscopic Cholecystectomy   Hospital Course:   Tara Holland is a 60yo female who presented to [facility] 10/8 with worsening abdominal pain.  Workup showed acute cholecystitis.  Patient was admitted and underwent procedure listed above.  Tolerated procedure well and was transferred to the floor.  Diet was advanced as tolerated.  On POD1, the patient was voiding well, tolerating diet, ambulating well, pain well controlled, vital signs stable, incisions c/d/i and felt stable for discharge home.  Patient will follow up as below and knows to call with questions or concerns.    I have personally reviewed the patients medication history on the [place] controlled substance database.    Physical Exam: General:  Alert, NAD, pleasant, comfortable Pulm: rate and effort normal Abd:  Soft, ND, appropriately tender, multiple lap incisions C/D/I  Allergies as of 07/17/2021       Reactions   Sulfonamide Derivatives Itching   Severe itching        Medication List     TAKE these medications    acetaminophen 325 MG tablet Commonly known as: TYLENOL Take 2 tablets (650 mg total) by mouth every 6 (six) hours as needed for mild pain.   amLODipine-benazepril 10-20 MG capsule Commonly known as: LOTREL Take 1 capsule by mouth daily.   famotidine 20 MG tablet Commonly known as: PEPCID Take 1 tablet (20 mg total) by mouth 2 (two) times daily.   MULTI-VITAMIN DAILY PO Take 15 mLs by mouth every morning.   oxyCODONE 5 MG immediate release tablet Commonly known as: Oxy IR/ROXICODONE Take 1 tablet (5 mg total) by mouth every 6 (six) hours as needed for severe pain.   pantoprazole 20 MG tablet Commonly known as: PROTONIX Take 1 tablet (20 mg total) by mouth daily. What changed: when to take this   sucralfate 1 g tablet Commonly known as: Carafate Take  1 tablet (1 g total) by mouth 4 (four) times daily -  with meals and at bedtime.          Follow-up [facility], [place]. Call.   Specialty: General Surgery Why: We  are working on your appointment, call to confirm Please arrive 30 minutes prior to your appointment to check in and fill out paperwork. Bring photo ID and insurance information. Contact information: [address] [place] [phone]                Signed: Wellington Hampshire, [facility] 07/17/2021, 10:26 AM Please see Amion for pager number during day hours 7:00am-4:30pm

## 2021-07-18 LAB — SURGICAL PATHOLOGY

## 2021-07-20 ENCOUNTER — Emergency Department (HOSPITAL_COMMUNITY)
Admission: EM | Admit: 2021-07-20 | Discharge: 2021-07-20 | Disposition: A | Discharge status: Home / Self Care | Payer: 59 | Attending: Emergency Medicine | Admitting: Emergency Medicine

## 2021-07-20 ENCOUNTER — Encounter (HOSPITAL_COMMUNITY): Payer: Self-pay

## 2021-07-20 DIAGNOSIS — Y838 Other surgical procedures as the cause of abnormal reaction of the patient, or of later complication, without mention of misadventure at the time of the procedure: Secondary | ICD-10-CM | POA: Insufficient documentation

## 2021-07-20 DIAGNOSIS — E119 Type 2 diabetes mellitus without complications: Secondary | ICD-10-CM | POA: Diagnosis not present

## 2021-07-20 DIAGNOSIS — Z79899 Other long term (current) drug therapy: Secondary | ICD-10-CM | POA: Insufficient documentation

## 2021-07-20 DIAGNOSIS — Z4801 Encounter for change or removal of surgical wound dressing: Secondary | ICD-10-CM | POA: Diagnosis not present

## 2021-07-20 DIAGNOSIS — L7682 Other postprocedural complications of skin and subcutaneous tissue: Secondary | ICD-10-CM | POA: Diagnosis not present

## 2021-07-20 DIAGNOSIS — I10 Essential (primary) hypertension: Secondary | ICD-10-CM | POA: Insufficient documentation

## 2021-07-20 DIAGNOSIS — Z5189 Encounter for other specified aftercare: Secondary | ICD-10-CM

## 2021-07-20 LAB — CBC WITH DIFFERENTIAL/PLATELET
Abs Immature Granulocytes: 0.03 10*3/uL (ref 0.00–0.07)
Basophils Absolute: 0 10*3/uL (ref 0.0–0.1)
Basophils Relative: 0 %
Eosinophils Absolute: 0.5 10*3/uL (ref 0.0–0.5)
Eosinophils Relative: 5 %
HCT: 39.7 % (ref 36.0–46.0)
Hemoglobin: 12.6 g/dL (ref 12.0–15.0)
Immature Granulocytes: 0 %
Lymphocytes Relative: 27 %
Lymphs Abs: 2.7 10*3/uL (ref 0.7–4.0)
MCH: 29.3 pg (ref 26.0–34.0)
MCHC: 31.7 g/dL (ref 30.0–36.0)
MCV: 92.3 fL (ref 80.0–100.0)
Monocytes Absolute: 0.6 10*3/uL (ref 0.1–1.0)
Monocytes Relative: 6 %
Neutro Abs: 6 10*3/uL (ref 1.7–7.7)
Neutrophils Relative %: 62 %
Platelets: 302 10*3/uL (ref 150–400)
RBC: 4.3 MIL/uL (ref 3.87–5.11)
RDW: 13.2 % (ref 11.5–15.5)
WBC: 9.9 10*3/uL (ref 4.0–10.5)
nRBC: 0 % (ref 0.0–0.2)

## 2021-07-20 LAB — COMPREHENSIVE METABOLIC PANEL
ALT: 27 U/L (ref 0–44)
AST: 28 U/L (ref 15–41)
Albumin: 3.3 g/dL — ABNORMAL LOW (ref 3.5–5.0)
Alkaline Phosphatase: 60 U/L (ref 38–126)
Anion gap: 9 (ref 5–15)
BUN: 25 mg/dL — ABNORMAL HIGH (ref 8–23)
CO2: 25 mmol/L (ref 22–32)
Calcium: 10.2 mg/dL (ref 8.9–10.3)
Chloride: 108 mmol/L (ref 98–111)
Creatinine, Ser: 1.44 mg/dL — ABNORMAL HIGH (ref 0.44–1.00)
GFR, Estimated: 41 mL/min — ABNORMAL LOW (ref >60)
Glucose, Bld: 122 mg/dL — ABNORMAL HIGH (ref 70–99)
Potassium: 3.7 mmol/L (ref 3.5–5.1)
Sodium: 142 mmol/L (ref 135–145)
Total Bilirubin: 0.3 mg/dL (ref 0.3–1.2)
Total Protein: 6.9 g/dL (ref 6.5–8.1)

## 2021-07-20 NOTE — ED Provider Notes (Signed)
Tara Holland   CSN: [id number] Arrival date & time: 07/20/21  6283     History Chief Complaint  Patient presents with   Wound Check    Tara Holland is a 60 y.o. female.  Tara Holland is a 60 y.o. female with a history of hypertension, hyperlipidemia, GERD and recent cholecystectomy, who presents to the ED for evaluation of drainage from her incision site.  Patient had a laparoscopic cholecystectomy done on 10/9, no immediate complications and was discharged the following day.  Patient reports that last night she started noticing some clear drainage only present from her umbilical trocar incision.  These were closed with Dermabond.  She has not noticed any thick or purulent drainage.  She reports she has had pain at her incision sites but pain is not worsening and she denies any new or worsening abdominal pain in general.  Reports that the incision sites have been quite itchy and she was not sure if this was normal to be expected.  She also noticed some redness below her umbilicus yesterday.  No nausea or vomiting, has had normal appetite.  Has been able to move her bowels.  No fevers or chills.  She has only been taking Tylenol because she does not like to use pain medication.  She lives at home by herself as her she is worried she may have overdone it with activity bending to clean out her cats litter box yesterday.  The history is provided by the patient.      Past Medical History:  Diagnosis Date   GERD (gastroesophageal reflux disease)    Hypertension    MMT (medial meniscus tear)    right knee   Pain in joint, hand    Pre-diabetes    Pure hypercholesterolemia    Symptomatic mammary hypertrophy    Vaginal irritation     Patient Active Problem List   Diagnosis Date Noted   Acute cholecystitis 07/15/2021   Symptomatic mammary hypertrophy 02/12/2019   Neck pain 02/12/2019   DM (diabetes mellitus), type 2 ([place]) 11/28/2017    Facial droop 11/27/2017   Malignant hypertension 11/27/2017   Hyperglycemia 11/27/2017   Abdominal pain 05/24/2016   Spasm of vocal cords 11/11/2015   Back pain 04/21/2009   VENTRICULAR HYPERTROPHY, LEFT 02/17/2009   OBESITY 02/02/2009   Essential hypertension 01/04/2009   HYPERCHOLESTEROLEMIA 02/06/2008    Past Surgical History:  Procedure Laterality Date   BREAST LUMPECTOMY     BREAST REDUCTION SURGERY Bilateral 04/23/2019   Procedure: MAMMARY REDUCTION  (BREAST);  Surgeon: Wallace Going, DO;  Location: [facility];  Service: Plastics;  Laterality: Bilateral;   CHOLECYSTECTOMY N/A 07/16/2021   Procedure: LAPAROSCOPIC CHOLECYSTECTOMY WITH INTRAOPERATIVE CHOLANGIOGRAM;  Surgeon: Clovis Riley, MD;  Location: [facility];  Service: General;  Laterality: N/A;   VAGINAL HYSTERECTOMY       OB History   No obstetric history on file.     Family History  Problem Relation Age of Onset   Hypertension Mother    Kidney disease Mother    CVA Mother 49   Kidney disease Brother     Social History   Tobacco Use   Smoking status: Never   Smokeless tobacco: Never  Vaping Use   Vaping Use: Never used  Substance Use Topics   Alcohol use: No   Drug use: No    Home Medications Prior to Admission medications   Medication Sig Start Date End Date Taking?  Authorizing Provider  acetaminophen (TYLENOL) 325 MG tablet Take 2 tablets (650 mg total) by mouth every 6 (six) hours as needed for mild pain. 07/17/21   Meuth, Brooke A, PA-C  amLODipine-benazepril (LOTREL) 10-20 MG capsule Take 1 capsule by mouth daily.    [provider]  famotidine (PEPCID) 20 MG tablet Take 1 tablet (20 mg total) by mouth 2 (two) times daily. 07/09/21   Carlisle Cater, PA-C  Multiple Vitamin (MULTI-VITAMIN DAILY PO) Take 15 mLs by mouth every morning.    [provider]  oxyCODONE (OXY IR/ROXICODONE) 5 MG immediate release tablet Take 1 tablet (5 mg total) by mouth every 6  (six) hours as needed for severe pain. 07/17/21   Meuth, Brooke A, PA-C  pantoprazole (PROTONIX) 20 MG tablet Take 1 tablet (20 mg total) by mouth daily. Patient taking differently: Take 20 mg by mouth 2 (two) times daily. 07/09/21   Carlisle Cater, PA-C  sucralfate (CARAFATE) 1 g tablet Take 1 tablet (1 g total) by mouth 4 (four) times daily -  with meals and at bedtime. 07/09/21   Carlisle Cater, PA-C    Allergies    Sulfonamide derivatives  Review of Systems   Review of Systems  Constitutional:  Negative for chills and fever.  Gastrointestinal:  Negative for abdominal pain, constipation, diarrhea, nausea and vomiting.  Genitourinary:  Negative for dysuria.  Musculoskeletal:  Negative for arthralgias.  Skin:  Positive for color change and wound.  Neurological:  Negative for syncope and light-headedness.  All other systems reviewed and are negative.  Physical Exam Updated Vital Signs BP 135/87 (BP Location: Left Arm)   Pulse 75   Temp 98.4 F (36.9 C) (Oral)   Resp 18   SpO2 100%   Physical Exam Vitals and nursing Holland reviewed.  Constitutional:      General: She is not in acute distress.    Appearance: Normal appearance. She is well-developed. She is not ill-appearing or diaphoretic.  HENT:     Head: Normocephalic and atraumatic.     Mouth/Throat:     Mouth: Mucous membranes are moist.     Pharynx: Oropharynx is clear.  Eyes:     General:        Right eye: No discharge.        Left eye: No discharge.  Cardiovascular:     Rate and Rhythm: Normal rate and regular rhythm.     Pulses: Normal pulses.     Heart sounds: Normal heart sounds.  Pulmonary:     Effort: Pulmonary effort is normal. No respiratory distress.     Breath sounds: Normal breath sounds.     Comments: Respirations equal and unlabored, patient able to speak in full sentences, lungs clear to auscultation bilaterally  Abdominal:     General: There is no distension.     Palpations: There is no mass.      Tenderness: There is abdominal tenderness.     Comments: Surgical incisions to the umbilicus and right upper quadrant with Dermabond noted from recent cholecystectomy, the umbilical incision does have a small amount of serous drainage but does not appear to be open or dehisced, there is some red/purple discoloration below the navel that is not warm to the touch or indurated but is mildly tender. (See photo below)  Patient does have some mild tenderness over her incisions but otherwise abdominal exam is reassuring, no guarding or peritoneal signs.  Bowel sounds present throughout.  Musculoskeletal:  General: No deformity.     Cervical back: Neck supple.  Skin:    General: Skin is warm and dry.  Neurological:     Mental Status: She is alert and oriented to person, place, and time.     Coordination: Coordination normal.  Psychiatric:        Mood and Affect: Mood normal.        Behavior: Behavior normal.      ED Results / Procedures / Treatments   Labs (all labs ordered are listed, but only abnormal results are displayed) Labs Reviewed  COMPREHENSIVE METABOLIC PANEL - Abnormal; Notable for the following components:      Result Value   Glucose, Bld 122 (*)    BUN 25 (*)    Creatinine, Ser 1.44 (*)    Albumin 3.3 (*)    GFR, Estimated 41 (*)    All other components within normal limits  CBC WITH DIFFERENTIAL/PLATELET    EKG None  Radiology No results found.  Procedures Procedures   Medications Ordered in ED Medications - No data to display  ED Course  I have reviewed the triage vital signs and the nursing notes.  Pertinent labs & imaging results that were available during my care of the patient were reviewed by me and considered in my medical decision making (see chart for details).    MDM Rules/Calculators/A&P                           60 year old female presents for wound check after recent cholecystectomy, has noticed some clear drainage from her umbilical  incision, and redness along the incision.  No worsening pain, fevers, nausea or vomiting.  Moving her bowels normally..  Incision is improved with swab and some serous drainage was noted but no opening of incision or dehiscence noted.  Incisions are appropriately tender on exam but with no guarding or peritoneal signs.  Patient is well-appearing with normal vitals.  Will check basic labs and discuss with surgery.  Suspect redness below the umbilicus is actually an area of bruising and is not warm to the he did directly from the incision.  Lab work is overall very reassuring, no leukocytosis, renal function at baseline and no other significant electrolyte derangements  Case discussed with PA Margie Billet with general surgery, she reviewed photos in the chart and agrees the redness is likely area of bruising, does not feel that she needs antibiotics, imaging or further emergent evaluation.  Recommends applying a dressing to help collect serous drainage, encouraging patient to clean daily in the shower with warm water and soap and follow-up in the surgery clinic as planned.  Provided patient with reassurance, and instructions on continued wound care, suspect serous drainage from healing and that patient may have been overdone it with activity.  Provided instructions on and wound care at her incision site and strict return precautions.  She expresses understanding and agreement.  Discharged home in good condition.  Final Clinical Impression(s) / ED Diagnoses Final diagnoses:  Visit for wound check    Rx / DC Orders ED Discharge Orders     None        Jacqlyn Larsen, [place] 07/20/21 9381    Malvin Johns, MD 07/20/21 1404

## 2021-07-20 NOTE — ED Triage Notes (Signed)
Pt arrived via POV, c/o drainage to incision site s/p cholecystectomy 10/9. Denies any fevers. Clear drainage at site in triage.

## 2021-07-20 NOTE — Discharge Instructions (Signed)
You can have some clear drainage from wounds as they heal, keep a dressing over the wound to help collect any drainage.  The redness just below your incision is likely a bruise.  Continue to monitor the area closely.  If you see white or thick yellow drainage, noticed redness directly over the wound, have increasing pain, fevers or chills you should return to the emergency department  To help continue to care for your wounds you can wash them gently with warm water and soap daily in the shower and then keep the one over your navel that is draining covered with a dressing.  Make sure you are taking it easy and not overdoing it so that these areas have time to heal.  If you have continued drainage that is not improving call to schedule close follow-up with the surgery clinic.

## 2021-07-22 ENCOUNTER — Emergency Department (HOSPITAL_COMMUNITY)
Admission: EM | Admit: 2021-07-22 | Discharge: 2021-07-22 | Disposition: A | Discharge status: Home / Self Care | Payer: 59 | Attending: Emergency Medicine | Admitting: Emergency Medicine

## 2021-07-22 ENCOUNTER — Encounter (HOSPITAL_COMMUNITY): Payer: Self-pay

## 2021-07-22 ENCOUNTER — Other Ambulatory Visit: Payer: Self-pay

## 2021-07-22 DIAGNOSIS — R21 Rash and other nonspecific skin eruption: Secondary | ICD-10-CM | POA: Insufficient documentation

## 2021-07-22 DIAGNOSIS — Z79899 Other long term (current) drug therapy: Secondary | ICD-10-CM | POA: Insufficient documentation

## 2021-07-22 DIAGNOSIS — Y836 Removal of other organ (partial) (total) as the cause of abnormal reaction of the patient, or of later complication, without mention of misadventure at the time of the procedure: Secondary | ICD-10-CM | POA: Insufficient documentation

## 2021-07-22 DIAGNOSIS — L7682 Other postprocedural complications of skin and subcutaneous tissue: Secondary | ICD-10-CM | POA: Insufficient documentation

## 2021-07-22 DIAGNOSIS — E119 Type 2 diabetes mellitus without complications: Secondary | ICD-10-CM | POA: Insufficient documentation

## 2021-07-22 DIAGNOSIS — I10 Essential (primary) hypertension: Secondary | ICD-10-CM | POA: Diagnosis not present

## 2021-07-22 MED ORDER — HYDROXYZINE HCL 25 MG PO TABS: 25.0000 mg | ORAL_TABLET | Freq: Four times a day (QID) | ORAL | 0 refills | Status: DC

## 2021-07-22 MED ORDER — CEPHALEXIN 500 MG PO CAPS: 500.0000 mg | ORAL_CAPSULE | Freq: Two times a day (BID) | ORAL | 0 refills | Status: DC

## 2021-07-22 MED ORDER — HYDROCORTISONE 1 % EX CREA: TOPICAL_CREAM | CUTANEOUS | 0 refills | Status: DC

## 2021-07-22 NOTE — Discharge Instructions (Addendum)
Call surgery Monday to let them know you are seen here  Use medications as prescribed.  Return for new or worsening symptoms.

## 2021-07-22 NOTE — ED Triage Notes (Signed)
Pt c/o incision site irritation and redness after a cholecystectomy on 10/9. Pt reports the area has been itching and she has been using cortisone cream.

## 2021-07-22 NOTE — ED Provider Notes (Signed)
[facility] DEPT Provider Note   CSN: [id number] Arrival date & time: 07/22/21  1210    History Chief Complaint  Patient presents with   Wound Check    Tara Holland is a 60 y.o. female here for evaluation of rash to abdomin. Had lap chole on 07/16/21 with [place] [facility]. Since then has developed rash surrounding all her port sites. Lower wound had some clear drainage however no purulent drainage. Now with some blistering lesions surround her sites. They are pruritic in nature. No tender. Tried hydrocortisone cream to wounds yesterday which helped with the itching. States she has been allergic to tape and thinks she has been allergic to "glue" as well with prior surgeries.  No fever, chills, emesis, abdominal pain, redness, warmth to lesions.  Denies additional aggravating or relieving factors.  Of note she was here 2 days ago for similar complaints.  History obtained from patient and past medical records.  No interpreter used  HPI     Past Medical History:  Diagnosis Date   GERD (gastroesophageal reflux disease)    Hypertension    MMT (medial meniscus tear)    right knee   Pain in joint, hand    Pre-diabetes    Pure hypercholesterolemia    Symptomatic mammary hypertrophy    Vaginal irritation     Patient Active Problem List   Diagnosis Date Noted   Acute cholecystitis 07/15/2021   Symptomatic mammary hypertrophy 02/12/2019   Neck pain 02/12/2019   DM (diabetes mellitus), type 2 ([place]) 11/28/2017   Facial droop 11/27/2017   Malignant hypertension 11/27/2017   Hyperglycemia 11/27/2017   Abdominal pain 05/24/2016   Spasm of vocal cords 11/11/2015   Back pain 04/21/2009   VENTRICULAR HYPERTROPHY, LEFT 02/17/2009   OBESITY 02/02/2009   Essential hypertension 01/04/2009   HYPERCHOLESTEROLEMIA 02/06/2008    Past Surgical History:  Procedure Laterality Date   BREAST LUMPECTOMY     BREAST REDUCTION SURGERY Bilateral 04/23/2019    Procedure: MAMMARY REDUCTION  (BREAST);  Surgeon: Wallace Going, DO;  Location: [facility];  Service: Plastics;  Laterality: Bilateral;   CHOLECYSTECTOMY N/A 07/16/2021   Procedure: LAPAROSCOPIC CHOLECYSTECTOMY WITH INTRAOPERATIVE CHOLANGIOGRAM;  Surgeon: Clovis Riley, MD;  Location: [facility];  Service: General;  Laterality: N/A;   VAGINAL HYSTERECTOMY       OB History   No obstetric history on file.     Family History  Problem Relation Age of Onset   Hypertension Mother    Kidney disease Mother    CVA Mother 44   Kidney disease Brother     Social History   Tobacco Use   Smoking status: Never   Smokeless tobacco: Never  Vaping Use   Vaping Use: Never used  Substance Use Topics   Alcohol use: No   Drug use: No    Home Medications Prior to Admission medications   Medication Sig Start Date End Date Taking? Authorizing Provider  cephALEXin (KEFLEX) 500 MG capsule Take 1 capsule (500 mg total) by mouth 2 (two) times daily. 07/22/21  Yes Brendin Situ A, PA-C  hydrocortisone cream 1 % Apply to affected area 2 times daily 07/22/21  Yes Lory Nowaczyk A, PA-C  hydrOXYzine (ATARAX/VISTARIL) 25 MG tablet Take 1 tablet (25 mg total) by mouth every 6 (six) hours. 07/22/21  Yes Zyron Deeley A, PA-C  acetaminophen (TYLENOL) 325 MG tablet Take 2 tablets (650 mg total) by mouth every 6 (six) hours as needed for mild  pain. 07/17/21   Meuth, Brooke A, PA-C  amLODipine-benazepril (LOTREL) 10-20 MG capsule Take 1 capsule by mouth daily.    [provider]  famotidine (PEPCID) 20 MG tablet Take 1 tablet (20 mg total) by mouth 2 (two) times daily. 07/09/21   Carlisle Cater, PA-C  Multiple Vitamin (MULTI-VITAMIN DAILY PO) Take 15 mLs by mouth every morning.    [provider]  oxyCODONE (OXY IR/ROXICODONE) 5 MG immediate release tablet Take 1 tablet (5 mg total) by mouth every 6 (six) hours as needed for severe pain. 07/17/21   Meuth, Brooke A, PA-C   pantoprazole (PROTONIX) 20 MG tablet Take 1 tablet (20 mg total) by mouth daily. Patient taking differently: Take 20 mg by mouth 2 (two) times daily. 07/09/21   Carlisle Cater, PA-C  sucralfate (CARAFATE) 1 g tablet Take 1 tablet (1 g total) by mouth 4 (four) times daily -  with meals and at bedtime. 07/09/21   Carlisle Cater, PA-C    Allergies    Sulfonamide derivatives  Review of Systems   Review of Systems  Constitutional: Negative.   HENT: Negative.    Respiratory: Negative.    Cardiovascular: Negative.   Gastrointestinal: Negative.   Genitourinary: Negative.   Musculoskeletal: Negative.   Skin:  Positive for rash. Negative for wound.  Neurological: Negative.   All other systems reviewed and are negative.  Physical Exam Updated Vital Signs BP (!) 120/108 (BP Location: Left Arm)   Pulse 67   Temp 98.3 F (36.8 C) (Oral)   Resp 18   SpO2 100%   Physical Exam Vitals and nursing note reviewed.  Constitutional:      General: She is not in acute distress.    Appearance: She is well-developed. She is not ill-appearing.  HENT:     Head: Atraumatic.  Eyes:     Pupils: Pupils are equal, round, and reactive to light.  Cardiovascular:     Rate and Rhythm: Normal rate.  Pulmonary:     Effort: No respiratory distress.  Abdominal:     General: Bowel sounds are normal. There is no distension.     Palpations: Abdomen is soft.     Comments: Soft, nontender.   Musculoskeletal:        General: Normal range of motion.     Cervical back: Normal range of motion.  Skin:    General: Skin is warm and dry.     Comments: Port sites with surrounding pustules.  Nontender.  No erythema or warmth.  No purulent drainage to wounds.  No fluctuance induration  Neurological:     General: No focal deficit present.     Mental Status: She is alert.  Psychiatric:        Mood and Affect: Mood normal.       ED Results / Procedures / Treatments   Labs (all labs ordered are listed, but only  abnormal results are displayed) Labs Reviewed - No data to display  EKG None  Radiology No results found.  Procedures Procedures   Medications Ordered in ED Medications - No data to display  ED Course  I have reviewed the triage vital signs and the nursing notes.  Pertinent labs & imaging results that were available during my care of the patient were reviewed by me and considered in my medical decision making (see chart for details).  Here for evaluation of pruritic rash surrounding port sites s/p cholecystectomy 1 week ago.  She is afebrile, nonseptic, not ill-appearing.  She  has no systemic symptoms.  Abdomen soft, nontender.  Wounds appear to be contact dermatitis.  Has had similar reactions with adhesives for prior surgical procedures as well.  Did states she had some drainage from her lower wound however no purulent drainage on exam.  No fluctuance, induration, surrounding erythema or warmth.  At ime I have low suspicion for infection however patient is requesting antibiotics at this time.  Given this is her second visit will DC home on Keflex, Atarax for itching, cortisone cream. Discussed close FU with surgery and 2 days for a wound recheck.  The patient has been appropriately medically screened and/or stabilized in the ED. I have low suspicion for any other emergent medical condition which would require further screening, evaluation or treatment in the ED or require inpatient management.  Patient is hemodynamically stable and in no acute distress.  Patient able to ambulate in department prior to ED.  Evaluation does not show acute pathology that would require ongoing or additional emergent interventions while in the emergency department or further inpatient treatment.  I have discussed the diagnosis with the patient and answered all questions.  Pain is been managed while in the emergency department and patient has no further complaints prior to discharge.  Patient is comfortable with  plan discussed in room and is stable for discharge at this time.  I have discussed strict return precautions for returning to the emergency department.  Patient was encouraged to follow-up with PCP/specialist refer to at discharge.     MDM Rules/Calculators/A&P                            Final Clinical Impression(s) / ED Diagnoses Final diagnoses:  Rash    Rx / DC Orders ED Discharge Orders          Ordered    hydrocortisone cream 1 %        07/22/21 1536    hydrOXYzine (ATARAX/VISTARIL) 25 MG tablet  Every 6 hours        07/22/21 1536    cephALEXin (KEFLEX) 500 MG capsule  2 times daily        07/22/21 1536             Zakari Bathe A, PA-C 07/22/21 1606    Godfrey Pick, MD 07/24/21 1512

## 2021-07-22 NOTE — ED Provider Notes (Signed)
Emergency Medicine Provider Triage Evaluation Note  Tara Holland , a 60 y.o. female  was evaluated in triage.  Pt complains of rash to her incision site to the abdomen. She c/o itching to the site.  Review of Systems  Positive: rash Negative: fever  Physical Exam  BP (!) 120/108 (BP Location: Left Arm)   Pulse 67   Temp 98.3 F (36.8 C) (Oral)   Resp 18   SpO2 100%  Gen:   Awake, no distress   Resp:  Normal effort  MSK:   Moves extremities without difficulty  Other:  Vesicular erythematous rash to the incision sites of the abdomen  Medical Decision Making  Medically screening exam initiated at 12:29 PM.  Appropriate orders placed.  Tara Holland was informed that the remainder of the evaluation will be completed by another provider, this initial triage assessment does not replace that evaluation, and the importance of remaining in the ED until their evaluation is complete.     Bishop Dublin 07/22/21 1232    Wyvonnia Dusky, MD 07/22/21 [phone]

## 2021-07-27 DIAGNOSIS — Z9049 Acquired absence of other specified parts of digestive tract: Secondary | ICD-10-CM | POA: Insufficient documentation

## 2022-10-08 ENCOUNTER — Other Ambulatory Visit: Payer: Self-pay

## 2022-10-08 ENCOUNTER — Ambulatory Visit
Admission: EM | Admit: 2022-10-08 | Discharge: 2022-10-08 | Disposition: A | Discharge status: Home / Self Care | Payer: Self-pay | Attending: Physician Assistant | Admitting: Physician Assistant

## 2022-10-08 ENCOUNTER — Encounter: Payer: Self-pay | Admitting: Emergency Medicine

## 2022-10-08 DIAGNOSIS — K047 Periapical abscess without sinus: Secondary | ICD-10-CM

## 2022-10-08 DIAGNOSIS — K0889 Other specified disorders of teeth and supporting structures: Secondary | ICD-10-CM

## 2022-10-08 MED ORDER — AMOXICILLIN 500 MG PO CAPS: 500.0000 mg | ORAL_CAPSULE | Freq: Three times a day (TID) | ORAL | 0 refills | Status: DC

## 2022-10-08 NOTE — ED Triage Notes (Signed)
Pt sts left sided dental pain x 1 week

## 2022-10-08 NOTE — ED Provider Notes (Signed)
[name]    CSN: [id number] Arrival date & time: 10/08/22  1229      History   Chief Complaint Chief Complaint  Patient presents with   Dental Pain    HPI Tara Holland is a 62 y.o. female.   62 year old female presents with dental pain.  Patient indicates for the past week she has been having left lower molar tooth pain and discomfort.  Patient indicates that she is sensitive to hot and cold.  She indicates she cannot chew on the left side due to the pain.  She has started noticing that she is having swelling on the left side of the jaw on the outside due to the pain and discomfort.  She denies any fever or chills.  She has been taking Tylenol OTC without relief of her symptoms.  She indicates she does have an appointment to see a dentist on Thursday to have the area evaluated and repaired.  Patient denies any nausea or vomiting   Dental Pain   Past Medical History:  Diagnosis Date   GERD (gastroesophageal reflux disease)    Hypertension    MMT (medial meniscus tear)    right knee   Pain in joint, hand    Pre-diabetes    Pure hypercholesterolemia    Symptomatic mammary hypertrophy    Vaginal irritation     Patient Active Problem List   Diagnosis Date Noted   Acute cholecystitis 07/15/2021   Symptomatic mammary hypertrophy 02/12/2019   Neck pain 02/12/2019   DM (diabetes mellitus), type 2 ([place]) 11/28/2017   Facial droop 11/27/2017   Malignant hypertension 11/27/2017   Hyperglycemia 11/27/2017   Abdominal pain 05/24/2016   Spasm of vocal cords 11/11/2015   Back pain 04/21/2009   VENTRICULAR HYPERTROPHY, LEFT 02/17/2009   OBESITY 02/02/2009   Essential hypertension 01/04/2009   HYPERCHOLESTEROLEMIA 02/06/2008    Past Surgical History:  Procedure Laterality Date   BREAST LUMPECTOMY     BREAST REDUCTION SURGERY Bilateral 04/23/2019   Procedure: MAMMARY REDUCTION  (BREAST);  Surgeon: Wallace Going, DO;  Location: [place];   Service: Plastics;  Laterality: Bilateral;   CHOLECYSTECTOMY N/A 07/16/2021   Procedure: LAPAROSCOPIC CHOLECYSTECTOMY WITH INTRAOPERATIVE CHOLANGIOGRAM;  Surgeon: Clovis Riley, MD;  Location: [facility];  Service: General;  Laterality: N/A;   VAGINAL HYSTERECTOMY      OB History   No obstetric history on file.      Home Medications    Prior to Admission medications   Medication Sig Start Date End Date Taking? Authorizing Provider  amoxicillin (AMOXIL) 500 MG capsule Take 1 capsule (500 mg total) by mouth 3 (three) times daily. 10/08/22  Yes Nyoka Lint, PA-C  acetaminophen (TYLENOL) 325 MG tablet Take 2 tablets (650 mg total) by mouth every 6 (six) hours as needed for mild pain. 07/17/21   Meuth, Brooke A, PA-C  amLODipine-benazepril (LOTREL) 10-20 MG capsule Take 1 capsule by mouth daily.    [provider]  cephALEXin (KEFLEX) 500 MG capsule Take 1 capsule (500 mg total) by mouth 2 (two) times daily. 07/22/21   Henderly, Britni A, PA-C  famotidine (PEPCID) 20 MG tablet Take 1 tablet (20 mg total) by mouth 2 (two) times daily. 07/09/21   Carlisle Cater, PA-C  hydrocortisone cream 1 % Apply to affected area 2 times daily 07/22/21   Henderly, Britni A, PA-C  hydrOXYzine (ATARAX/VISTARIL) 25 MG tablet Take 1 tablet (25 mg total) by mouth every 6 (six) hours. 07/22/21  Henderly, Britni A, PA-C  Multiple Vitamin (MULTI-VITAMIN DAILY PO) Take 15 mLs by mouth every morning.    [provider]  oxyCODONE (OXY IR/ROXICODONE) 5 MG immediate release tablet Take 1 tablet (5 mg total) by mouth every 6 (six) hours as needed for severe pain. 07/17/21   Meuth, Brooke A, PA-C  pantoprazole (PROTONIX) 20 MG tablet Take 1 tablet (20 mg total) by mouth daily. Patient taking differently: Take 20 mg by mouth 2 (two) times daily. 07/09/21   Carlisle Cater, PA-C  sucralfate (CARAFATE) 1 g tablet Take 1 tablet (1 g total) by mouth 4 (four) times daily -  with meals and at bedtime. 07/09/21    Carlisle Cater, PA-C    Family History Family History  Problem Relation Age of Onset   Hypertension Mother    Kidney disease Mother    CVA Mother 31   Kidney disease Brother     Social History Social History   Tobacco Use   Smoking status: Never   Smokeless tobacco: Never  Vaping Use   Vaping Use: Never used  Substance Use Topics   Alcohol use: No   Drug use: No     Allergies   Sulfonamide derivatives   Review of Systems Review of Systems  HENT:  Positive for dental problem (left lower molar).      Physical Exam Triage Vital Signs ED Triage Vitals [10/08/22 1340]  Enc Vitals Group     BP (!) 163/97     Pulse Rate 89     Resp 18     Temp 98.5 F (36.9 C)     Temp Source Oral     SpO2 95 %     Weight      Height      Head Circumference      Peak Flow      Pain Score 8     Pain Loc      Pain Edu?      Excl. in [place]?    No data found.  Updated Vital Signs BP (!) 163/97 (BP Location: Right Arm)   Pulse 89   Temp 98.5 F (36.9 C) (Oral)   Resp 18   SpO2 95%   Visual Acuity Right Eye Distance:   Left Eye Distance:   Bilateral Distance:    Right Eye Near:   Left Eye Near:    Bilateral Near:     Physical Exam Constitutional:      Appearance: Normal appearance.  HENT:     Right Ear: Tympanic membrane and ear canal normal.     Left Ear: Tympanic membrane and ear canal normal.     Mouth/Throat:     Mouth: Mucous membranes are moist.     Pharynx: Oropharynx is clear.      Comments: Left lower jaw with molar tooth infected with mild swelling around the gumline and mild left external facial swelling around the same area.  No unusual redness noted.  No drainage is present. Neurological:     Mental Status: She is alert.      UC Treatments / Results  Labs (all labs ordered are listed, but only abnormal results are displayed) Labs Reviewed - No data to display  EKG   Radiology No results found.  Procedures Procedures (including  critical care time)  Medications Ordered in UC Medications - No data to display  Initial Impression / Assessment and Plan / UC Course  I have reviewed the triage vital signs and the  nursing notes.  Pertinent labs & imaging results that were available during my care of the patient were reviewed by me and considered in my medical decision making (see chart for details).    Plan: 1.  The dental abscess will be treated with the following: A.  Amoxicillin 500 mg 3 times a day for 7 days to treat the infection. 2.  The dental pain will be treated with the following: A.  Advised Tylenol or ibuprofen to help control dental pain and swelling. 3.  Advised patient to follow-up with dentist to have the area evaluated and repaired. Final Clinical Impressions(s) / UC Diagnoses   Final diagnoses:  Pain, dental  Dental abscess     Discharge Instructions      Advised take Tylenol or ibuprofen to help reduce pain and swelling. Advised take the amoxicillin 500 mg 3 times a day until completed to treat the infection. Advised to follow-up with a dentist to have the area evaluated and repaired.    ED Prescriptions     Medication Sig Dispense Auth. Provider   amoxicillin (AMOXIL) 500 MG capsule Take 1 capsule (500 mg total) by mouth 3 (three) times daily. 21 capsule Nyoka Lint, PA-C      PDMP not reviewed this encounter.   Nyoka Lint, PA-C 10/08/22 1402

## 2022-10-08 NOTE — Discharge Instructions (Signed)
Advised take Tylenol or ibuprofen to help reduce pain and swelling. Advised take the amoxicillin 500 mg 3 times a day until completed to treat the infection. Advised to follow-up with a dentist to have the area evaluated and repaired.

## 2022-10-21 ENCOUNTER — Ambulatory Visit
Admission: EM | Admit: 2022-10-21 | Discharge: 2022-10-21 | Disposition: A | Discharge status: Home / Self Care | Payer: BLUE CROSS/BLUE SHIELD | Attending: Internal Medicine | Admitting: Internal Medicine

## 2022-10-21 DIAGNOSIS — R051 Acute cough: Secondary | ICD-10-CM | POA: Diagnosis present

## 2022-10-21 DIAGNOSIS — J029 Acute pharyngitis, unspecified: Secondary | ICD-10-CM | POA: Diagnosis present

## 2022-10-21 DIAGNOSIS — B349 Viral infection, unspecified: Secondary | ICD-10-CM | POA: Diagnosis present

## 2022-10-21 LAB — POCT RAPID STREP A (OFFICE): Rapid Strep A Screen: NEGATIVE

## 2022-10-21 MED ORDER — BENZONATATE 100 MG PO CAPS: 100.0000 mg | ORAL_CAPSULE | Freq: Three times a day (TID) | ORAL | 0 refills | Status: DC | PRN

## 2022-10-21 MED ORDER — FLUTICASONE PROPIONATE 50 MCG/ACT NA SUSP: 1.0000 | Freq: Every day | NASAL | 0 refills | Status: DC

## 2022-10-21 NOTE — ED Triage Notes (Signed)
Pt presents to [facility] with co of cough, sore throat for 3 days. Pt reports she has been taking coricidin Difficulty sleeping due to cough. Pt reports neg at home covid test and no fevers.

## 2022-10-21 NOTE — ED Provider Notes (Signed)
[name]    CSN: [id number] Arrival date & time: 10/21/22  0806      History   Chief Complaint Chief Complaint  Patient presents with   Cough    HPI Tara Holland is a 62 y.o. female.   Patient presents with cough cough and sore throat that has been present for about 3 days.  Patient reports that sore throat has mainly resolved and mainly hurts when she coughs at this point.  Denies any associated runny nose or nasal congestion.  Patient reports cough is dry.  Denies any associated fever.  Reports coworkers have had similar symptoms.  She has taken 3 COVID tests at home that were all negative.  Denies chest pain, shortness of breath, ear pain, nausea, vomiting, diarrhea, abdominal pain.  Has taken Coricidin with minimal improvement in symptoms.  Denies history of asthma or COPD and patient does not smoke cigarettes.   Cough   Past Medical History:  Diagnosis Date   GERD (gastroesophageal reflux disease)    Hypertension    MMT (medial meniscus tear)    right knee   Pain in joint, hand    Pre-diabetes    Pure hypercholesterolemia    Symptomatic mammary hypertrophy    Vaginal irritation     Patient Active Problem List   Diagnosis Date Noted   Acute cholecystitis 07/15/2021   Symptomatic mammary hypertrophy 02/12/2019   Neck pain 02/12/2019   DM (diabetes mellitus), type 2 ([place]) 11/28/2017   Facial droop 11/27/2017   Malignant hypertension 11/27/2017   Hyperglycemia 11/27/2017   Abdominal pain 05/24/2016   Spasm of vocal cords 11/11/2015   Back pain 04/21/2009   VENTRICULAR HYPERTROPHY, LEFT 02/17/2009   OBESITY 02/02/2009   Essential hypertension 01/04/2009   HYPERCHOLESTEROLEMIA 02/06/2008    Past Surgical History:  Procedure Laterality Date   BREAST LUMPECTOMY     BREAST REDUCTION SURGERY Bilateral 04/23/2019   Procedure: MAMMARY REDUCTION  (BREAST);  Surgeon: Wallace Going, DO;  Location: [place];  Service:  Plastics;  Laterality: Bilateral;   CHOLECYSTECTOMY N/A 07/16/2021   Procedure: LAPAROSCOPIC CHOLECYSTECTOMY WITH INTRAOPERATIVE CHOLANGIOGRAM;  Surgeon: Clovis Riley, MD;  Location: [facility];  Service: General;  Laterality: N/A;   VAGINAL HYSTERECTOMY      OB History   No obstetric history on file.      Home Medications    Prior to Admission medications   Medication Sig Start Date End Date Taking? Authorizing Provider  benzonatate (TESSALON) 100 MG capsule Take 1 capsule (100 mg total) by mouth every 8 (eight) hours as needed for cough. 10/21/22  Yes Mahogani Holohan, Hildred Alamin E, FNP  fluticasone (FLONASE) 50 MCG/ACT nasal spray Place 1 spray into both nostrils daily. 10/21/22  Yes Mallorie Norrod, Michele Rockers, FNP  acetaminophen (TYLENOL) 325 MG tablet Take 2 tablets (650 mg total) by mouth every 6 (six) hours as needed for mild pain. 07/17/21   Meuth, Brooke A, PA-C  amLODipine-benazepril (LOTREL) 10-20 MG capsule Take 1 capsule by mouth daily.    [provider]  amoxicillin (AMOXIL) 500 MG capsule Take 1 capsule (500 mg total) by mouth 3 (three) times daily. 10/08/22   Nyoka Lint, PA-C  cephALEXin (KEFLEX) 500 MG capsule Take 1 capsule (500 mg total) by mouth 2 (two) times daily. 07/22/21   Henderly, Britni A, PA-C  famotidine (PEPCID) 20 MG tablet Take 1 tablet (20 mg total) by mouth 2 (two) times daily. 07/09/21   Carlisle Cater, PA-C  hydrocortisone cream  1 % Apply to affected area 2 times daily 07/22/21   Henderly, Britni A, PA-C  hydrOXYzine (ATARAX/VISTARIL) 25 MG tablet Take 1 tablet (25 mg total) by mouth every 6 (six) hours. 07/22/21   Henderly, Britni A, PA-C  Multiple Vitamin (MULTI-VITAMIN DAILY PO) Take 15 mLs by mouth every morning.    [provider]  oxyCODONE (OXY IR/ROXICODONE) 5 MG immediate release tablet Take 1 tablet (5 mg total) by mouth every 6 (six) hours as needed for severe pain. 07/17/21   Meuth, Brooke A, PA-C  pantoprazole (PROTONIX) 20 MG tablet Take 1 tablet (20  mg total) by mouth daily. Patient taking differently: Take 20 mg by mouth 2 (two) times daily. 07/09/21   Carlisle Cater, PA-C  sucralfate (CARAFATE) 1 g tablet Take 1 tablet (1 g total) by mouth 4 (four) times daily -  with meals and at bedtime. 07/09/21   Carlisle Cater, PA-C    Family History Family History  Problem Relation Age of Onset   Hypertension Mother    Kidney disease Mother    CVA Mother 72   Kidney disease Brother     Social History Social History   Tobacco Use   Smoking status: Never   Smokeless tobacco: Never  Vaping Use   Vaping Use: Never used  Substance Use Topics   Alcohol use: No   Drug use: No     Allergies   Sulfonamide derivatives   Review of Systems Review of Systems Per HPI  Physical Exam Triage Vital Signs ED Triage Vitals  Enc Vitals Group     BP 10/21/22 0815 137/63     Pulse Rate 10/21/22 0815 (!) 53     Resp 10/21/22 0815 19     Temp 10/21/22 0815 97.9 F (36.6 C)     Temp Source 10/21/22 0815 Oral     SpO2 10/21/22 0815 98 %     Weight --      Height --      Head Circumference --      Peak Flow --      Pain Score 10/21/22 0814 0     Pain Loc --      Pain Edu? --      Excl. in [place]? --    No data found.  Updated Vital Signs BP 137/63   Pulse 66   Temp 97.9 F (36.6 C) (Oral)   Resp 19   SpO2 98%   Visual Acuity Right Eye Distance:   Left Eye Distance:   Bilateral Distance:    Right Eye Near:   Left Eye Near:    Bilateral Near:     Physical Exam Constitutional:      General: She is not in acute distress.    Appearance: Normal appearance. She is not toxic-appearing or diaphoretic.  HENT:     Head: Normocephalic and atraumatic.     Right Ear: Tympanic membrane and ear canal normal.     Left Ear: Tympanic membrane and ear canal normal.     Nose: Congestion present.     Mouth/Throat:     Mouth: Mucous membranes are moist.     Pharynx: Posterior oropharyngeal erythema present.  Eyes:     Extraocular  Movements: Extraocular movements intact.     Conjunctiva/sclera: Conjunctivae normal.     Pupils: Pupils are equal, round, and reactive to light.  Cardiovascular:     Rate and Rhythm: Normal rate and regular rhythm.     Pulses: Normal pulses.  Heart sounds: Normal heart sounds.  Pulmonary:     Effort: Pulmonary effort is normal. No respiratory distress.     Breath sounds: Normal breath sounds. No stridor. No wheezing, rhonchi or rales.  Abdominal:     General: Abdomen is flat. Bowel sounds are normal.     Palpations: Abdomen is soft.  Musculoskeletal:        General: Normal range of motion.     Cervical back: Normal range of motion.  Skin:    General: Skin is warm and dry.  Neurological:     General: No focal deficit present.     Mental Status: She is alert and oriented to person, place, and time. Mental status is at baseline.  Psychiatric:        Mood and Affect: Mood normal.        Behavior: Behavior normal.      UC Treatments / Results  Labs (all labs ordered are listed, but only abnormal results are displayed) Labs Reviewed  CULTURE, GROUP A STREP [facility])  POCT RAPID STREP A (OFFICE)    EKG   Radiology No results found.  Procedures Procedures (including critical care time)  Medications Ordered in UC Medications - No data to display  Initial Impression / Assessment and Plan / UC Course  I have reviewed the triage vital signs and the nursing notes.  Pertinent labs & imaging results that were available during my care of the patient were reviewed by me and considered in my medical decision making (see chart for details).     Patient presents with symptoms likely from a viral upper respiratory infection.  Do not suspect underlying cardiopulmonary process. Symptoms seem unlikely related to ACS, CHF or COPD exacerbations, pneumonia, pneumothorax. Patient is nontoxic appearing and not in need of emergent medical intervention.  Rapid strep was negative.  Throat  culture pending.  Viral testing was deferred given negative COVID test at home.  Recommended symptom control with medications and supportive care.  Patient sent prescriptions.  Return if symptoms fail to improve in 1-2 weeks or you develop shortness of breath, chest pain, severe headache. Patient states understanding and is agreeable.  Discharged with PCP followup.  Final Clinical Impressions(s) / UC Diagnoses   Final diagnoses:  Acute cough  Viral illness  Sore throat     Discharge Instructions      Rapid strep test was negative.  Throat culture is pending.  We will call if it is abnormal.  I suspect that you have a viral illness that should run its course and self resolve with symptomatic treatment as we discussed.  I have prescribed you 2 different medications to help alleviate fluid in the ears and cough.  Please follow-up if any symptoms persist or worsen.    ED Prescriptions     Medication Sig Dispense Auth. Provider   benzonatate (TESSALON) 100 MG capsule Take 1 capsule (100 mg total) by mouth every 8 (eight) hours as needed for cough. 21 capsule [place], [place] E, [place]   fluticasone [facility]) 50 MCG/ACT nasal spray Place 1 spray into both nostrils daily. 16 g Teodora Medici, [place]      PDMP not reviewed this encounter.   Teodora Medici, [place] 10/21/22 [phone]

## 2022-10-21 NOTE — Discharge Instructions (Signed)
Rapid strep test was negative.  Throat culture is pending.  We will call if it is abnormal.  I suspect that you have a viral illness that should run its course and self resolve with symptomatic treatment as we discussed.  I have prescribed you 2 different medications to help alleviate fluid in the ears and cough.  Please follow-up if any symptoms persist or worsen.

## 2022-10-24 LAB — CULTURE, GROUP A STREP (THRC)

## 2022-12-26 ENCOUNTER — Telehealth: Payer: Medicaid Other | Admitting: Nurse Practitioner

## 2022-12-26 DIAGNOSIS — U071 COVID-19: Secondary | ICD-10-CM | POA: Diagnosis not present

## 2022-12-26 MED ORDER — MOLNUPIRAVIR EUA 200MG CAPSULE: 4.0000 | ORAL_CAPSULE | Freq: Two times a day (BID) | ORAL | 0 refills | Status: AC

## 2022-12-26 MED ORDER — BENZONATATE 100 MG PO CAPS: 100.0000 mg | ORAL_CAPSULE | Freq: Three times a day (TID) | ORAL | 0 refills | Status: DC | PRN

## 2022-12-26 NOTE — Progress Notes (Signed)
Virtual Visit Consent   Tara Holland, you are scheduled for a virtual visit with Mary-Margaret Hassell Done, [place], a [facility] provider, today.     Just as with appointments in the office, your consent must be obtained to participate.  Your consent will be active for this visit and any virtual visit you may have with one of our providers in the next 365 days.     If you have a MyChart account, a copy of this consent can be sent to you electronically.  All virtual visits are billed to your insurance company just like a traditional visit in the office.    As this is a virtual visit, video technology does not allow for your provider to perform a traditional examination.  This may limit your provider's ability to fully assess your condition.  If your provider identifies any concerns that need to be evaluated in person or the need to arrange testing (such as labs, EKG, etc.), we will make arrangements to do so.     Although advances in technology are sophisticated, we cannot ensure that it will always work on either your end or our end.  If the connection with a video visit is poor, the visit may have to be switched to a telephone visit.  With either a video or telephone visit, we are not always able to ensure that we have a secure connection.     I need to obtain your verbal consent now.   Are you willing to proceed with your visit today? YES   Tara Holland has provided verbal consent on 12/26/2022 for a virtual visit (video or telephone).   Mary-Margaret Hassell Done, FNP   Date: 12/26/2022 11:05 AM   Virtual Visit via Video Note   I, Mary-Margaret Hassell Done, connected with Tara Holland ([id number], October 16, 1960) on 12/26/22 at 12:00 PM EDT by a video-enabled telemedicine application and verified that I am speaking with the correct person using two identifiers.  Location: Patient: Virtual Visit Location Patient: Home Provider: Virtual Visit Location Provider: Mobile   I discussed the limitations  of evaluation and management by telemedicine and the availability of in person appointments. The patient expressed understanding and agreed to proceed.    History of Present Illness: Tara Holland is a 62 y.o. who identifies as a female who was assigned female at birth, and is being seen today for covid positive.  HPI: URI  This is a new problem. The current episode started yesterday. The problem has been gradually worsening. The maximum temperature recorded prior to her arrival was 100.4 - 100.9 F. The fever has been present for 1 to 2 days. Associated symptoms include congestion, coughing, headaches, rhinorrhea and a sore throat.   Tested positive for covid this morning Review of Systems  HENT:  Positive for congestion, rhinorrhea and sore throat.   Respiratory:  Positive for cough.   Neurological:  Positive for headaches.    Problems:  Patient Active Problem List   Diagnosis Date Noted   Acute cholecystitis 07/15/2021   Symptomatic mammary hypertrophy 02/12/2019   Neck pain 02/12/2019   DM (diabetes mellitus), type 2 (Tara Holland) 11/28/2017   Facial droop 11/27/2017   Malignant hypertension 11/27/2017   Hyperglycemia 11/27/2017   Abdominal pain 05/24/2016   Spasm of vocal cords 11/11/2015   Back pain 04/21/2009   VENTRICULAR HYPERTROPHY, LEFT 02/17/2009   OBESITY 02/02/2009   Essential hypertension 01/04/2009   HYPERCHOLESTEROLEMIA 02/06/2008    Allergies:  Allergies  Allergen Reactions  Sulfonamide Derivatives Itching    Severe itching   Medications:  Current Outpatient Medications:    acetaminophen (TYLENOL) 325 MG tablet, Take 2 tablets (650 mg total) by mouth every 6 (six) hours as needed for mild pain., Disp: , Rfl:    amLODipine-benazepril (LOTREL) 10-20 MG capsule, Take 1 capsule by mouth daily., Disp: , Rfl:    amoxicillin (AMOXIL) 500 MG capsule, Take 1 capsule (500 mg total) by mouth 3 (three) times daily., Disp: 21 capsule, Rfl: 0   benzonatate (TESSALON) 100 MG  capsule, Take 1 capsule (100 mg total) by mouth every 8 (eight) hours as needed for cough., Disp: 21 capsule, Rfl: 0   cephALEXin (KEFLEX) 500 MG capsule, Take 1 capsule (500 mg total) by mouth 2 (two) times daily., Disp: 20 capsule, Rfl: 0   famotidine (PEPCID) 20 MG tablet, Take 1 tablet (20 mg total) by mouth 2 (two) times daily., Disp: 14 tablet, Rfl: 0   fluticasone (FLONASE) 50 MCG/ACT nasal spray, Place 1 spray into both nostrils daily., Disp: 16 g, Rfl: 0   hydrocortisone cream 1 %, Apply to affected area 2 times daily, Disp: 15 g, Rfl: 0   hydrOXYzine (ATARAX/VISTARIL) 25 MG tablet, Take 1 tablet (25 mg total) by mouth every 6 (six) hours., Disp: 12 tablet, Rfl: 0   Multiple Vitamin (MULTI-VITAMIN DAILY PO), Take 15 mLs by mouth every morning., Disp: , Rfl:    oxyCODONE (OXY IR/ROXICODONE) 5 MG immediate release tablet, Take 1 tablet (5 mg total) by mouth every 6 (six) hours as needed for severe pain., Disp: 15 tablet, Rfl: 0   pantoprazole (PROTONIX) 20 MG tablet, Take 1 tablet (20 mg total) by mouth daily. (Patient taking differently: Take 20 mg by mouth 2 (two) times daily.), Disp: 30 tablet, Rfl: 0   sucralfate (CARAFATE) 1 g tablet, Take 1 tablet (1 g total) by mouth 4 (four) times daily -  with meals and at bedtime., Disp: 60 tablet, Rfl: 1  Observations/Objective: Patient is well-developed, well-nourished in no acute distress.  Resting comfortably  at home.  Head is normocephalic, atraumatic.  No labored breathing.  Speech is clear and coherent with logical content.  Patient is alert and oriented at baseline.  Raspy voice Deep cough  Assessment and Plan:  Tara Holland in today with chief complaint of Covid Positive   1. Positive self-administered antigen test for COVID-19 1. Take meds as prescribed 2. Use a cool mist humidifier especially during the winter months and when heat has been humid. 3. Use saline nose sprays frequently 4. Saline irrigations of the nose can  be very helpful if done frequently.  * 4X daily for 1 week*  * Use of a nettie pot can be helpful with this. Follow directions with this* 5. Drink plenty of fluids 6. Keep thermostat turn down low 7.For any cough or congestion- tessalon perles 8. For fever or aces or pains- take tylenol or ibuprofen appropriate for age and weight.  * for fevers greater than 101 orally you may alternate ibuprofen and tylenol every  3 hours.      Follow Up Instructions: I discussed the assessment and treatment plan with the patient. The patient was provided an opportunity to ask questions and all were answered. The patient agreed with the plan and demonstrated an understanding of the instructions.  A copy of instructions were sent to the patient via MyChart.  The patient was advised to call back or seek an in-person evaluation if the symptoms worsen  or if the condition fails to improve as anticipated.  Time:  I spent 8 minutes with the patient via telehealth technology discussing the above problems/concerns.    Mary-Margaret Hassell Done, FNP

## 2022-12-26 NOTE — Patient Instructions (Signed)
Benna Dunks, thank you for joining Chevis Pretty, FNP for today's virtual visit.  While this provider is not your primary care provider (PCP), if your PCP is located in our provider database this encounter information will be shared with them immediately following your visit.   [facility] account gives you access to today's visit and all your visits, tests, and labs performed at [facility] " click here if you don't have a [facility] account or go to [email or web address]  Consent: (Patient) Tara Holland provided verbal consent for this virtual visit at the beginning of the encounter.  Current Medications:  Current Outpatient Medications:    molnupiravir EUA (LAGEVRIO) 200 mg CAPS capsule, Take 4 capsules (800 mg total) by mouth 2 (two) times daily for 5 days., Disp: 40 capsule, Rfl: 0   acetaminophen (TYLENOL) 325 MG tablet, Take 2 tablets (650 mg total) by mouth every 6 (six) hours as needed for mild pain., Disp: , Rfl:    amLODipine-benazepril (LOTREL) 10-20 MG capsule, Take 1 capsule by mouth daily., Disp: , Rfl:    amoxicillin (AMOXIL) 500 MG capsule, Take 1 capsule (500 mg total) by mouth 3 (three) times daily., Disp: 21 capsule, Rfl: 0   benzonatate (TESSALON) 100 MG capsule, Take 1 capsule (100 mg total) by mouth every 8 (eight) hours as needed for cough., Disp: 21 capsule, Rfl: 0   cephALEXin (KEFLEX) 500 MG capsule, Take 1 capsule (500 mg total) by mouth 2 (two) times daily., Disp: 20 capsule, Rfl: 0   famotidine (PEPCID) 20 MG tablet, Take 1 tablet (20 mg total) by mouth 2 (two) times daily., Disp: 14 tablet, Rfl: 0   fluticasone (FLONASE) 50 MCG/ACT nasal spray, Place 1 spray into both nostrils daily., Disp: 16 g, Rfl: 0   hydrocortisone cream 1 %, Apply to affected area 2 times daily, Disp: 15 g, Rfl: 0   hydrOXYzine (ATARAX/VISTARIL) 25 MG tablet, Take 1 tablet (25 mg total) by mouth every 6 (six) hours., Disp: 12 tablet, Rfl: 0    Multiple Vitamin (MULTI-VITAMIN DAILY PO), Take 15 mLs by mouth every morning., Disp: , Rfl:    oxyCODONE (OXY IR/ROXICODONE) 5 MG immediate release tablet, Take 1 tablet (5 mg total) by mouth every 6 (six) hours as needed for severe pain., Disp: 15 tablet, Rfl: 0   pantoprazole (PROTONIX) 20 MG tablet, Take 1 tablet (20 mg total) by mouth daily. (Patient taking differently: Take 20 mg by mouth 2 (two) times daily.), Disp: 30 tablet, Rfl: 0   sucralfate (CARAFATE) 1 g tablet, Take 1 tablet (1 g total) by mouth 4 (four) times daily -  with meals and at bedtime., Disp: 60 tablet, Rfl: 1   Medications ordered in this encounter:  Meds ordered this encounter  Medications   molnupiravir EUA (LAGEVRIO) 200 mg CAPS capsule    Sig: Take 4 capsules (800 mg total) by mouth 2 (two) times daily for 5 days.    Dispense:  40 capsule    Refill:  0    Order Specific Question:   Supervising Provider    Answer:   Chase Picket [id number]     *If you need refills on other medications prior to your next appointment, please contact your pharmacy*  Follow-Up: Call back or seek an in-person evaluation if the symptoms worsen or if the condition fails to improve as anticipated.  [facility] [phone]  Other Instructions 1. Take meds as prescribed 2. Use a cool  mist humidifier especially during the winter months and when heat has been humid. 3. Use saline nose sprays frequently 4. Saline irrigations of the nose can be very helpful if done frequently.  * 4X daily for 1 week*  * Use of a nettie pot can be helpful with this. Follow directions with this* 5. Drink plenty of fluids 6. Keep thermostat turn down low 7.For any cough or congestion- tessalon perles 8. For fever or aces or pains- take tylenol or ibuprofen appropriate for age and weight.  * for fevers greater than 101 orally you may alternate ibuprofen and tylenol every  3 hours.      If you have been instructed to have an  in-person evaluation today at a local Urgent Care facility, please use the link below. It will take you to a list of all of our available [facility], including address, phone number and hours of operation. Please do not delay care.  [facility]  If you or a family member do not have a primary care provider, use the link below to schedule a visit and establish care. When you choose a [facility] primary care physician or advanced practice provider, you gain a long-term partner in health. Find a Primary Care Provider  Learn more about [facility]'s in-office and virtual care options: [facility]

## 2023-01-18 ENCOUNTER — Encounter: Payer: Self-pay | Admitting: Emergency Medicine

## 2023-01-18 ENCOUNTER — Ambulatory Visit
Admission: EM | Admit: 2023-01-18 | Discharge: 2023-01-18 | Disposition: A | Discharge status: Home / Self Care | Payer: Medicaid Other | Attending: Emergency Medicine | Admitting: Emergency Medicine

## 2023-01-18 DIAGNOSIS — Z760 Encounter for issue of repeat prescription: Secondary | ICD-10-CM

## 2023-01-18 DIAGNOSIS — I10 Essential (primary) hypertension: Secondary | ICD-10-CM | POA: Diagnosis not present

## 2023-01-18 MED ORDER — AMLODIPINE BESY-BENAZEPRIL HCL 10-20 MG PO CAPS: 1.0000 | ORAL_CAPSULE | Freq: Every day | ORAL | 2 refills | Status: AC

## 2023-01-18 NOTE — ED Triage Notes (Signed)
Pt reports being out of her blood pressure medication for 3 days. States BP has been elevated at home and had one episode of a dizzy spell. Denies headache or vision changes. Normally takes Amlodipine/Benazepril 10-20 mg

## 2023-01-18 NOTE — Discharge Instructions (Signed)
Your prescription for blood pressure medication has been sent to your pharmacy.  Thank you for visiting urgent care today.

## 2023-01-18 NOTE — ED Provider Notes (Signed)
[name]    CSN: [id number] Arrival date & time: 01/18/23  0810    HISTORY   Chief Complaint  Patient presents with   Hypertension   HPI Tara Holland is a pleasant, 62 y.o. female who presents to urgent care today. Pt reports being out of her blood pressure medication for 3 days. States BP has been elevated at home and that she has had one episode of a dizzy spell yesterday. Denies chest pain, shortness of breath headache or vision changes.  States she is taken amlodipine/Benazepril 10-20 mg for many years.  Patient is requesting a refill of her blood pressure medication while she is awaiting an appointment with her PCP.  The history is provided by the patient.   Past Medical History:  Diagnosis Date   GERD (gastroesophageal reflux disease)    Hypertension    MMT (medial meniscus tear)    right knee   Pain in joint, hand    Pre-diabetes    Pure hypercholesterolemia    Symptomatic mammary hypertrophy    Vaginal irritation    Patient Active Problem List   Diagnosis Date Noted   Acute cholecystitis 07/15/2021   Symptomatic mammary hypertrophy 02/12/2019   Neck pain 02/12/2019   DM (diabetes mellitus), type 2 11/28/2017   Facial droop 11/27/2017   Malignant hypertension 11/27/2017   Hyperglycemia 11/27/2017   Abdominal pain 05/24/2016   Spasm of vocal cords 11/11/2015   Back pain 04/21/2009   VENTRICULAR HYPERTROPHY, LEFT 02/17/2009   OBESITY 02/02/2009   Essential hypertension 01/04/2009   HYPERCHOLESTEROLEMIA 02/06/2008   Past Surgical History:  Procedure Laterality Date   BREAST LUMPECTOMY     BREAST REDUCTION SURGERY Bilateral 04/23/2019   Procedure: MAMMARY REDUCTION  (BREAST);  Surgeon: Peggye Form, DO;  Location: [facility];  Service: Plastics;  Laterality: Bilateral;   CHOLECYSTECTOMY N/A 07/16/2021   Procedure: LAPAROSCOPIC CHOLECYSTECTOMY WITH INTRAOPERATIVE CHOLANGIOGRAM;  Surgeon: Berna Bue, MD;   Location: [facility];  Service: General;  Laterality: N/A;   VAGINAL HYSTERECTOMY     OB History   No obstetric history on file.    Home Medications    Prior to Admission medications   Medication Sig Start Date End Date Taking? Authorizing Provider  acetaminophen (TYLENOL) 325 MG tablet Take 2 tablets (650 mg total) by mouth every 6 (six) hours as needed for mild pain. 07/17/21   Meuth, Brooke A, PA-C  amLODipine-benazepril (LOTREL) 10-20 MG capsule Take 1 capsule by mouth daily.    [provider]  amoxicillin (AMOXIL) 500 MG capsule Take 1 capsule (500 mg total) by mouth 3 (three) times daily. 10/08/22   Ellsworth Lennox, PA-C  benzonatate (TESSALON) 100 MG capsule Take 1 capsule (100 mg total) by mouth every 8 (eight) hours as needed for cough. 12/26/22   Daphine Deutscher, Mary-Margaret, FNP  cephALEXin (KEFLEX) 500 MG capsule Take 1 capsule (500 mg total) by mouth 2 (two) times daily. 07/22/21   Henderly, Britni A, PA-C  famotidine (PEPCID) 20 MG tablet Take 1 tablet (20 mg total) by mouth 2 (two) times daily. 07/09/21   Renne Crigler, PA-C  fluticasone (FLONASE) 50 MCG/ACT nasal spray Place 1 spray into both nostrils daily. 10/21/22   Gustavus Bryant, FNP  hydrocortisone cream 1 % Apply to affected area 2 times daily 07/22/21   Henderly, Britni A, PA-C  hydrOXYzine (ATARAX/VISTARIL) 25 MG tablet Take 1 tablet (25 mg total) by mouth every 6 (six) hours. 07/22/21   Henderly, Britni A,  PA-C  Multiple Vitamin (MULTI-VITAMIN DAILY PO) Take 15 mLs by mouth every morning.    [provider]  oxyCODONE (OXY IR/ROXICODONE) 5 MG immediate release tablet Take 1 tablet (5 mg total) by mouth every 6 (six) hours as needed for severe pain. 07/17/21   Meuth, Brooke A, PA-C  pantoprazole (PROTONIX) 20 MG tablet Take 1 tablet (20 mg total) by mouth daily. Patient taking differently: Take 20 mg by mouth 2 (two) times daily. 07/09/21   Renne Crigler, PA-C  sucralfate (CARAFATE) 1 g tablet Take 1 tablet (1 g  total) by mouth 4 (four) times daily -  with meals and at bedtime. 07/09/21   Renne Crigler, PA-C    Family History Family History  Problem Relation Age of Onset   Hypertension Mother    Kidney disease Mother    CVA Mother 63   Kidney disease Brother    Social History Social History   Tobacco Use   Smoking status: Never   Smokeless tobacco: Never  Vaping Use   Vaping Use: Never used  Substance Use Topics   Alcohol use: No   Drug use: No   Allergies   Sulfa antibiotics, Sulfonamide derivatives, and Sucralfate  Review of Systems Review of Systems Pertinent findings revealed after performing a 14 point review of systems has been noted in the history of present illness.  Physical Exam Vital Signs BP (!) 158/90 (BP Location: Left Arm)   Pulse (!) 56   Temp 98.3 F (36.8 C) (Oral)   Resp 18   SpO2 98%   No data found.  Physical Exam Vitals and nursing note reviewed.  Constitutional:      General: She is not in acute distress.    Appearance: Normal appearance.  HENT:     Head: Normocephalic and atraumatic.  Eyes:     Pupils: Pupils are equal, round, and reactive to light.  Cardiovascular:     Rate and Rhythm: Normal rate and regular rhythm.  Pulmonary:     Effort: Pulmonary effort is normal.     Breath sounds: Normal breath sounds.  Musculoskeletal:        General: Normal range of motion.     Cervical back: Normal range of motion and neck supple.  Skin:    General: Skin is warm and dry.  Neurological:     General: No focal deficit present.     Mental Status: She is alert and oriented to person, place, and time. Mental status is at baseline.  Psychiatric:        Mood and Affect: Mood normal.        Behavior: Behavior normal.        Thought Content: Thought content normal.        Judgment: Judgment normal.     Visual Acuity Right Eye Distance:   Left Eye Distance:   Bilateral Distance:    Right Eye Near:   Left Eye Near:    Bilateral Near:     UC  Couse / Diagnostics / Procedures:     Radiology No results found.  Procedures Procedures (including critical care time) EKG  Pending results:  Labs Reviewed - No data to display  Medications Ordered in UC: Medications - No data to display  UC Diagnoses / Final Clinical Impressions(s)   I have reviewed the triage vital signs and the nursing notes.  Pertinent labs & imaging results that were available during my care of the patient were reviewed by me and considered in  my medical decision making (see chart for details).    Final diagnoses:  Essential hypertension  Medication refill   Patient provided with refill of her blood pressure medication and advised to make an appointment with her PCP as soon as possible.  Please see discharge instructions below for details of plan of care as provided to patient. ED Prescriptions     Medication Sig Dispense Auth. Provider   amLODipine-benazepril (LOTREL) 10-20 MG capsule Take 1 capsule by mouth daily. 30 capsule Theadora Rama Scales, PA-C      PDMP not reviewed this encounter.  Pending results:  Labs Reviewed - No data to display  Discharge Instructions:   Discharge Instructions      Your prescription for blood pressure medication has been sent to your pharmacy.  Thank you for visiting urgent care today.      Disposition Upon Discharge:  Condition: stable for discharge home  Patient presented with an acute illness with associated systemic symptoms and significant discomfort requiring urgent management. In my opinion, this is a condition that a prudent lay person (someone who possesses an average knowledge of health and medicine) may potentially expect to result in complications if not addressed urgently such as respiratory distress, impairment of bodily function or dysfunction of bodily organs.   Routine symptom specific, illness specific and/or disease specific instructions were discussed with the patient and/or  caregiver at length.   As such, the patient has been evaluated and assessed, work-up was performed and treatment was provided in alignment with urgent care protocols and evidence based medicine.  Patient/parent/caregiver has been advised that the patient may require follow up for further testing and treatment if the symptoms continue in spite of treatment, as clinically indicated and appropriate.  Patient/parent/caregiver has been advised to return to the [facility] or PCP if no better; to PCP or the Emergency Department if new signs and symptoms develop, or if the current signs or symptoms continue to change or worsen for further workup, evaluation and treatment as clinically indicated and appropriate  The patient will follow up with their current PCP if and as advised. If the patient does not currently have a PCP we will assist them in obtaining one.   The patient may need specialty follow up if the symptoms continue, in spite of conservative treatment and management, for further workup, evaluation, consultation and treatment as clinically indicated and appropriate.  Patient/parent/caregiver verbalized understanding and agreement of plan as discussed.  All questions were addressed during visit.  Please see discharge instructions below for further details of plan.  This office note has been dictated using Teaching laboratory technician.  Unfortunately, this method of dictation can sometimes lead to typographical or grammatical errors.  I apologize for your inconvenience in advance if this occurs.  Please do not hesitate to reach out to me if clarification is needed.      Theadora Rama Scales, [place] 01/18/23 [phone]

## 2023-02-02 ENCOUNTER — Other Ambulatory Visit: Payer: Self-pay

## 2023-02-02 ENCOUNTER — Emergency Department (HOSPITAL_COMMUNITY)
Admission: EM | Admit: 2023-02-02 | Discharge: 2023-02-02 | Disposition: A | Discharge status: Home / Self Care | Payer: Medicaid Other | Attending: Emergency Medicine | Admitting: Emergency Medicine

## 2023-02-02 ENCOUNTER — Encounter (HOSPITAL_COMMUNITY): Payer: Self-pay

## 2023-02-02 ENCOUNTER — Emergency Department (HOSPITAL_COMMUNITY): Payer: Medicaid Other

## 2023-02-02 DIAGNOSIS — I10 Essential (primary) hypertension: Secondary | ICD-10-CM | POA: Insufficient documentation

## 2023-02-02 DIAGNOSIS — N132 Hydronephrosis with renal and ureteral calculous obstruction: Secondary | ICD-10-CM | POA: Insufficient documentation

## 2023-02-02 DIAGNOSIS — E119 Type 2 diabetes mellitus without complications: Secondary | ICD-10-CM | POA: Insufficient documentation

## 2023-02-02 DIAGNOSIS — N200 Calculus of kidney: Secondary | ICD-10-CM

## 2023-02-02 DIAGNOSIS — R109 Unspecified abdominal pain: Secondary | ICD-10-CM | POA: Diagnosis present

## 2023-02-02 LAB — CBC
HCT: 41.2 % (ref 36.0–46.0)
Hemoglobin: 13 g/dL (ref 12.0–15.0)
MCH: 28.8 pg (ref 26.0–34.0)
MCHC: 31.6 g/dL (ref 30.0–36.0)
MCV: 91.4 fL (ref 80.0–100.0)
Platelets: 234 10*3/uL (ref 150–400)
RBC: 4.51 MIL/uL (ref 3.87–5.11)
RDW: 13.3 % (ref 11.5–15.5)
WBC: 8.1 10*3/uL (ref 4.0–10.5)
nRBC: 0 % (ref 0.0–0.2)

## 2023-02-02 LAB — URINALYSIS, ROUTINE W REFLEX MICROSCOPIC
Bacteria, UA: NONE SEEN
Bilirubin Urine: NEGATIVE
Glucose, UA: NEGATIVE mg/dL
Ketones, ur: NEGATIVE mg/dL
Leukocytes,Ua: NEGATIVE
Nitrite: NEGATIVE
Protein, ur: 30 mg/dL — AB
RBC / HPF: >50 RBC/hpf (ref 0–5)
Specific Gravity, Urine: 1.018 (ref 1.005–1.030)
pH: 5 (ref 5.0–8.0)

## 2023-02-02 LAB — COMPREHENSIVE METABOLIC PANEL
ALT: 16 U/L (ref 0–44)
AST: 22 U/L (ref 15–41)
Albumin: 3.6 g/dL (ref 3.5–5.0)
Alkaline Phosphatase: 91 U/L (ref 38–126)
Anion gap: 9 (ref 5–15)
BUN: 14 mg/dL (ref 8–23)
CO2: 26 mmol/L (ref 22–32)
Calcium: 9.5 mg/dL (ref 8.9–10.3)
Chloride: 107 mmol/L (ref 98–111)
Creatinine, Ser: 1.45 mg/dL — ABNORMAL HIGH (ref 0.44–1.00)
GFR, Estimated: 40 mL/min — ABNORMAL LOW (ref >60)
Glucose, Bld: 112 mg/dL — ABNORMAL HIGH (ref 70–99)
Potassium: 4.2 mmol/L (ref 3.5–5.1)
Sodium: 142 mmol/L (ref 135–145)
Total Bilirubin: 0.7 mg/dL (ref 0.3–1.2)
Total Protein: 7.1 g/dL (ref 6.5–8.1)

## 2023-02-02 LAB — LIPASE, BLOOD: Lipase: 36 U/L (ref 11–51)

## 2023-02-02 MED ORDER — TRAMADOL HCL 50 MG PO TABS: 50.0000 mg | ORAL_TABLET | Freq: Four times a day (QID) | ORAL | 0 refills | Status: DC | PRN

## 2023-02-02 MED ORDER — TAMSULOSIN HCL 0.4 MG PO CAPS: 0.4000 mg | ORAL_CAPSULE | Freq: Every day | ORAL | 0 refills | Status: AC

## 2023-02-02 MED ORDER — ONDANSETRON 4 MG PO TBDP: 4.0000 mg | ORAL_TABLET | Freq: Three times a day (TID) | ORAL | 0 refills | Status: DC | PRN

## 2023-02-02 MED ORDER — TAMSULOSIN HCL 0.4 MG PO CAPS: 0.4000 mg | ORAL_CAPSULE | Freq: Every day | ORAL | 0 refills | Status: DC

## 2023-02-02 MED ORDER — OXYCODONE HCL 5 MG PO TABS: 5.0000 mg | ORAL_TABLET | Freq: Four times a day (QID) | ORAL | 0 refills | Status: DC | PRN

## 2023-02-02 NOTE — ED Provider Notes (Signed)
Patient here with left flank pain.  Supervised resident visit.  Pain for the last 2 to 3 days.  Denies any chest pain or shortness of breath.  Does not specify any trauma.  Does have maybe a history of kidney stones in the past.  Denies any pain with urination or blood in the urine.  Has a history of acid reflux, hypertension high cholesterol.  Patient overall unremarkable vitals.  Differential diagnosis likely MSK related pain or possibly kidney stone, seems less likely to be infectious process.  Seems less likely to be ACS.  I have no concern for PE.  No respiratory symptoms or tachycardia or risk factors.  Will pursue workup with CBC, CMP, lipase, urinalysis, CT renal study, EKG and troponin.  Please see resident note for further results, evaluation, disposition of the patient.  This chart was dictated using voice recognition software.  Despite best efforts to proofread,  errors can occur which can change the documentation meaning.    Virgina Norfolk, DO 02/02/23 1709

## 2023-02-02 NOTE — ED Provider Notes (Signed)
[facility] AT [facility] Provider Note  Medical Decision Making   HPI: Tara Holland is a 62 y.o. female with history perinent for cholecystitis status post cholecystectomy, HTN, DM, obesity, prior nephrolithiasis who presents complaining of left side pain. Patient arrived via POV.  History provided by patient.  No interpreter required for this encounter.  Patient reports that she has had 3 days of left-sided pain.  Reports the pain feels "like a toothache".  Reports that the pain has been progressively worse, denies any dysuria, hematuria, nausea, vomiting, diarrhea, fevers, chills.  She had a normal bowel movement today.  Denies any rash, does endorse that she had chickenpox as a child.  ROS: As per HPI. Please see MAR for complete past medical history, surgical history, and social history.   Physical exam is pertinent for no rash, abdomen nontender to palpation, no CVA tenderness.   The differential includes but is not limited to nephrolithiasis, pyelonephritis, UTI, bowel obstruction, pancreatitis, hepatitis, shingles.  Additional history obtained from: Not indicated External records from outside source obtained and reviewed including: None  ED provider interpretation of ECG: Rate 57, sinus rhythm, no ST elevations or depression, nonspecific T wave inversions in lead III, V1, T wave flattening in lead V2  ED provider interpretation of radiology/imaging: Chest x-ray without focal airspace opacification, cardiomediastinal silhouette derangement, pleural effusion, bony displacement, pneumothorax.  CT stone study without any air-fluid levels, saturating, I do appreciate radiopaque nephrolith within the mid left ureter with mild upstream hydronephrosis  Labs ordered were interpreted by myself as well as my attending and were incorporated into the medical decision making process for this patient.  ED provider interpretation of labs: Lipase WNL.  UA without  UTI, significant hematuria, CMP without emergent electrolyte derangement, creatinine 1.45, baseline of 1.2-1.5.  BC without leukocytosis, anemia, thrombocytopenia.  Interventions: None  See the EMR for full details regarding lab and imaging results.  Overall well-appearing, no rash appreciable on skin, no dermatomal distribution of symptoms, doubt shingles.  Doubt pancreatitis, lipase WNL.  Doubt hepatitis, LFTs WNL UTI on UA, no CVA tenderness, doubt pyelonephritis, UTI, no evidence of bowel obstruction on CT, CT does reveal nephrolithiasis consistent with patient's history of nephrolithiasis, will discharge with course of tamsulosin, Zofran, Roxicodone and referral to urology.  Patient is comfortable with this plan.     Consults: Not indicated  Disposition: DISCHARGE: I believe that the patient is safe for discharge home with outpatient follow-up. Patient was informed of all pertinent physical exam, laboratory, and imaging findings. Patient's suspected etiology of their symptom presentation was discussed with the patient and all questions were answered. We discussed following up with PCP and urology. I provided thorough ED return precautions. The patient feels safe and comfortable with this plan.  The plan for this patient was discussed with Dr. Lockie Mola, who voiced agreement and who oversaw evaluation and treatment of this patient.  Clinical Impression:  1. Nephrolithiasis    Discharge  Therapies: These medications and interventions were provided for the patient while in the ED. Medications - No data to display  MDM generated using voice dictation software and may contain dictation errors.  Please contact me for any clarification or with any questions.  Clinical Complexity A medically appropriate history, review of systems, and physical exam was performed.  Collateral history obtained from: None I personally reviewed the labs, EKG, imaging as discussed above. Patient's presentation  is most consistent with acute complicated illness / injury requiring diagnostic workup Considered  and ruled out life and body threatening conditions  Treatment: Outpatient referral Medications: Prescription Discussed patient's care with providers from the following different specialties: Not indicated  Physical Exam   ED Triage Vitals [02/02/23 1606]  Enc Vitals Group     BP (!) 161/99     Pulse Rate 67     Resp 19     Temp 98.6 F (37 C)     Temp src      SpO2 98 %     Weight      Height      Head Circumference      Peak Flow      Pain Score      Pain Loc      Pain Edu?      Excl. in GC?      Physical Exam Vitals and nursing note reviewed.  Constitutional:      General: She is not in acute distress.    Appearance: She is well-developed.  HENT:     Head: Normocephalic and atraumatic.  Eyes:     Conjunctiva/sclera: Conjunctivae normal.  Cardiovascular:     Rate and Rhythm: Normal rate and regular rhythm.     Heart sounds: No murmur heard. Pulmonary:     Effort: Pulmonary effort is normal. No respiratory distress.     Breath sounds: Normal breath sounds.  Abdominal:     Palpations: Abdomen is soft.     Tenderness: There is no abdominal tenderness. There is no right CVA tenderness, left CVA tenderness, guarding or rebound.  Musculoskeletal:        General: No swelling.     Cervical back: Neck supple.  Skin:    General: Skin is warm and dry.     Capillary Refill: Capillary refill takes less than 2 seconds.  Neurological:     Mental Status: She is alert.  Psychiatric:        Mood and Affect: Mood normal.       Procedure Note  Procedures  DG Chest Portable 1 View  Final Result    CT Renal Larina Bras Study  Final Result      Julianne Rice, MD Emergency Medicine, PGY-2   Curley Spice, MD 02/02/23 1854    Virgina Norfolk, DO 02/02/23 1908

## 2023-02-02 NOTE — ED Triage Notes (Signed)
Pt came in via POV d/t Lt side pain since yesterday & it is felt right at the level of her bottom rib. A/Ox4, denies n/v or any recent fevers or discomfort worsening after eating.

## 2023-02-02 NOTE — Discharge Instructions (Addendum)
Maximiano Coss  Thank you for allowing [place] to take care of you today.  You came to the Emergency Department today because you had left-sided pain that was not of the past 2 days.  We got a CT scan and it demonstrates that you do have a kidney stone on the left side.  You are not having any symptoms of any urinary tract infection.  We will give you a medication to increase your production of urine to help push it out (tamsulosin), you did take this once a day.  We are prescribing you Zofran, a medication that reduces nausea and vomiting, you can take this every 8 hours as needed.  You should primarily use Tylenol and ibuprofen for pain.  Please do not take more than 3 g of Tylenol in a day.  We are giving you prescription for Roxicodone, and opioid analgesic that you can take if you are still having severe pain despite Tylenol and ibuprofen, please take MiraLAX if you take this medication as it can make you constipated and do not drive while you are on this medication.   To-Do: 1. Please follow-up with your primary doctor within 2 days / as soon as possible.   Please return to the Emergency Department or call 911 if you experience have worsening of your symptoms, or do not get better, chest pain, shortness of breath, severe or significantly worsening pain, high fever, severe confusion, pass out or have any reason to think that you need emergency medical care.   We hope you feel better soon.   Curley Spice, MD Department of Emergency Medicine [facility]

## 2023-02-09 ENCOUNTER — Emergency Department (EMERGENCY_DEPARTMENT_HOSPITAL): Payer: Medicaid Other | Admitting: Anesthesiology

## 2023-02-09 ENCOUNTER — Emergency Department (HOSPITAL_COMMUNITY): Payer: Medicaid Other

## 2023-02-09 ENCOUNTER — Encounter (HOSPITAL_COMMUNITY)
Admission: EM | Disposition: A | Discharge status: Home / Self Care | Payer: Self-pay | Source: Home / Self Care | Attending: Emergency Medicine

## 2023-02-09 ENCOUNTER — Encounter (HOSPITAL_COMMUNITY): Payer: Self-pay

## 2023-02-09 ENCOUNTER — Ambulatory Visit (HOSPITAL_COMMUNITY)
Admission: EM | Admit: 2023-02-09 | Discharge: 2023-02-09 | Disposition: A | Discharge status: Home / Self Care | Payer: Medicaid Other | Attending: Emergency Medicine | Admitting: Emergency Medicine

## 2023-02-09 ENCOUNTER — Emergency Department (HOSPITAL_COMMUNITY): Payer: Medicaid Other | Admitting: Anesthesiology

## 2023-02-09 DIAGNOSIS — Z79899 Other long term (current) drug therapy: Secondary | ICD-10-CM | POA: Diagnosis not present

## 2023-02-09 DIAGNOSIS — N201 Calculus of ureter: Secondary | ICD-10-CM

## 2023-02-09 DIAGNOSIS — E669 Obesity, unspecified: Secondary | ICD-10-CM | POA: Diagnosis not present

## 2023-02-09 DIAGNOSIS — K219 Gastro-esophageal reflux disease without esophagitis: Secondary | ICD-10-CM | POA: Insufficient documentation

## 2023-02-09 DIAGNOSIS — R109 Unspecified abdominal pain: Secondary | ICD-10-CM | POA: Diagnosis present

## 2023-02-09 DIAGNOSIS — N132 Hydronephrosis with renal and ureteral calculous obstruction: Secondary | ICD-10-CM | POA: Insufficient documentation

## 2023-02-09 DIAGNOSIS — I10 Essential (primary) hypertension: Secondary | ICD-10-CM | POA: Diagnosis not present

## 2023-02-09 DIAGNOSIS — N179 Acute kidney failure, unspecified: Secondary | ICD-10-CM

## 2023-02-09 DIAGNOSIS — R7303 Prediabetes: Secondary | ICD-10-CM

## 2023-02-09 DIAGNOSIS — N200 Calculus of kidney: Secondary | ICD-10-CM

## 2023-02-09 HISTORY — PX: CYSTOSCOPY/URETEROSCOPY/HOLMIUM LASER/STENT PLACEMENT: SHX6546

## 2023-02-09 LAB — CBC WITH DIFFERENTIAL/PLATELET
Abs Immature Granulocytes: 0.03 10*3/uL (ref 0.00–0.07)
Basophils Absolute: 0 10*3/uL (ref 0.0–0.1)
Basophils Relative: 0 %
Eosinophils Absolute: 0.1 10*3/uL (ref 0.0–0.5)
Eosinophils Relative: 1 %
HCT: 39.8 % (ref 36.0–46.0)
Hemoglobin: 12.3 g/dL (ref 12.0–15.0)
Immature Granulocytes: 0 %
Lymphocytes Relative: 14 %
Lymphs Abs: 1.4 10*3/uL (ref 0.7–4.0)
MCH: 28.6 pg (ref 26.0–34.0)
MCHC: 30.9 g/dL (ref 30.0–36.0)
MCV: 92.6 fL (ref 80.0–100.0)
Monocytes Absolute: 0.7 10*3/uL (ref 0.1–1.0)
Monocytes Relative: 6 %
Neutro Abs: 7.9 10*3/uL — ABNORMAL HIGH (ref 1.7–7.7)
Neutrophils Relative %: 79 %
Platelets: 271 10*3/uL (ref 150–400)
RBC: 4.3 MIL/uL (ref 3.87–5.11)
RDW: 13.2 % (ref 11.5–15.5)
WBC: 10.2 10*3/uL (ref 4.0–10.5)
nRBC: 0 % (ref 0.0–0.2)

## 2023-02-09 LAB — URINALYSIS, ROUTINE W REFLEX MICROSCOPIC
Bacteria, UA: NONE SEEN
Bilirubin Urine: NEGATIVE
Glucose, UA: NEGATIVE mg/dL
Ketones, ur: NEGATIVE mg/dL
Leukocytes,Ua: NEGATIVE
Nitrite: NEGATIVE
Protein, ur: 30 mg/dL — AB
Specific Gravity, Urine: 1.013 (ref 1.005–1.030)
pH: 7 (ref 5.0–8.0)

## 2023-02-09 LAB — BASIC METABOLIC PANEL
Anion gap: 8 (ref 5–15)
BUN: 28 mg/dL — ABNORMAL HIGH (ref 8–23)
CO2: 27 mmol/L (ref 22–32)
Calcium: 9.7 mg/dL (ref 8.9–10.3)
Chloride: 103 mmol/L (ref 98–111)
Creatinine, Ser: 2.12 mg/dL — ABNORMAL HIGH (ref 0.44–1.00)
GFR, Estimated: 26 mL/min — ABNORMAL LOW (ref >60)
Glucose, Bld: 140 mg/dL — ABNORMAL HIGH (ref 70–99)
Potassium: 4.2 mmol/L (ref 3.5–5.1)
Sodium: 138 mmol/L (ref 135–145)

## 2023-02-09 SURGERY — CYSTOSCOPY/URETEROSCOPY/HOLMIUM LASER/STENT PLACEMENT
Anesthesia: General | Site: Ureter | Laterality: Left

## 2023-02-09 MED ORDER — ONDANSETRON HCL 4 MG/2ML IJ SOLN: 4.0000 mg | Freq: Three times a day (TID) | INTRAMUSCULAR | Status: DC | PRN

## 2023-02-09 MED ORDER — OXYCODONE HCL 5 MG/5ML PO SOLN: 5.0000 mg | Freq: Once | ORAL | Status: DC | PRN

## 2023-02-09 MED ORDER — FENTANYL CITRATE (PF) 100 MCG/2ML IJ SOLN
INTRAMUSCULAR | Status: DC | PRN
  Administered 2023-02-09 (×2): 25 ug via INTRAVENOUS
  Administered 2023-02-09: 50 ug via INTRAVENOUS

## 2023-02-09 MED ORDER — DEXAMETHASONE SODIUM PHOSPHATE 10 MG/ML IJ SOLN
INTRAMUSCULAR | Status: DC | PRN
  Administered 2023-02-09: 5 mg via INTRAVENOUS

## 2023-02-09 MED ORDER — LIDOCAINE HCL (PF) 2 % IJ SOLN
INTRAMUSCULAR | Status: AC
  Filled 2023-02-09: qty 5

## 2023-02-09 MED ORDER — LACTATED RINGERS IV BOLUS
1000.0000 mL | Freq: Once | INTRAVENOUS | Status: AC
  Administered 2023-02-09: 1000 mL via INTRAVENOUS

## 2023-02-09 MED ORDER — ACETAMINOPHEN 325 MG PO TABS: 650.0000 mg | ORAL_TABLET | Freq: Four times a day (QID) | ORAL | Status: DC | PRN

## 2023-02-09 MED ORDER — PROPOFOL 10 MG/ML IV BOLUS
INTRAVENOUS | Status: DC | PRN
  Administered 2023-02-09: 150 mg via INTRAVENOUS

## 2023-02-09 MED ORDER — LACTATED RINGERS IV SOLN: INTRAVENOUS | Status: DC

## 2023-02-09 MED ORDER — FENTANYL CITRATE (PF) 100 MCG/2ML IJ SOLN
INTRAMUSCULAR | Status: AC
  Filled 2023-02-09: qty 2

## 2023-02-09 MED ORDER — CEFAZOLIN SODIUM-DEXTROSE 2-4 GM/100ML-% IV SOLN
2.0000 g | Freq: Once | INTRAVENOUS | Status: AC
  Administered 2023-02-09: 2 g via INTRAVENOUS
  Filled 2023-02-09: qty 100

## 2023-02-09 MED ORDER — ONDANSETRON HCL 4 MG PO TABS: 4.0000 mg | ORAL_TABLET | Freq: Four times a day (QID) | ORAL | 1 refills | Status: DC | PRN

## 2023-02-09 MED ORDER — FENTANYL CITRATE PF 50 MCG/ML IJ SOSY: 25.0000 ug | PREFILLED_SYRINGE | INTRAMUSCULAR | Status: DC | PRN

## 2023-02-09 MED ORDER — MORPHINE SULFATE (PF) 4 MG/ML IV SOLN: 4.0000 mg | INTRAVENOUS | Status: DC | PRN

## 2023-02-09 MED ORDER — OXYCODONE HCL 5 MG PO TABS: 5.0000 mg | ORAL_TABLET | Freq: Once | ORAL | Status: DC | PRN

## 2023-02-09 MED ORDER — OXYCODONE-ACETAMINOPHEN 5-325 MG PO TABS
1.0000 | ORAL_TABLET | Freq: Once | ORAL | Status: AC
  Administered 2023-02-09: 1 via ORAL
  Filled 2023-02-09: qty 1

## 2023-02-09 MED ORDER — SODIUM CHLORIDE 0.9 % IR SOLN
Status: DC | PRN
  Administered 2023-02-09: 3000 mL

## 2023-02-09 MED ORDER — MIDAZOLAM HCL 2 MG/2ML IJ SOLN
INTRAMUSCULAR | Status: AC
  Filled 2023-02-09: qty 2

## 2023-02-09 MED ORDER — ONDANSETRON HCL 4 MG/2ML IJ SOLN
INTRAMUSCULAR | Status: AC
  Filled 2023-02-09: qty 2

## 2023-02-09 MED ORDER — ONDANSETRON HCL 4 MG/2ML IJ SOLN
INTRAMUSCULAR | Status: DC | PRN
  Administered 2023-02-09: 4 mg via INTRAVENOUS

## 2023-02-09 MED ORDER — IOHEXOL 300 MG/ML  SOLN
INTRAMUSCULAR | Status: DC | PRN
  Administered 2023-02-09: 4 mL via URETHRAL

## 2023-02-09 MED ORDER — AMISULPRIDE (ANTIEMETIC) 5 MG/2ML IV SOLN: 10.0000 mg | Freq: Once | INTRAVENOUS | Status: DC | PRN

## 2023-02-09 MED ORDER — DEXAMETHASONE SODIUM PHOSPHATE 10 MG/ML IJ SOLN
INTRAMUSCULAR | Status: AC
  Filled 2023-02-09: qty 1

## 2023-02-09 MED ORDER — ACETAMINOPHEN 10 MG/ML IV SOLN: 1000.0000 mg | Freq: Once | INTRAVENOUS | Status: DC | PRN

## 2023-02-09 MED ORDER — OXYCODONE-ACETAMINOPHEN 5-325 MG PO TABS: 1.0000 | ORAL_TABLET | Freq: Four times a day (QID) | ORAL | 0 refills | Status: AC | PRN

## 2023-02-09 MED ORDER — PROPOFOL 10 MG/ML IV BOLUS
INTRAVENOUS | Status: AC
  Filled 2023-02-09: qty 20

## 2023-02-09 MED ORDER — MIDAZOLAM HCL 2 MG/2ML IJ SOLN
INTRAMUSCULAR | Status: DC | PRN
  Administered 2023-02-09: 2 mg via INTRAVENOUS

## 2023-02-09 MED ORDER — SODIUM CHLORIDE 0.9% FLUSH: 3.0000 mL | Freq: Two times a day (BID) | INTRAVENOUS | Status: DC

## 2023-02-09 MED ORDER — LIDOCAINE 2% (20 MG/ML) 5 ML SYRINGE
INTRAMUSCULAR | Status: DC | PRN
  Administered 2023-02-09: 100 mg via INTRAVENOUS

## 2023-02-09 MED ORDER — SODIUM CHLORIDE 0.9 % IR SOLN
Status: DC | PRN
  Administered 2023-02-09: 1000 mL

## 2023-02-09 SURGICAL SUPPLY — 21 items
BAG URO CATCHER STRL LF (MISCELLANEOUS) ×1 IMPLANT
BASKET STONE NCOMPASS (UROLOGICAL SUPPLIES) IMPLANT
CATH URETERAL DUAL LUMEN 10F (MISCELLANEOUS) IMPLANT
CATH URETL OPEN 5X70 (CATHETERS) IMPLANT
CLOTH BEACON ORANGE TIMEOUT ST (SAFETY) ×1 IMPLANT
EXTRACTOR STONE NITINOL NGAGE (UROLOGICAL SUPPLIES) IMPLANT
GLOVE BIOGEL PI IND STRL 6.5 (GLOVE) IMPLANT
GLOVE SURG SS PI 8.0 STRL IVOR (GLOVE) ×1 IMPLANT
GOWN STRL REUS W/ TWL XL LVL3 (GOWN DISPOSABLE) ×1 IMPLANT
GOWN STRL REUS W/TWL LRG LVL3 (GOWN DISPOSABLE) IMPLANT
GOWN STRL REUS W/TWL XL LVL3 (GOWN DISPOSABLE) ×1
GUIDEWIRE STR DUAL SENSOR (WIRE) ×1 IMPLANT
IV NS IRRIG 3000ML ARTHROMATIC (IV SOLUTION) ×1 IMPLANT
KIT TURNOVER KIT A (KITS) IMPLANT
MANIFOLD NEPTUNE II (INSTRUMENTS) ×1 IMPLANT
PACK CYSTO (CUSTOM PROCEDURE TRAY) ×1 IMPLANT
SHEATH NAVIGATOR HD 11/13X36 (SHEATH) IMPLANT
STENT URET 6FRX26 CONTOUR (STENTS) IMPLANT
TRACTIP FLEXIVA PULSE ID 200 (Laser) IMPLANT
TUBING CONNECTING 10 (TUBING) ×1 IMPLANT
TUBING UROLOGY SET (TUBING) ×1 IMPLANT

## 2023-02-09 NOTE — Transfer of Care (Signed)
Immediate Anesthesia Transfer of Care Note  Patient: Tara Holland  Procedure(s) Performed: CYSTOSCOPY/URETEROSCOPY/HOLMIUM LASER/STENT PLACEMENT (Left: Ureter)  Patient Location: PACU  Anesthesia Type:General  Level of Consciousness: awake, drowsy, patient cooperative, and responds to stimulation  Airway & Oxygen Therapy: Patient Spontanous Breathing and Patient connected to face mask oxygen  Post-op Assessment: Report given to RN and Post -op Vital signs reviewed and stable  Post vital signs: Reviewed and stable  Last Vitals:  Vitals Value Taken Time  BP 157/91 02/09/23 1301  Temp 36.7 C 02/09/23 1301  Pulse 62 02/09/23 1303  Resp 17 02/09/23 1303  SpO2 100 % 02/09/23 1303  Vitals shown include unvalidated device data.  Last Pain:  Vitals:   02/09/23 1055  TempSrc: Oral  PainSc:          Complications: No notable events documented.

## 2023-02-09 NOTE — ED Provider Notes (Signed)
[place] EMERGENCY DEPARTMENT AT [facility] Provider Note   CSN: [id number] Arrival date & time: 02/09/23  0137     History {Add pertinent medical, surgical, social history, OB history to HPI:1} Chief Complaint  Patient presents with  . Flank Pain    Tara Holland is a 62 y.o. female.  62 year old female with a history of nephrolithiasis who presents emergency department with left flank pain.  Patient diagnosed with a kidney stone on 4/27 and went home and has been taking tramadol for her pain and was prescribed Flomax.  Also has been taking ibuprofen at home.  Was given Percocet prior to my evaluation and reports that her symptoms have improved.  Since then has had persistent pain and nausea and vomiting.  Also had some constipation as well.  Denies any fevers, dysuria, or frequency.  Presented due to her intractable pain today.  Does have an upcoming appointment with urology on Wednesday.         Home Medications Prior to Admission medications   Medication Sig Start Date End Date Taking? Authorizing Provider  amLODipine-benazepril (LOTREL) 10-20 MG capsule Take 1 capsule by mouth daily. 01/18/23 04/18/23  Theadora Rama Scales, PA-C  Multiple Vitamin (MULTI-VITAMIN DAILY PO) Take 15 mLs by mouth every morning. Patient not taking: Reported on 02/02/2023    [provider]  ondansetron (ZOFRAN-ODT) 4 MG disintegrating tablet Take 1 tablet (4 mg total) by mouth every 8 (eight) hours as needed for nausea or vomiting. 02/02/23   Curatolo, Adam, DO  traMADol (ULTRAM) 50 MG tablet Take 1 tablet (50 mg total) by mouth every 6 (six) hours as needed. 02/02/23   Curatolo, Adam, DO      Allergies    Sulfa antibiotics, Sulfonamide derivatives, Sucralfate, and Wound dressing adhesive    Review of Systems   Review of Systems  Physical Exam Updated Vital Signs BP 138/86 (BP Location: Left Arm)   Pulse (!) 51   Temp 98.3 F (36.8 C)   Resp 19   SpO2 97%  Physical  Exam Vitals and nursing note reviewed.  Constitutional:      General: She is not in acute distress.    Appearance: She is well-developed.  HENT:     Head: Normocephalic and atraumatic.     Right Ear: External ear normal.     Left Ear: External ear normal.     Nose: Nose normal.  Eyes:     Extraocular Movements: Extraocular movements intact.     Conjunctiva/sclera: Conjunctivae normal.     Pupils: Pupils are equal, round, and reactive to light.  Cardiovascular:     Rate and Rhythm: Normal rate.  Pulmonary:     Effort: Pulmonary effort is normal. No respiratory distress.  Abdominal:     General: Abdomen is flat. There is no distension.     Palpations: Abdomen is soft. There is no mass.     Tenderness: There is no abdominal tenderness. There is no right CVA tenderness, left CVA tenderness or guarding.  Musculoskeletal:     Cervical back: Normal range of motion and neck supple.     Left lower leg: No edema.  Skin:    General: Skin is warm and dry.  Neurological:     Mental Status: She is alert and oriented to person, place, and time. Mental status is at baseline.  Psychiatric:        Mood and Affect: Mood normal.     ED Results / Procedures / Treatments  Labs (all labs ordered are listed, but only abnormal results are displayed) Labs Reviewed  URINALYSIS, ROUTINE W REFLEX MICROSCOPIC - Abnormal; Notable for the following components:      Result Value   Hgb urine dipstick SMALL (*)    Protein, ur 30 (*)    All other components within normal limits  CBC WITH DIFFERENTIAL/PLATELET - Abnormal; Notable for the following components:   Neutro Abs 7.9 (*)    All other components within normal limits  BASIC METABOLIC PANEL - Abnormal; Notable for the following components:   Glucose, Bld 140 (*)    BUN 28 (*)    Creatinine, Ser 2.12 (*)    GFR, Estimated 26 (*)    All other components within normal limits    EKG None  Radiology CT Renal Stone Study  Result Date:  02/09/2023 CLINICAL DATA:  62 year old female with worsening abdominal pain, recently diagnosed with proximal left ureteral calculus. Urology appointment upcoming. EXAM: CT ABDOMEN AND PELVIS WITHOUT CONTRAST TECHNIQUE: Multidetector CT imaging of the abdomen and pelvis was performed following the standard protocol without IV contrast. RADIATION DOSE REDUCTION: This exam was performed according to the departmental dose-optimization program which includes automated exposure control, adjustment of the mA and/or kV according to patient size and/or use of iterative reconstruction technique. COMPARISON:  CT Abdomen and Pelvis 02/02/2023, and earlier. FINDINGS: Lower chest: Stable, negative. Hepatobiliary: Negative, surgically absent gallbladder. Pancreas: Negative. Spleen: Negative. Adrenals/Urinary Tract: Normal adrenal glands. No convincing right nephrolithiasis, negative noncontrast right kidney and ureter. Persistent left hydronephrosis and hydroureter. Obstructing, oval up to 4 x 7 mm ureteral calculus now located about 4.5 cm distal to the left UVJ versus 2 cm previously. See coronal image 72. Degree of hydronephrosis, proximal hydroureter not significantly changed, but there is increased associated inflammatory stranding at the left renal hilum. Distal to that stone the left ureter is negative to the bladder. Small 3 mm left lower pole intrarenal calculus unchanged. Negative bladder, stable pelvic phleboliths. Stomach/Bowel: Decompressed large bowel from the splenic flexure distally. Mild sigmoid diverticulosis in the pelvis. Average transverse and right colon retained stool. Normal appendix series 3, image 60. No dilated small bowel. Negative terminal ileum. Noncontrast stomach and duodenum appear negative. No free air or free fluid. Vascular/Lymphatic: Normal caliber abdominal aorta. Mild Aortoiliac calcified atherosclerosis. No lymphadenopathy identified. Reproductive: Surgically absent uterus. Diminutive or  absent ovaries. Other: No pelvis free fluid.  Chronic pelvic phleboliths are stable. Musculoskeletal: No acute osseous abnormality identified. Chronic lumbosacral junction disc degeneration with vacuum disc. IMPRESSION: 1. Obstructing up to 4 x 7 mm left ureteral calculus has migrated about 2.5 cm since 02/02/2023, now about 4.5 cm distal to the left UVJ. Unchanged moderate left hydronephrosis and proximal hydroureter, with mildly increased inflammatory stranding at the left renal hilum. Stable small left lower pole intrarenal calculus. 2. No new abnormality in the noncontrast abdomen or pelvis. Electronically Signed   By: Odessa Fleming M.D.   On: 02/09/2023 04:19    Procedures Procedures   Medications Ordered in ED Medications  oxyCODONE-acetaminophen (PERCOCET/ROXICET) 5-325 MG per tablet 1 tablet (1 tablet Oral Given 02/09/23 0309)    ED Course/ Medical Decision Making/ A&P Clinical Course as of 02/09/23 5284  Sat Feb 09, 2023  1324 Dr Annabell Howells from urology will see the patient shortly.  [RP]    Clinical Course User Index [RP] Rondel Baton, MD  Medical Decision Making Amount and/or Complexity of Data Reviewed Labs: ordered.  Risk OTC drugs. Prescription drug management.   SABREENA STURDEVANT is a 62 y.o. female with comorbidities that complicate the patient evaluation including nephrolithiasis  Initial Ddx:  Nephrolithiasis, pyelonephritis, lumbar radiculopathy, AAA, AKI, infected stone  MDM:  Feel the patient likely has nephrolithiasis based on their symptoms.  Will obtain urinalysis to assess for pyelonephritis as well but feel this is less likely given the lack of systemic symptoms.  With her nausea and vomiting we will also assess for AKI and repeat CT to assess hydro and stone location.  Given age will also assess for AAA with CT scan. No symptoms that would be suggestive of radiculopathy.  Plan:  Labs Urinalysis CT stone protocol Pain  medication  ED Summary/Re-evaluation:  ***  This patient presents to the ED for concern of complaints listed in HPI, this involves an extensive number of treatment options, and is a complaint that carries with it a high risk of complications and morbidity. Disposition including potential need for admission considered.   Dispo: {Disposition:28069}  Additional history obtained from {Additional History:28067} Records reviewed {Records Reviewed:28068} The following labs were independently interpreted: {labs interpreted:28064} and show {lab findings:28250} I independently reviewed the following imaging with scope of interpretation limited to determining acute life threatening conditions related to emergency care: {imaging interpreted:28065} and agree with the radiologist interpretation with the following exceptions: none I personally reviewed and interpreted cardiac monitoring: {cardiac monitoring:28251} I personally reviewed and interpreted the pt's EKG: see above for interpretation  I have reviewed the patients home medications and made adjustments as needed Consults: {Consultants:28063} Social Determinants of health:  ***   {Document critical care time when appropriate:1} {Document review of labs and clinical decision tools ie heart score, Chads2Vasc2 etc:1}  {Document your independent review of radiology images, and any outside records:1} {Document your discussion with family members, caretakers, and with consultants:1} {Document social determinants of health affecting pt's care:1} {Document your decision making why or why not admission, treatments were needed:1} Final Clinical Impression(s) / ED Diagnoses Final diagnoses:  None    Rx / DC Orders ED Discharge Orders     None

## 2023-02-09 NOTE — Consult Note (Signed)
Subjective: No diagnosis found.   Consult requested by Dr. Vonita Moss.  Tara Holland is a 62 yo female who presents to the ER today with severe left flank pain with N/V that was not relieved by tramadol and home.  She was initially seen for a 4x92mm left proximal stone on 02/02/23.   A repeat CT today shows only slight progression of the stone but persistent left hydro and she has AKI with a Cr of 2.12.  Her UA has no signs of infection and she is afebrile.  She has gotten relief of her pain with percocet in the ER.  She has no prior GU history.  ROS:  Review of Systems  Gastrointestinal:  Positive for nausea.  Genitourinary:  Positive for flank pain.  All other systems reviewed and are negative.   Allergies  Allergen Reactions   Sulfa Antibiotics Itching    Severe itching   Sulfonamide Derivatives Itching    Severe itching   Sucralfate Rash   Wound Dressing Adhesive Rash    Past Medical History:  Diagnosis Date   GERD (gastroesophageal reflux disease)    Hypertension    MMT (medial meniscus tear)    right knee   Pain in joint, hand    Pre-diabetes    Pure hypercholesterolemia    Symptomatic mammary hypertrophy    Vaginal irritation     Past Surgical History:  Procedure Laterality Date   BREAST LUMPECTOMY     BREAST REDUCTION SURGERY Bilateral 04/23/2019   Procedure: MAMMARY REDUCTION  (BREAST);  Surgeon: Peggye Form, DO;  Location:  SURGERY CENTER;  Service: Plastics;  Laterality: Bilateral;   CHOLECYSTECTOMY N/A 07/16/2021   Procedure: LAPAROSCOPIC CHOLECYSTECTOMY WITH INTRAOPERATIVE CHOLANGIOGRAM;  Surgeon: Berna Bue, MD;  Location: [facility];  Service: General;  Laterality: N/A;   VAGINAL HYSTERECTOMY      Social History   Socioeconomic History   Marital status: Single    Spouse name: Not on file   Number of children: Not on file   Years of education: Not on file   Highest education level: Not on file  Occupational History   Occupation:  office worker  Tobacco Use   Smoking status: Never   Smokeless tobacco: Never  Vaping Use   Vaping Use: Never used  Substance and Sexual Activity   Alcohol use: No   Drug use: No   Sexual activity: Not on file  Other Topics Concern   Not on file  Social History Narrative   Not on file   Social Determinants of Health   Financial Resource Strain: Not on file  Food Insecurity: Not on file  Transportation Needs: Not on file  Physical Activity: Not on file  Stress: Not on file  Social Connections: Not on file  Intimate Partner Violence: Not on file    Family History  Problem Relation Age of Onset   Hypertension Mother    Kidney disease Mother    CVA Mother 63   Kidney disease Brother     Anti-infectives: Anti-infectives (From admission, onward)    None       Current Facility-Administered Medications  Medication Dose Route Frequency Provider Last Rate Last Admin   acetaminophen (TYLENOL) tablet 650 mg  650 mg Oral Q6H PRN Rondel Baton, MD       lactated ringers infusion   Intravenous Continuous Rondel Baton, MD 75 mL/hr at 02/09/23 0738 New Bag at 02/09/23 0738   morphine (PF) 4 MG/ML injection 4 mg  4 mg Intravenous Q4H PRN Rondel Baton, MD       ondansetron [facility]) injection 4 mg  4 mg Intravenous Q8H PRN Rondel Baton, MD       Current Outpatient Medications  Medication Sig Dispense Refill   amLODipine-benazepril (LOTREL) 10-20 MG capsule Take 1 capsule by mouth daily. 30 capsule 2   Multiple Vitamin (MULTI-VITAMIN DAILY PO) Take 15 mLs by mouth every morning. (Patient not taking: Reported on 02/02/2023)     ondansetron (ZOFRAN-ODT) 4 MG disintegrating tablet Take 1 tablet (4 mg total) by mouth every 8 (eight) hours as needed for nausea or vomiting. 20 tablet 0   traMADol (ULTRAM) 50 MG tablet Take 1 tablet (50 mg total) by mouth every 6 (six) hours as needed. 15 tablet 0     Objective: Vital signs in last 24 hours: BP (!) 151/91    Pulse (!) 53   Temp 98.3 F (36.8 C)   Resp 19   SpO2 98%   Intake/Output from previous day: No intake/output data recorded. Intake/Output this shift: No intake/output data recorded.   Physical Exam Vitals reviewed.  Constitutional:      Appearance: Normal appearance.  Cardiovascular:     Rate and Rhythm: Normal rate and regular rhythm.  Pulmonary:     Effort: Pulmonary effort is normal. No respiratory distress.     Breath sounds: Normal breath sounds.  Abdominal:     General: Abdomen is flat.     Palpations: Abdomen is soft.     Tenderness: There is left CVA tenderness.  Musculoskeletal:        General: Normal range of motion.  Skin:    General: Skin is warm and dry.  Neurological:     General: No focal deficit present.     Mental Status: She is alert and oriented to person, place, and time.  Psychiatric:        Mood and Affect: Mood normal.        Behavior: Behavior normal.     Lab Results:  Results for orders placed or performed during the hospital encounter of 02/09/23 (from the past 24 hour(s))  Urinalysis, Routine w reflex microscopic -Urine, Clean Catch     Status: Abnormal   Collection Time: 02/09/23  2:00 AM  Result Value Ref Range   Color, Urine YELLOW YELLOW   APPearance CLEAR CLEAR   Specific Gravity, Urine 1.013 1.005 - 1.030   pH 7.0 5.0 - 8.0   Glucose, UA NEGATIVE NEGATIVE mg/dL   Hgb urine dipstick SMALL (A) NEGATIVE   Bilirubin Urine NEGATIVE NEGATIVE   Ketones, ur NEGATIVE NEGATIVE mg/dL   Protein, ur 30 (A) NEGATIVE mg/dL   Nitrite NEGATIVE NEGATIVE   Leukocytes,Ua NEGATIVE NEGATIVE   RBC / HPF 0-5 0 - 5 RBC/hpf   WBC, UA 0-5 0 - 5 WBC/hpf   Bacteria, UA NONE SEEN NONE SEEN   Squamous Epithelial / HPF 0-5 0 - 5 /HPF   Mucus PRESENT   CBC with Differential     Status: Abnormal   Collection Time: 02/09/23  3:12 AM  Result Value Ref Range   WBC 10.2 4.0 - 10.5 K/uL   RBC 4.30 3.87 - 5.11 MIL/uL   Hemoglobin 12.3 12.0 - 15.0 g/dL   HCT  16.1 09.6 - 04.5 %   MCV 92.6 80.0 - 100.0 fL   MCH 28.6 26.0 - 34.0 pg   MCHC 30.9 30.0 - 36.0 g/dL   RDW 40.9 81.1 - 91.4 %  Platelets 271 150 - 400 K/uL   nRBC 0.0 0.0 - 0.2 %   Neutrophils Relative % 79 %   Neutro Abs 7.9 (H) 1.7 - 7.7 K/uL   Lymphocytes Relative 14 %   Lymphs Abs 1.4 0.7 - 4.0 K/uL   Monocytes Relative 6 %   Monocytes Absolute 0.7 0.1 - 1.0 K/uL   Eosinophils Relative 1 %   Eosinophils Absolute 0.1 0.0 - 0.5 K/uL   Basophils Relative 0 %   Basophils Absolute 0.0 0.0 - 0.1 K/uL   Immature Granulocytes 0 %   Abs Immature Granulocytes 0.03 0.00 - 0.07 K/uL  Basic metabolic panel     Status: Abnormal   Collection Time: 02/09/23  3:12 AM  Result Value Ref Range   Sodium 138 135 - 145 mmol/L   Potassium 4.2 3.5 - 5.1 mmol/L   Chloride 103 98 - 111 mmol/L   CO2 27 22 - 32 mmol/L   Glucose, Bld 140 (H) 70 - 99 mg/dL   BUN 28 (H) 8 - 23 mg/dL   Creatinine, Ser 7.82 (H) 0.44 - 1.00 mg/dL   Calcium 9.7 8.9 - 95.6 mg/dL   GFR, Estimated 26 (L) >60 mL/min   Anion gap 8 5 - 15    BMET Recent Labs    02/09/23 0312  NA 138  K 4.2  CL 103  CO2 27  GLUCOSE 140*  BUN 28*  CREATININE 2.12*  CALCIUM 9.7   PT/INR No results for input(s): "LABPROT", "INR" in the last 72 hours. ABG No results for input(s): "PHART", "HCO3" in the last 72 hours.  Invalid input(s): "PCO2", "PO2"  Studies/Results: CT Renal Stone Study  Result Date: 02/09/2023 CLINICAL DATA:  62 year old female with worsening abdominal pain, recently diagnosed with proximal left ureteral calculus. Urology appointment upcoming. EXAM: CT ABDOMEN AND PELVIS WITHOUT CONTRAST TECHNIQUE: Multidetector CT imaging of the abdomen and pelvis was performed following the standard protocol without IV contrast. RADIATION DOSE REDUCTION: This exam was performed according to the departmental dose-optimization program which includes automated exposure control, adjustment of the mA and/or kV according to patient size  and/or use of iterative reconstruction technique. COMPARISON:  CT Abdomen and Pelvis 02/02/2023, and earlier. FINDINGS: Lower chest: Stable, negative. Hepatobiliary: Negative, surgically absent gallbladder. Pancreas: Negative. Spleen: Negative. Adrenals/Urinary Tract: Normal adrenal glands. No convincing right nephrolithiasis, negative noncontrast right kidney and ureter. Persistent left hydronephrosis and hydroureter. Obstructing, oval up to 4 x 7 mm ureteral calculus now located about 4.5 cm distal to the left UVJ versus 2 cm previously. See coronal image 72. Degree of hydronephrosis, proximal hydroureter not significantly changed, but there is increased associated inflammatory stranding at the left renal hilum. Distal to that stone the left ureter is negative to the bladder. Small 3 mm left lower pole intrarenal calculus unchanged. Negative bladder, stable pelvic phleboliths. Stomach/Bowel: Decompressed large bowel from the splenic flexure distally. Mild sigmoid diverticulosis in the pelvis. Average transverse and right colon retained stool. Normal appendix series 3, image 60. No dilated small bowel. Negative terminal ileum. Noncontrast stomach and duodenum appear negative. No free air or free fluid. Vascular/Lymphatic: Normal caliber abdominal aorta. Mild Aortoiliac calcified atherosclerosis. No lymphadenopathy identified. Reproductive: Surgically absent uterus. Diminutive or absent ovaries. Other: No pelvis free fluid.  Chronic pelvic phleboliths are stable. Musculoskeletal: No acute osseous abnormality identified. Chronic lumbosacral junction disc degeneration with vacuum disc. IMPRESSION: 1. Obstructing up to 4 x 7 mm left ureteral calculus has migrated about 2.5 cm since 02/02/2023, now about  4.5 cm distal to the left UVJ. Unchanged moderate left hydronephrosis and proximal hydroureter, with mildly increased inflammatory stranding at the left renal hilum. Stable small left lower pole intrarenal calculus. 2.  No new abnormality in the noncontrast abdomen or pelvis. Electronically Signed   By: Odessa Fleming M.D.   On: 02/09/2023 04:19     Assessment/Plan: Left proximal stone with obstruction and AKI.  This is her second trip to the ER with minimal progression of the stone.   I discussed options including continued MET with the addition of tamsulosin, Ureteroscopy or possible ESWL although the stone was not easily seen on the scout view.   She is interested in having ureteroscopy today.  Risks of bleeding, infection, ureteral injury, need for a stent or secondary procedures, thrombotic events and anesthetic complications reviewed.  I will get her transferred to [facility] for the procedure.         No follow-ups on file.    CC: Dr. Vonita Moss.      Bjorn Pippin 02/09/2023

## 2023-02-09 NOTE — ED Triage Notes (Signed)
Pt states that she was seen last week and had CT with 6mm stone, has appt with urology on Wed, pain is unbearable at home

## 2023-02-09 NOTE — Anesthesia Postprocedure Evaluation (Signed)
Anesthesia Post Note  Patient: Tara Holland  Procedure(s) Performed: CYSTOSCOPY/URETEROSCOPY/HOLMIUM LASER/STENT PLACEMENT (Left: Ureter)     Patient location during evaluation: PACU Anesthesia Type: General Level of consciousness: awake Pain management: pain level controlled Vital Signs Assessment: post-procedure vital signs reviewed and stable Respiratory status: spontaneous breathing, nonlabored ventilation and respiratory function stable Cardiovascular status: blood pressure returned to baseline and stable Postop Assessment: no apparent nausea or vomiting Anesthetic complications: no   No notable events documented.  Last Vitals:  Vitals:   02/09/23 1315 02/09/23 1330  BP: (!) 146/88 (!) 147/85  Pulse: (!) 52 (!) 50  Resp: 12 12  Temp:  (!) 36.2 C  SpO2: 100% 100%    Last Pain:  Vitals:   02/09/23 1330  TempSrc:   PainSc: 0-No pain                 Linton Rump

## 2023-02-09 NOTE — Op Note (Signed)
Procedure: 1.  Cystoscopy with left retrograde pyelogram and interpretation.  2.  Left ureteroscopy with holmium laser application, stone extraction and insertion of left double-J stent. 3.  Application of fluoroscopy.  Preop diagnosis: 4 x 7 mm left proximal ureteral stone.  Postop diagnosis: Same with small left lower pole stone as well.  Cystoscopy  Surgeon: Dr. Bjorn Pippin.  Anesthesia: General.  Specimen: Stone fragments.  Drains: 6 French by 26 cm left contour double-J stent with tether.  EBL: None.  Complications: None.  Indications: The patient is a 62 year old female who was seen in the emergency room this morning for the second time in a week for severe left flank pain from a 4 x 7 mm left proximal ureteral stone.  She was noted to have AKI with a creatinine of 2.12.  It was felt after review of the options that ureteroscopy was most appropriate treatment for at this time.  Procedure: She had been given Ancef in the ER.  A general anesthetic was induced.  She was placed in lithotomy position and fitted with PAS hose.  Her perineum and genitalia were prepped with Betadine solution she was draped in usual sterile fashion.  Cystoscopy was performed using a 21 [place] scope and 30 degree lens.  Examination really normal urethra.  The bladder wall was smooth and pale without tumors, stones or inflammation.  Ureteral orifices were unremarkable.  The left ureteral orifice was cannulated with 5 [place] open-ended catheter and contrast was instilled.  Left retrograde pyelogram revealed a normal caliber ureter up to the proximal ureteral stone which left a filling defect with mild proximal dilation.  A sensor wire was then advanced the kidney under fluoroscopic guidance and the open-ended catheter was removed along the cystoscope.  An 1 French by 36 cm access sheath inner core was then passed over the wire easily to the level of the stone.  This was then followed by the assembled 11/13  J[place] sheath.  Once it was appropriately positioned, the inner core and wire were removed leaving only the sheath.  The dual-lumen digital flexible scope was then assembled and inserted and the stone was initially not noted within the ureter but I found it in the upper pole of the kidney and there was a smaller stone in the lower pole of the kidney.  The 242 m holmium laser fiber was then passed and both stones were fragmented into manageable pieces with the laser set on the dusting setting with 0.3 J on the left pedal and 0.8 J on the right pedal.  Once the stone was adequately fragmented, an engage basket was used to remove all significant fragments.  Final inspection revealed no residual stones.  A sensor wire was advanced through the scope to the kidney and the scope was backed out under visual inspection.  No ureteral injury was noted.  The cystoscope was reinserted over the wire and a 6 [place] by 26 cm contour double-J stent with tether was advanced kidney under fluoroscopic guidance.  Wire was removed, leaving a good coil in the kidney and a good coil in the bladder.  The bladder was drained and the cystoscope was removed leaving the stent string exiting the urethra.  The string was tied near the meatus and trimmed to an appropriate length before being taught vaginally.  She was taken down from lithotomy position, her anesthetic was reversed and she was moved to recovery in stable condition.  There were no complications.

## 2023-02-09 NOTE — Anesthesia Procedure Notes (Signed)
Procedure Name: LMA Insertion Date/Time: 02/09/2023 12:02 PM  Performed by: Elyn Peers, CRNAPre-anesthesia Checklist: Patient identified, Emergency Drugs available, Suction available, Patient being monitored and Timeout performed Patient Re-evaluated:Patient Re-evaluated prior to induction Oxygen Delivery Method: Circle system utilized Preoxygenation: Pre-oxygenation with 100% oxygen Induction Type: IV induction Ventilation: Mask ventilation without difficulty LMA: LMA inserted LMA Size: 4.0 Number of attempts: 1 Placement Confirmation: positive ETCO2 and breath sounds checked- equal and bilateral Tube secured with: Tape Dental Injury: Teeth and Oropharynx as per pre-operative assessment

## 2023-02-09 NOTE — Discharge Instructions (Signed)
You may remove the stent on Tuesday morning by pulling the attached string that is tucked vaginally.  If you don't feel you can do that, please call the office to set up a time to remove it.

## 2023-02-09 NOTE — Anesthesia Preprocedure Evaluation (Addendum)
Anesthesia Evaluation  Patient identified by MRN, date of birth, ID band Patient awake    Reviewed: Allergy & Precautions, NPO status , Patient's Chart, lab work & pertinent test results  History of Anesthesia Complications Negative for: history of anesthetic complications  Airway Mallampati: III  TM Distance: >3 FB Neck ROM: Full    Dental  (+) Dental Advisory Given,    Pulmonary neg pulmonary ROS   Pulmonary exam normal breath sounds clear to auscultation       Cardiovascular hypertension (amlodipine-benazepril), Pt. on medications (-) angina (-) Past MI, (-) Cardiac Stents and (-) CABG (-) dysrhythmias  Rhythm:Regular Rate:Normal  HLD   Neuro/Psych negative neurological ROS     GI/Hepatic Neg liver ROS,GERD  ,,  Endo/Other  Pre-diabetes  Renal/GU negative Renal ROS     Musculoskeletal   Abdominal  (+) + obese  Peds  Hematology negative hematology ROS (+)   Anesthesia Other Findings 62 yo female who presents to the ER today with severe left flank pain with N/V that was not relieved by tramadol and home.  She was initially seen for a 4x16mm left proximal stone on 02/02/23.   A repeat CT today shows only slight progression of the stone but persistent left hydro and she has AKI with a Cr of 2.12.  Her UA has no signs of infection and she is afebrile.  She has gotten relief of her pain with percocet in the ER.    Reproductive/Obstetrics                             Anesthesia Physical Anesthesia Plan  ASA: 2  Anesthesia Plan: General   Post-op Pain Management:    Induction: Intravenous  PONV Risk Score and Plan: 3  Airway Management Planned: LMA  Additional Equipment:   Intra-op Plan:   Post-operative Plan: Extubation in OR  Informed Consent: I have reviewed the patients History and Physical, chart, labs and discussed the procedure including the risks, benefits and alternatives for  the proposed anesthesia with the patient or authorized representative who has indicated his/her understanding and acceptance.     Dental advisory given  Plan Discussed with: CRNA and Anesthesiologist  Anesthesia Plan Comments: (Risks of general anesthesia discussed including, but not limited to, sore throat, hoarse voice, chipped/damaged teeth, injury to vocal cords, nausea and vomiting, allergic reactions, lung infection, heart attack, stroke, and death. All questions answered. )       Anesthesia Quick Evaluation

## 2023-02-10 ENCOUNTER — Encounter (HOSPITAL_COMMUNITY): Payer: Self-pay | Admitting: Urology

## 2023-03-01 ENCOUNTER — Other Ambulatory Visit: Payer: Self-pay | Admitting: Family Medicine

## 2023-03-01 DIAGNOSIS — N63 Unspecified lump in unspecified breast: Secondary | ICD-10-CM | POA: Insufficient documentation

## 2023-03-01 DIAGNOSIS — Z87442 Personal history of urinary calculi: Secondary | ICD-10-CM | POA: Insufficient documentation

## 2023-03-01 DIAGNOSIS — F411 Generalized anxiety disorder: Secondary | ICD-10-CM | POA: Insufficient documentation

## 2023-03-01 DIAGNOSIS — Z1231 Encounter for screening mammogram for malignant neoplasm of breast: Secondary | ICD-10-CM

## 2023-07-01 ENCOUNTER — Ambulatory Visit
Admission: EM | Admit: 2023-07-01 | Discharge: 2023-07-01 | Disposition: A | Discharge status: Home / Self Care | Payer: Medicaid Other | Attending: Physician Assistant | Admitting: Physician Assistant

## 2023-07-01 DIAGNOSIS — K0510 Chronic gingivitis, plaque induced: Secondary | ICD-10-CM | POA: Diagnosis not present

## 2023-07-01 MED ORDER — AMOXICILLIN-POT CLAVULANATE 875-125 MG PO TABS: 1.0000 | ORAL_TABLET | Freq: Two times a day (BID) | ORAL | 0 refills | Status: AC

## 2023-07-01 NOTE — ED Provider Notes (Addendum)
[name]    CSN: [id number] Arrival date & time: 07/01/23  1042      History   Chief Complaint No chief complaint on file.   HPI Tara Holland is a 62 y.o. female.   Patient complains of a small bump to her left lower gums, located where the tooth was pulled.  She reports no pain to the area.  She states she poked it with a needle last night and only blood came out.  She has been using salt water gargles.  She made an appoint with her dentist but is for few weeks away.    Past Medical History:  Diagnosis Date   GERD (gastroesophageal reflux disease)    Hypertension    MMT (medial meniscus tear)    right knee   Pain in joint, hand    Pre-diabetes    Pure hypercholesterolemia    Symptomatic mammary hypertrophy    Vaginal irritation     Patient Active Problem List   Diagnosis Date Noted   Acute cholecystitis 07/15/2021   Symptomatic mammary hypertrophy 02/12/2019   Neck pain 02/12/2019   DM (diabetes mellitus), type 2 (HCC) 11/28/2017   Facial droop 11/27/2017   Malignant hypertension 11/27/2017   Hyperglycemia 11/27/2017   Abdominal pain 05/24/2016   Spasm of vocal cords 11/11/2015   Back pain 04/21/2009   VENTRICULAR HYPERTROPHY, LEFT 02/17/2009   OBESITY 02/02/2009   Essential hypertension 01/04/2009   HYPERCHOLESTEROLEMIA 02/06/2008    Past Surgical History:  Procedure Laterality Date   BREAST LUMPECTOMY     BREAST REDUCTION SURGERY Bilateral 04/23/2019   Procedure: MAMMARY REDUCTION  (BREAST);  Surgeon: Peggye Form, DO;  Location: [facility];  Service: Plastics;  Laterality: Bilateral;   CHOLECYSTECTOMY N/A 07/16/2021   Procedure: LAPAROSCOPIC CHOLECYSTECTOMY WITH INTRAOPERATIVE CHOLANGIOGRAM;  Surgeon: Berna Bue, MD;  Location: [facility];  Service: General;  Laterality: N/A;   CYSTOSCOPY/URETEROSCOPY/HOLMIUM LASER/STENT PLACEMENT Left 02/09/2023   Procedure: CYSTOSCOPY/URETEROSCOPY/HOLMIUM LASER/STENT PLACEMENT;   Surgeon: Bjorn Pippin, MD;  Location: [facility];  Service: Urology;  Laterality: Left;   VAGINAL HYSTERECTOMY      OB History   No obstetric history on file.      Home Medications    Prior to Admission medications   Medication Sig Start Date End Date Taking? Authorizing Provider  amoxicillin-clavulanate (AUGMENTIN) 875-125 MG tablet Take 1 tablet by mouth 2 (two) times daily for 5 days. 07/01/23 07/06/23 Yes Ward, Tylene Fantasia, PA-C  amLODipine-benazepril (LOTREL) 10-20 MG capsule Take 1 capsule by mouth daily. 01/18/23 04/18/23  Theadora Rama Scales, PA-C  Multiple Vitamin (MULTI-VITAMIN DAILY PO) Take 15 mLs by mouth every morning. Patient not taking: Reported on 02/02/2023    [provider]  ondansetron (ZOFRAN) 4 MG tablet Take 1 tablet (4 mg total) by mouth every 6 (six) hours as needed for nausea or vomiting. 02/09/23   Bjorn Pippin, MD  ondansetron (ZOFRAN-ODT) 4 MG disintegrating tablet Take 1 tablet (4 mg total) by mouth every 8 (eight) hours as needed for nausea or vomiting. 02/02/23   Curatolo, Adam, DO  oxyCODONE-acetaminophen (PERCOCET) 5-325 MG tablet Take 1 tablet by mouth every 6 (six) hours as needed for severe pain. 02/09/23 02/09/24  Bjorn Pippin, MD  traMADol (ULTRAM) 50 MG tablet Take 1 tablet (50 mg total) by mouth every 6 (six) hours as needed. 02/02/23   Virgina Norfolk, DO    Family History Family History  Problem Relation Age of Onset   Hypertension Mother  Kidney disease Mother    CVA Mother 78   Kidney disease Brother     Social History Social History   Tobacco Use   Smoking status: Never   Smokeless tobacco: Never  Vaping Use   Vaping status: Never Used  Substance Use Topics   Alcohol use: No   Drug use: No     Allergies   Sulfa antibiotics, Sulfonamide derivatives, Sucralfate, and Wound dressing adhesive   Review of Systems Review of Systems  Constitutional:  Negative for chills and fever.  HENT:  Negative for ear pain and sore throat.    Eyes:  Negative for pain and visual disturbance.  Respiratory:  Negative for cough and shortness of breath.   Cardiovascular:  Negative for chest pain and palpitations.  Gastrointestinal:  Negative for abdominal pain and vomiting.  Genitourinary:  Negative for dysuria and hematuria.  Musculoskeletal:  Negative for arthralgias and back pain.  Skin:  Negative for color change and rash.  Neurological:  Negative for seizures and syncope.  All other systems reviewed and are negative.    Physical Exam Triage Vital Signs ED Triage Vitals  Encounter Vitals Group     BP 07/01/23 1203 126/84     Systolic BP Percentile --      Diastolic BP Percentile --      Pulse Rate 07/01/23 1203 61     Resp 07/01/23 1203 16     Temp 07/01/23 1203 98.3 F (36.8 C)     Temp Source 07/01/23 1203 Oral     SpO2 07/01/23 1203 96 %     Weight --      Height --      Head Circumference --      Peak Flow --      Pain Score 07/01/23 1208 0     Pain Loc --      Pain Education --      Exclude from Growth Chart --    No data found.  Updated Vital Signs BP 126/84 (BP Location: Left Arm)   Pulse 61   Temp 98.3 F (36.8 C) (Oral)   Resp 16   SpO2 96%   Visual Acuity Right Eye Distance:   Left Eye Distance:   Bilateral Distance:    Right Eye Near:   Left Eye Near:    Bilateral Near:     Physical Exam Vitals and nursing note reviewed.  Constitutional:      General: She is not in acute distress.    Appearance: She is well-developed.  HENT:     Head: Normocephalic and atraumatic.     Mouth/Throat:     Comments: Small nonhard mobile skin tag to left lower gums where tooth has been extracted.  Nontender, no swelling or redness noted. Eyes:     Conjunctiva/sclera: Conjunctivae normal.  Cardiovascular:     Rate and Rhythm: Normal rate and regular rhythm.     Heart sounds: No murmur heard. Pulmonary:     Effort: Pulmonary effort is normal. No respiratory distress.     Breath sounds: Normal  breath sounds.  Abdominal:     Palpations: Abdomen is soft.     Tenderness: There is no abdominal tenderness.  Musculoskeletal:        General: No swelling.     Cervical back: Neck supple.  Skin:    General: Skin is warm and dry.     Capillary Refill: Capillary refill takes less than 2 seconds.  Neurological:     Mental  Status: She is alert.  Psychiatric:        Mood and Affect: Mood normal.      UC Treatments / Results  Labs (all labs ordered are listed, but only abnormal results are displayed) Labs Reviewed - No data to display  EKG   Radiology No results found.  Procedures Procedures (including critical care time)  Medications Ordered in UC Medications - No data to display  Initial Impression / Assessment and Plan / UC Course  I have reviewed the triage vital signs and the nursing notes.  Pertinent labs & imaging results that were available during my care of the patient were reviewed by me and considered in my medical decision making (see chart for details).     Pt advised to keep upcoming appointment with her dentist.  Advised to continue with salt water gargles.  Will place patient on antibiotic to cover for possible infection although patient is experiencing no pain at this time.  When she spoke to dentist dentist discussed that she could possibly have food stuck in the gums in that area. Final Clinical Impressions(s) / UC Diagnoses   Final diagnoses:  Gum inflammation     Discharge Instructions      Take antibiotic as prescribed Continue with salt water gargles Keep follow up with dentist    ED Prescriptions     Medication Sig Dispense Auth. Provider   amoxicillin-clavulanate (AUGMENTIN) 875-125 MG tablet Take 1 tablet by mouth 2 (two) times daily for 5 days. 10 tablet Ward, Tylene Fantasia, PA-C      PDMP not reviewed this encounter.   Ward, Tylene Fantasia, PA-C 07/01/23 1218    Ward, Tylene Fantasia, PA-C 07/01/23 1218

## 2023-07-01 NOTE — Discharge Instructions (Signed)
Take antibiotic as prescribed Continue with salt water gargles Keep follow up with dentist

## 2023-07-01 NOTE — ED Triage Notes (Signed)
Pt presents to UC w/ c/o growth on lower part of gums on the left side x4 days. Pt denies pain at the site. Pt states she "lanced it last night and it bled." Pt has been using warm salt water gargles.

## 2023-12-25 ENCOUNTER — Ambulatory Visit (HOSPITAL_BASED_OUTPATIENT_CLINIC_OR_DEPARTMENT_OTHER)

## 2023-12-25 ENCOUNTER — Ambulatory Visit (HOSPITAL_BASED_OUTPATIENT_CLINIC_OR_DEPARTMENT_OTHER): Admitting: Orthopaedic Surgery

## 2023-12-25 DIAGNOSIS — M25562 Pain in left knee: Secondary | ICD-10-CM | POA: Diagnosis not present

## 2023-12-25 DIAGNOSIS — M25561 Pain in right knee: Secondary | ICD-10-CM

## 2023-12-25 DIAGNOSIS — G8929 Other chronic pain: Secondary | ICD-10-CM

## 2023-12-25 NOTE — Progress Notes (Signed)
 Chief Complaint: Bilateral knee pain     History of Present Illness:    Tara Holland is a 63 y.o. female presents today with left worse than right knee pain which has been going on now for several months.  She did not have any specific injury.  She is status post left knee debridement and arthroscopy for which she underwent a meniscal debridement as well as chondral debridement.  She underwent physical therapy postoperatively.  She had a previous steroid injection twice in the left knee without any relief.  She is here today for further discussion.  Right knee is also been persistently painful when going up and down steps particularly deep in the patella tendon area.  She has undergone physical therapy as well as injections for this as well.    PMH/PSH/Family History/Social History/Meds/Allergies:    Past Medical History:  Diagnosis Date   GERD (gastroesophageal reflux disease)    Hypertension    MMT (medial meniscus tear)    right knee   Pain in joint, hand    Pre-diabetes    Pure hypercholesterolemia    Symptomatic mammary hypertrophy    Vaginal irritation    Past Surgical History:  Procedure Laterality Date   BREAST LUMPECTOMY     BREAST REDUCTION SURGERY Bilateral 04/23/2019   Procedure: MAMMARY REDUCTION  (BREAST);  Surgeon: Peggye Form, DO;  Location: [facility];  Service: Plastics;  Laterality: Bilateral;   CHOLECYSTECTOMY N/A 07/16/2021   Procedure: LAPAROSCOPIC CHOLECYSTECTOMY WITH INTRAOPERATIVE CHOLANGIOGRAM;  Surgeon: Berna Bue, MD;  Location: [facility];  Service: General;  Laterality: N/A;   CYSTOSCOPY/URETEROSCOPY/HOLMIUM LASER/STENT PLACEMENT Left 02/09/2023   Procedure: CYSTOSCOPY/URETEROSCOPY/HOLMIUM LASER/STENT PLACEMENT;  Surgeon: Bjorn Pippin, MD;  Location: [facility];  Service: Urology;  Laterality: Left;   VAGINAL HYSTERECTOMY     Social History   Socioeconomic History   Marital status: Single    Spouse name: Not on file    Number of children: Not on file   Years of education: Not on file   Highest education level: Not on file  Occupational History   Occupation: office worker  Tobacco Use   Smoking status: Never   Smokeless tobacco: Never  Vaping Use   Vaping status: Never Used  Substance and Sexual Activity   Alcohol use: No   Drug use: No   Sexual activity: Not on file  Other Topics Concern   Not on file  Social History Narrative   Not on file   Social Drivers of Health   Financial Resource Strain: Not at Risk (03/01/2023)   Received from [place], [employer]    Financial Resource Strain: 1  Food Insecurity: Not at Risk (03/01/2023)   Received from [place], [place]   Food Insecurity    Food: 1  Transportation Needs: Not at Risk (03/01/2023)   Received from [place], [employer] Needs    Transportation: 1  Physical Activity: Not on File (02/11/2023)   Received from [place], [place]   Physical Activity    Physical Activity: 0  Stress: Not on File (02/11/2023)   Received from [facility], [place]   Stress    Stress: 0  Social Connections: Not on File (06/12/2023)   Received from [facility]   Social Connections    Connectedness: 0   Family History  Problem Relation Age of Onset   Hypertension Mother    Kidney disease Mother    CVA Mother 40   Kidney disease Brother  Allergies  Allergen Reactions   Sulfa Antibiotics Itching    Severe itching   Sulfonamide Derivatives Itching    Severe itching   Sucralfate Rash   Wound Dressing Adhesive Rash   Current Outpatient Medications  Medication Sig Dispense Refill   amLODipine-benazepril (LOTREL) 10-20 MG capsule Take 1 capsule by mouth daily. 30 capsule 2   Multiple Vitamin (MULTI-VITAMIN DAILY PO) Take 15 mLs by mouth every morning. (Patient not taking: Reported on 02/02/2023)     ondansetron (ZOFRAN) 4 MG tablet Take 1 tablet (4 mg total) by mouth every 6 (six) hours as needed for nausea or vomiting. 10 tablet 1   ondansetron  (ZOFRAN-ODT) 4 MG disintegrating tablet Take 1 tablet (4 mg total) by mouth every 8 (eight) hours as needed for nausea or vomiting. 20 tablet 0   oxyCODONE-acetaminophen (PERCOCET) 5-325 MG tablet Take 1 tablet by mouth every 6 (six) hours as needed for severe pain. 15 tablet 0   traMADol (ULTRAM) 50 MG tablet Take 1 tablet (50 mg total) by mouth every 6 (six) hours as needed. 15 tablet 0   No current facility-administered medications for this visit.   No results found.  Review of Systems:   A ROS was performed including pertinent positives and negatives as documented in the HPI.  Physical Exam :   Constitutional: NAD and appears stated age Neurological: Alert and oriented Psych: Appropriate affect and cooperative There were no vitals taken for this visit.   Comprehensive Musculoskeletal Exam:    Left knee with tenderness about the lateral tibiofemoral joint.  Positive McMurray laterally.  Range of motion is from -3 130, negative Lachman, negative posterior drawer, sensation is intact to light touch throughout  Right knee with tenderness about the Hoffa's fat pad area.  No joint line tenderness.  Range of motion is from -3-130.  Negative Lachman, negative posterior drawer    Imaging:   Xray (4 views left knee, 4 views right knee): Left knee with mild sclerosis and peripheral osteophyte of the left lateral joint line, right knee is normal    I personally reviewed and interpreted the radiographs.   Assessment and Plan:   63 y.o. female with left knee lateral possible meniscal tearing and right knee what appears to be Hoffa's fat pad impingement.  Today's visit she has undergone physical therapy as well as injections for both knees.  Given this I would like to obtain an MRI of both knees and we can see her back to discuss results  [facility] for MRI of both knees and follow-up discuss results   I personally saw and evaluated the patient, and participated in the management and treatment  plan.  Huel Cote, MD Attending Physician, Orthopedic Surgery  This document was dictated using Dragon voice recognition software. A reasonable attempt at proof reading has been made to minimize errors.

## 2023-12-25 NOTE — Addendum Note (Signed)
 Addended by: Jeanella Cara on: 12/25/2023 09:32 AM   Modules accepted: Orders

## 2024-01-07 ENCOUNTER — Encounter (HOSPITAL_BASED_OUTPATIENT_CLINIC_OR_DEPARTMENT_OTHER): Payer: Self-pay | Admitting: Orthopaedic Surgery

## 2024-01-11 ENCOUNTER — Ambulatory Visit
Admission: RE | Admit: 2024-01-11 | Discharge: 2024-01-11 | Disposition: A | Discharge status: Home / Self Care | Source: Ambulatory Visit | Attending: Orthopaedic Surgery

## 2024-01-11 DIAGNOSIS — G8929 Other chronic pain: Secondary | ICD-10-CM

## 2024-01-16 ENCOUNTER — Encounter (HOSPITAL_BASED_OUTPATIENT_CLINIC_OR_DEPARTMENT_OTHER): Payer: Self-pay

## 2024-01-16 ENCOUNTER — Encounter (HOSPITAL_BASED_OUTPATIENT_CLINIC_OR_DEPARTMENT_OTHER): Admitting: Orthopaedic Surgery

## 2024-01-17 NOTE — Progress Notes (Signed)
 This encounter was created in error - please disregard.

## 2024-01-29 ENCOUNTER — Telehealth (HOSPITAL_BASED_OUTPATIENT_CLINIC_OR_DEPARTMENT_OTHER): Payer: Self-pay | Admitting: Orthopaedic Surgery

## 2024-01-29 NOTE — Telephone Encounter (Signed)
 Patient wants to know if she can have both of knees done at the same time so she want have to spend so much time out of work. Best contact [phone]

## 2024-02-11 ENCOUNTER — Telehealth (HOSPITAL_BASED_OUTPATIENT_CLINIC_OR_DEPARTMENT_OTHER): Payer: Self-pay | Admitting: Orthopaedic Surgery

## 2024-02-11 NOTE — Telephone Encounter (Signed)
 Patient states that she would rather get injections instead of the surgery. Please contact patient [phone]

## 2024-03-05 ENCOUNTER — Ambulatory Visit (HOSPITAL_BASED_OUTPATIENT_CLINIC_OR_DEPARTMENT_OTHER): Admitting: Orthopaedic Surgery

## 2024-03-11 ENCOUNTER — Telehealth (HOSPITAL_BASED_OUTPATIENT_CLINIC_OR_DEPARTMENT_OTHER): Payer: Self-pay | Admitting: Orthopaedic Surgery

## 2024-03-11 ENCOUNTER — Telehealth: Payer: Self-pay

## 2024-03-11 NOTE — Telephone Encounter (Signed)
 VOB submitted for Monovisc, bilateral knee

## 2024-03-11 NOTE — Telephone Encounter (Signed)
 Patient wants to make sure she is approved for Gel injections before she comes on 03/20/2024

## 2024-03-12 NOTE — Telephone Encounter (Signed)
This has been completed.  Thank you!

## 2024-03-20 ENCOUNTER — Ambulatory Visit (HOSPITAL_BASED_OUTPATIENT_CLINIC_OR_DEPARTMENT_OTHER): Admitting: Orthopaedic Surgery

## 2024-03-20 ENCOUNTER — Other Ambulatory Visit (HOSPITAL_BASED_OUTPATIENT_CLINIC_OR_DEPARTMENT_OTHER): Payer: Self-pay

## 2024-03-20 DIAGNOSIS — M25562 Pain in left knee: Secondary | ICD-10-CM | POA: Diagnosis not present

## 2024-03-20 DIAGNOSIS — M25561 Pain in right knee: Secondary | ICD-10-CM

## 2024-03-20 DIAGNOSIS — G8929 Other chronic pain: Secondary | ICD-10-CM | POA: Diagnosis not present

## 2024-03-20 MED ORDER — LIDOCAINE HCL 1 % IJ SOLN
4.0000 mL | INTRAMUSCULAR | Status: AC | PRN
  Administered 2024-03-20: 4 mL

## 2024-03-20 MED ORDER — TRIAMCINOLONE ACETONIDE 40 MG/ML IJ SUSP
80.0000 mg | INTRAMUSCULAR | Status: AC | PRN
  Administered 2024-03-20: 80 mg via INTRA_ARTICULAR

## 2024-03-20 MED ORDER — TRIAMCINOLONE ACETONIDE 40 MG/ML IJ SUSP
80.0000 mg | INTRAMUSCULAR | Status: AC | PRN
Start: 2024-03-20 — End: 2024-03-20
  Administered 2024-03-20: 80 mg via INTRA_ARTICULAR

## 2024-03-20 NOTE — Progress Notes (Signed)
 Chief Complaint: Bilateral knee pain     History of Present Illness:   03/20/2024: Presents today for follow-up of her bilateral knees.  She is also here for MRI discussion  SHAWNTAY PREST is a 63 y.o. female presents today with left worse than right knee pain which has been going on now for several months.  She did not have any specific injury.  She is status post left knee debridement and arthroscopy for which she underwent a meniscal debridement as well as chondral debridement.  She underwent physical therapy postoperatively.  She had a previous steroid injection twice in the left knee without any relief.  She is here today for further discussion.  Right knee is also been persistently painful when going up and down steps particularly deep in the patella tendon area.  She has undergone physical therapy as well as injections for this as well.    PMH/PSH/Family History/Social History/Meds/Allergies:    Past Medical History:  Diagnosis Date  . GERD (gastroesophageal reflux disease)   . Hypertension   . MMT (medial meniscus tear)    right knee  . Pain in joint, hand   . Pre-diabetes   . Pure hypercholesterolemia   . Symptomatic mammary hypertrophy   . Vaginal irritation    Past Surgical History:  Procedure Laterality Date  . BREAST LUMPECTOMY    . BREAST REDUCTION SURGERY Bilateral 04/23/2019   Procedure: MAMMARY REDUCTION  (BREAST);  Surgeon: Thornell Flirt, DO;  Location: [facility];  Service: Plastics;  Laterality: Bilateral;  . CHOLECYSTECTOMY N/A 07/16/2021   Procedure: LAPAROSCOPIC CHOLECYSTECTOMY WITH INTRAOPERATIVE CHOLANGIOGRAM;  Surgeon: Adalberto Acton, MD;  Location: [facility];  Service: General;  Laterality: N/A;  . CYSTOSCOPY/URETEROSCOPY/HOLMIUM LASER/STENT PLACEMENT Left 02/09/2023   Procedure: CYSTOSCOPY/URETEROSCOPY/HOLMIUM LASER/STENT PLACEMENT;  Surgeon: Homero Luster, MD;  Location: [facility];  Service: Urology;  Laterality: Left;  Aaron Aas VAGINAL  HYSTERECTOMY     Social History   Socioeconomic History  . Marital status: Single    Spouse name: Not on file  . Number of children: Not on file  . Years of education: Not on file  . Highest education level: Not on file  Occupational History  . Occupation: Film/video editor  Tobacco Use  . Smoking status: Never  . Smokeless tobacco: Never  Vaping Use  . Vaping status: Never Used  Substance and Sexual Activity  . Alcohol use: No  . Drug use: No  . Sexual activity: Not on file  Other Topics Concern  . Not on file  Social History Narrative  . Not on file   Social Drivers of Health   Financial Resource Strain: Not at Risk (03/01/2023)   Received from [employer]   . Financial Resource Strain: 1  Food Insecurity: Not at Risk (03/01/2023)   Received from [employer]   . Food: 1  Transportation Needs: Not at Risk (03/01/2023)   Received from [employer]   . Transportation: 1  Physical Activity: Not on File (02/11/2023)   Received from [facility]   Physical Activity   . Physical Activity: 0  Stress: Not on File (02/11/2023)   Received from [facility]   Stress   . Stress: 0  Social Connections: Not on File (06/12/2023)   Received from [employer]   . Connectedness: 0   Family History  Problem Relation Age of Onset  . Hypertension Mother   . Kidney disease  Mother   . CVA Mother 75  . Kidney disease Brother    Allergies  Allergen Reactions  . Sulfa Antibiotics Itching    Severe itching  . Sulfonamide Derivatives Itching    Severe itching  . Sucralfate  Rash  . Wound Dressing Adhesive Rash   Current Outpatient Medications  Medication Sig Dispense Refill  . amLODipine -benazepril  (LOTREL) 10-20 MG capsule Take 1 capsule by mouth daily. 30 capsule 2  . Multiple Vitamin (MULTI-VITAMIN DAILY PO) Take 15 mLs by mouth every morning. (Patient not taking: Reported on 02/02/2023)    . ondansetron  (ZOFRAN ) 4 MG tablet Take 1  tablet (4 mg total) by mouth every 6 (six) hours as needed for nausea or vomiting. 10 tablet 1  . ondansetron  (ZOFRAN -ODT) 4 MG disintegrating tablet Take 1 tablet (4 mg total) by mouth every 8 (eight) hours as needed for nausea or vomiting. 20 tablet 0  . traMADol  (ULTRAM ) 50 MG tablet Take 1 tablet (50 mg total) by mouth every 6 (six) hours as needed. 15 tablet 0   No current facility-administered medications for this visit.   No results found.  Review of Systems:   A ROS was performed including pertinent positives and negatives as documented in the HPI.  Physical Exam :   Constitutional: NAD and appears stated age Neurological: Alert and oriented Psych: Appropriate affect and cooperative There were no vitals taken for this visit.   Comprehensive Musculoskeletal Exam:    Left knee with tenderness about the lateral tibiofemoral joint.  Positive McMurray laterally.  Range of motion is from -3 130, negative Lachman, negative posterior drawer, sensation is intact to light touch throughout  Right knee with tenderness about the Hoffa's fat pad area.  No joint line tenderness.  Range of motion is from -3-130.  Negative Lachman, negative posterior drawer    Imaging:   Xray (4 views left knee, 4 views right knee): Left knee with mild sclerosis and peripheral osteophyte of the left lateral joint line, right knee is normal  MRI left knee, MRI right knee: She does unfortunately have full-thickness chondral loss involving the left knee in the medial lateral as well as patellofemoral joints.  Right knee shows evidence of lateral meniscal tearing at the anterior horn but still preserved tibiofemoral cartilage as well as patellofemoral cartilage  I personally reviewed and interpreted the radiographs.   Assessment and Plan:   63 y.o. female with left knee osteoarthritis and right knee lateral meniscal tearing.  At today's visit she has elected to pursue bilateral Monovisc injections which were  provided after verbal consent was obtained.  Given the fact that she has trialed injections as well as physical therapy in the past without relief we also did discuss briefly possibility of a right knee lateral meniscal repair.  I did discuss the risk limitations associated with this.  She would like to consider this and consider her options   -Plan for bilateral knee Monovisc injections, she will consider right knee arthroscopy with lateral meniscal repair    Procedure Note  Patient: MYKIAH SCHMUCK             Date of Birth: 09-04-1961           MRN: [id number]             Visit Date: 03/20/2024  Procedures: Visit Diagnoses:  1. Chronic pain of both knees     Large Joint Inj: R knee on 03/20/2024 12:18 PM Indications: pain Details: 22 G 1.5 in needle,  ultrasound-guided anterior approach  Arthrogram: No  Medications: 4 mL lidocaine  1 %; 80 mg triamcinolone acetonide 40 MG/ML Outcome: tolerated well, no immediate complications Procedure, treatment alternatives, risks and benefits explained, specific risks discussed. Consent was given by the patient. Immediately prior to procedure a time out was called to verify the correct patient, procedure, equipment, support staff and site/side marked as required. Patient was prepped and draped in the usual sterile fashion.    Large Joint Inj: L knee on 03/20/2024 12:18 PM Indications: pain Details: 22 G 1.5 in needle, ultrasound-guided anterior approach  Arthrogram: No  Medications: 4 mL lidocaine  1 %; 80 mg triamcinolone acetonide 40 MG/ML Outcome: tolerated well, no immediate complications Procedure, treatment alternatives, risks and benefits explained, specific risks discussed. Consent was given by the patient. Immediately prior to procedure a time out was called to verify the correct patient, procedure, equipment, support staff and site/side marked as required. Patient was prepped and draped in the usual sterile fashion.       After a  lengthy discussion of treatment options, including risks, benefits, alternatives, complications of surgical and nonsurgical conservative options, the patient elected surgical repair.   The patient  is aware of the material risks  and complications including, but not limited to injury to adjacent structures, neurovascular injury, infection, numbness, bleeding, implant failure, thermal burns, stiffness, persistent pain, failure to heal, disease transmission from allograft, need for further surgery, dislocation, anesthetic risks, blood clots, risks of death,and others. The probabilities of surgical success and failure discussed with patient given their particular co-morbidities.The time and nature of expected rehabilitation and recovery was discussed.The patient's questions were all answered preoperatively.  No barriers to understanding were noted. I explained the natural history of the disease process and Rx rationale.  I explained to the patient what I considered to be reasonable expectations given their personal situation.  The final treatment plan was arrived at through a shared patient decision making process model.    I personally saw and evaluated the patient, and participated in the management and treatment plan.  Wilhelmenia Harada, MD Attending Physician, Orthopedic Surgery  This document was dictated using Dragon voice recognition software. A reasonable attempt at proof reading has been made to minimize errors.

## 2024-03-23 ENCOUNTER — Other Ambulatory Visit: Payer: Self-pay

## 2024-03-23 DIAGNOSIS — G8929 Other chronic pain: Secondary | ICD-10-CM

## 2024-03-24 ENCOUNTER — Telehealth (HOSPITAL_BASED_OUTPATIENT_CLINIC_OR_DEPARTMENT_OTHER): Payer: Self-pay | Admitting: Orthopaedic Surgery

## 2024-03-24 NOTE — Telephone Encounter (Signed)
 Patient has questions about her injections and also patient wants to know if she can get a brace for her leg. Best contact [phone]

## 2024-03-25 ENCOUNTER — Other Ambulatory Visit (HOSPITAL_BASED_OUTPATIENT_CLINIC_OR_DEPARTMENT_OTHER): Payer: Self-pay | Admitting: Orthopaedic Surgery

## 2024-03-25 DIAGNOSIS — G8929 Other chronic pain: Secondary | ICD-10-CM

## 2024-03-25 NOTE — Telephone Encounter (Signed)
 Referral placed to PT

## 2024-04-16 ENCOUNTER — Telehealth (HOSPITAL_BASED_OUTPATIENT_CLINIC_OR_DEPARTMENT_OTHER): Payer: Self-pay | Admitting: Orthopaedic Surgery

## 2024-04-16 NOTE — Telephone Encounter (Signed)
 Patient wants to know Dr B can look at her knee to see if gel went all the way across her knee. Best phone number [phone]

## 2024-04-17 NOTE — Telephone Encounter (Signed)
 RC to patient asking if she had started with PT after last conversation and order. She said she just did some exercises and is still having IT band/quad tightness  I scheduled her a follow up appointment Aug 8th to go over knee again

## 2024-05-15 ENCOUNTER — Ambulatory Visit (HOSPITAL_BASED_OUTPATIENT_CLINIC_OR_DEPARTMENT_OTHER): Payer: Self-pay | Admitting: Orthopaedic Surgery

## 2024-05-15 ENCOUNTER — Ambulatory Visit (INDEPENDENT_AMBULATORY_CARE_PROVIDER_SITE_OTHER): Admitting: Orthopaedic Surgery

## 2024-05-15 ENCOUNTER — Encounter (HOSPITAL_BASED_OUTPATIENT_CLINIC_OR_DEPARTMENT_OTHER): Payer: Self-pay

## 2024-05-15 DIAGNOSIS — S83271A Complex tear of lateral meniscus, current injury, right knee, initial encounter: Secondary | ICD-10-CM | POA: Diagnosis not present

## 2024-05-15 MED ORDER — OXYCODONE HCL 5 MG PO TABS: 5.0000 mg | ORAL_TABLET | ORAL | 0 refills | Status: DC | PRN

## 2024-05-15 MED ORDER — IBUPROFEN 800 MG PO TABS: 800.0000 mg | ORAL_TABLET | Freq: Three times a day (TID) | ORAL | 0 refills | Status: AC

## 2024-05-15 MED ORDER — ASPIRIN 325 MG PO TBEC: 325.0000 mg | DELAYED_RELEASE_TABLET | Freq: Every day | ORAL | 0 refills | Status: DC

## 2024-05-15 MED ORDER — ACETAMINOPHEN 500 MG PO TABS
500.0000 mg | ORAL_TABLET | Freq: Three times a day (TID) | ORAL | 0 refills | Status: AC
Start: 2024-05-15 — End: 2024-05-25

## 2024-05-15 NOTE — Progress Notes (Signed)
 Chief Complaint: Bilateral knee pain     History of Present Illness:   05/15/2024: Presents today for follow-up of bilateral knees and discussion.  She is hoping to have any intervention possible on the left but is remaining hesitant to having any type of metal in the body.  Tara Holland is a 63 y.o. female presents today with left worse than right knee pain which has been going on now for several months.  She did not have any specific injury.  She is status post left knee debridement and arthroscopy for which she underwent a meniscal debridement as well as chondral debridement.  She underwent physical therapy postoperatively.  She had a previous steroid injection twice in the left knee without any relief.  She is here today for further discussion.  Right knee is also been persistently painful when going up and down steps particularly deep in the patella tendon area.  She has undergone physical therapy as well as injections for this as well.    PMH/PSH/Family History/Social History/Meds/Allergies:    Past Medical History:  Diagnosis Date   GERD (gastroesophageal reflux disease)    Hypertension    MMT (medial meniscus tear)    right knee   Pain in joint, hand    Pre-diabetes    Pure hypercholesterolemia    Symptomatic mammary hypertrophy    Vaginal irritation    Past Surgical History:  Procedure Laterality Date   BREAST LUMPECTOMY     BREAST REDUCTION SURGERY Bilateral 04/23/2019   Procedure: MAMMARY REDUCTION  (BREAST);  Surgeon: Lowery Estefana RAMAN, DO;  Location: [facility];  Service: Plastics;  Laterality: Bilateral;   CHOLECYSTECTOMY N/A 07/16/2021   Procedure: LAPAROSCOPIC CHOLECYSTECTOMY WITH INTRAOPERATIVE CHOLANGIOGRAM;  Surgeon: Signe Mitzie LABOR, MD;  Location: [facility];  Service: General;  Laterality: N/A;   CYSTOSCOPY/URETEROSCOPY/HOLMIUM LASER/STENT PLACEMENT Left 02/09/2023   Procedure: CYSTOSCOPY/URETEROSCOPY/HOLMIUM LASER/STENT PLACEMENT;  Surgeon:  Watt Rush, MD;  Location: [facility];  Service: Urology;  Laterality: Left;   VAGINAL HYSTERECTOMY     Social History   Socioeconomic History   Marital status: Single    Spouse name: Not on file   Number of children: Not on file   Years of education: Not on file   Highest education level: Not on file  Occupational History   Occupation: office worker  Tobacco Use   Smoking status: Never   Smokeless tobacco: Never  Vaping Use   Vaping status: Never Used  Substance and Sexual Activity   Alcohol use: No   Drug use: No   Sexual activity: Not on file  Other Topics Concern   Not on file  Social History Narrative   Not on file   Social Drivers of Health   Financial Resource Strain: Not at Risk (03/01/2023)   Received from [employer]    Financial Resource Strain: 1  Food Insecurity: Not at Risk (03/01/2023)   Received from [facility] Insecurity    Food: 1  Transportation Needs: Not at Risk (03/01/2023)   Received from [employer] Needs    Transportation: 1  Physical Activity: Not on File (02/11/2023)   Received from [facility]   Physical Activity    Physical Activity: 0  Stress: Not on File (02/11/2023)   Received from [facility]   Stress    Stress: 0  Social Connections: Not on File (06/12/2023)   Received from [employer]    Connectedness: 0  Family History  Problem Relation Age of Onset   Hypertension Mother    Kidney disease Mother    CVA Mother 53   Kidney disease Brother    Allergies  Allergen Reactions   Sulfa Antibiotics Itching    Severe itching   Sulfonamide Derivatives Itching    Severe itching   Sucralfate  Rash   Wound Dressing Adhesive Rash   Current Outpatient Medications  Medication Sig Dispense Refill   amLODipine -benazepril  (LOTREL) 10-20 MG capsule Take 1 capsule by mouth daily. 30 capsule 2   Multiple Vitamin (MULTI-VITAMIN DAILY PO) Take 15 mLs by mouth every morning. (Patient not taking: Reported on  02/02/2023)     ondansetron  (ZOFRAN ) 4 MG tablet Take 1 tablet (4 mg total) by mouth every 6 (six) hours as needed for nausea or vomiting. 10 tablet 1   ondansetron  (ZOFRAN -ODT) 4 MG disintegrating tablet Take 1 tablet (4 mg total) by mouth every 8 (eight) hours as needed for nausea or vomiting. 20 tablet 0   traMADol  (ULTRAM ) 50 MG tablet Take 1 tablet (50 mg total) by mouth every 6 (six) hours as needed. 15 tablet 0   No current facility-administered medications for this visit.   No results found.  Review of Systems:   A ROS was performed including pertinent positives and negatives as documented in the HPI.  Physical Exam :   Constitutional: NAD and appears stated age Neurological: Alert and oriented Psych: Appropriate affect and cooperative There were no vitals taken for this visit.   Comprehensive Musculoskeletal Exam:    Left knee with tenderness about the lateral tibiofemoral joint.  Positive McMurray laterally.  Range of motion is from -3 130, negative Lachman, negative posterior drawer, sensation is intact to light touch throughout  Right knee with tenderness about the Hoffa's fat pad area.  No joint line tenderness.  Range of motion is from -3-130.  Negative Lachman, negative posterior drawer    Imaging:   Xray (4 views left knee, 4 views right knee): Left knee with mild sclerosis and peripheral osteophyte of the left lateral joint line, right knee is normal  MRI left knee, MRI right knee: She does unfortunately have full-thickness chondral loss involving the left knee in the medial lateral as well as patellofemoral joints.  Right knee shows evidence of lateral meniscal tearing at the anterior horn but still preserved tibiofemoral cartilage as well as patellofemoral cartilage  I personally reviewed and interpreted the radiographs.   Assessment and Plan:   63 y.o. female with left knee osteoarthritis and right knee lateral meniscal tearing.  At today's visit I did describe  that given her left knee lack of cartilage about the lateral tibiofemoral joint space I would recommend an unloader brace which will help straighten the knee out.  With regard to the right knee I do believe she would continue to be a candidate for lateral meniscal repair.  I did describe that she does have some patellofemoral arthritis but as her symptoms do not appear to be emanating from this joint that I do believe she can be a candidate for lateral meniscal repair.  I did discuss risk limitations as well as associated recovery.  After discussion she would like to proceed  - Plan for right knee arthroscopy with lateral meniscal repair   After a lengthy discussion of treatment options, including risks, benefits, alternatives, complications of surgical and nonsurgical conservative options, the patient elected surgical repair.   The patient  is aware of the material risks  and complications including, but not limited to injury to adjacent structures, neurovascular injury, infection, numbness, bleeding, implant failure, thermal burns, stiffness, persistent pain, failure to heal, disease transmission from allograft, need for further surgery, dislocation, anesthetic risks, blood clots, risks of death,and others. The probabilities of surgical success and failure discussed with patient given their particular co-morbidities.The time and nature of expected rehabilitation and recovery was discussed.The patient's questions were all answered preoperatively.  No barriers to understanding were noted. I explained the natural history of the disease process and Rx rationale.  I explained to the patient what I considered to be reasonable expectations given their personal situation.  The final treatment plan was arrived at through a shared patient decision making process model.   I personally saw and evaluated the patient, and participated in the management and treatment plan.  Elspeth Parker, MD Attending Physician,  Orthopedic Surgery  This document was dictated using Dragon voice recognition software. A reasonable attempt at proof reading has been made to minimize errors.

## 2024-05-26 ENCOUNTER — Telehealth (HOSPITAL_BASED_OUTPATIENT_CLINIC_OR_DEPARTMENT_OTHER): Payer: Self-pay | Admitting: Orthopaedic Surgery

## 2024-05-26 NOTE — Telephone Encounter (Signed)
 Patient wants to know when her surgery will be sch. I transferred to April and also she said she supposed to be getting a brace for her other leg. Please advise

## 2024-06-12 ENCOUNTER — Telehealth: Payer: Self-pay | Admitting: Orthopaedic Surgery

## 2024-06-12 ENCOUNTER — Telehealth (HOSPITAL_BASED_OUTPATIENT_CLINIC_OR_DEPARTMENT_OTHER): Payer: Self-pay | Admitting: Orthopaedic Surgery

## 2024-06-12 NOTE — Telephone Encounter (Signed)
 I spoke with Tara Holland today and advised her we are still waiting on surgery clearance from her GP at [facility] to be able to schedule surgery. Patient will contact them today in regards to this. Also, patient still has not heard anything about getting a brace for her right knee. She is really struggling at work, and needs to get this brace to help her. Please advise.

## 2024-06-12 NOTE — Telephone Encounter (Signed)
 Returned call to patient informing knee brace information was submitted to insurance for authorization and I did touch base with Aleck to confirm. No further questions asked

## 2024-06-12 NOTE — Telephone Encounter (Signed)
 Patient wants brace for her knee

## 2024-06-15 ENCOUNTER — Ambulatory Visit: Admission: EM | Admit: 2024-06-15 | Discharge: 2024-06-15 | Discharge status: Against Medical Advice

## 2024-06-15 DIAGNOSIS — J069 Acute upper respiratory infection, unspecified: Secondary | ICD-10-CM

## 2024-06-15 DIAGNOSIS — Z5321 Procedure and treatment not carried out due to patient leaving prior to being seen by health care provider: Secondary | ICD-10-CM

## 2024-06-15 LAB — POC SOFIA SARS ANTIGEN FIA: SARS Coronavirus 2 Ag: NEGATIVE

## 2024-06-15 NOTE — ED Triage Notes (Signed)
 Patient left room to let us  know if the COVID19 test is Negative I am going home and getting in bed, it is just a virus, you can send my paperwork their.

## 2024-06-15 NOTE — ED Provider Notes (Signed)
 Patient left without being seen.    Billy Asberry FALCON, PA-C 06/15/24 1601

## 2024-06-15 NOTE — ED Triage Notes (Signed)
 Patient reports cough that started last night, I need to make sure it is not COVID19. No fever.

## 2024-07-06 ENCOUNTER — Telehealth (HOSPITAL_BASED_OUTPATIENT_CLINIC_OR_DEPARTMENT_OTHER): Payer: Self-pay | Admitting: Orthopaedic Surgery

## 2024-07-06 NOTE — Telephone Encounter (Signed)
 Patient wants to now when her surgery will be sch. I also gave her April number

## 2024-07-16 NOTE — Telephone Encounter (Signed)
 I spoke with the patient and advised her I have sent a surgical clearance request to her GP twice now with no response back. Patient to call her GP to request the form be signed and sent back so she can schedule surgery.

## 2024-07-28 ENCOUNTER — Encounter (HOSPITAL_COMMUNITY): Payer: Self-pay

## 2024-07-28 ENCOUNTER — Other Ambulatory Visit: Payer: Self-pay

## 2024-07-28 ENCOUNTER — Observation Stay (HOSPITAL_COMMUNITY)
Admission: EM | Admit: 2024-07-28 | Discharge: 2024-07-29 | Disposition: A | Discharge status: Home / Self Care | Attending: Internal Medicine | Admitting: Internal Medicine

## 2024-07-28 DIAGNOSIS — Z79899 Other long term (current) drug therapy: Secondary | ICD-10-CM | POA: Diagnosis not present

## 2024-07-28 DIAGNOSIS — K922 Gastrointestinal hemorrhage, unspecified: Principal | ICD-10-CM | POA: Insufficient documentation

## 2024-07-28 DIAGNOSIS — D509 Iron deficiency anemia, unspecified: Secondary | ICD-10-CM | POA: Diagnosis present

## 2024-07-28 DIAGNOSIS — Z8679 Personal history of other diseases of the circulatory system: Secondary | ICD-10-CM

## 2024-07-28 DIAGNOSIS — K625 Hemorrhage of anus and rectum: Secondary | ICD-10-CM | POA: Diagnosis present

## 2024-07-28 DIAGNOSIS — D500 Iron deficiency anemia secondary to blood loss (chronic): Secondary | ICD-10-CM | POA: Diagnosis not present

## 2024-07-28 DIAGNOSIS — E119 Type 2 diabetes mellitus without complications: Secondary | ICD-10-CM | POA: Insufficient documentation

## 2024-07-28 DIAGNOSIS — E785 Hyperlipidemia, unspecified: Secondary | ICD-10-CM | POA: Diagnosis not present

## 2024-07-28 DIAGNOSIS — E782 Mixed hyperlipidemia: Secondary | ICD-10-CM

## 2024-07-28 DIAGNOSIS — Z7901 Long term (current) use of anticoagulants: Secondary | ICD-10-CM | POA: Diagnosis not present

## 2024-07-28 DIAGNOSIS — Z7982 Long term (current) use of aspirin: Secondary | ICD-10-CM | POA: Insufficient documentation

## 2024-07-28 LAB — LIPASE, BLOOD: Lipase: 65 U/L — ABNORMAL HIGH (ref 11–51)

## 2024-07-28 LAB — CBC
HCT: 39.5 % (ref 36.0–46.0)
Hemoglobin: 11.9 g/dL — ABNORMAL LOW (ref 12.0–15.0)
MCH: 28.7 pg (ref 26.0–34.0)
MCHC: 30.1 g/dL (ref 30.0–36.0)
MCV: 95.2 fL (ref 80.0–100.0)
Platelets: 207 K/uL (ref 150–400)
RBC: 4.15 MIL/uL (ref 3.87–5.11)
RDW: 13.4 % (ref 11.5–15.5)
WBC: 8.1 K/uL (ref 4.0–10.5)
nRBC: 0 % (ref 0.0–0.2)

## 2024-07-28 LAB — COMPREHENSIVE METABOLIC PANEL WITH GFR
ALT: 14 U/L (ref 0–44)
AST: 21 U/L (ref 15–41)
Albumin: 3.9 g/dL (ref 3.5–5.0)
Alkaline Phosphatase: 90 U/L (ref 38–126)
Anion gap: 11 (ref 5–15)
BUN: 22 mg/dL (ref 8–23)
CO2: 26 mmol/L (ref 22–32)
Calcium: 9.6 mg/dL (ref 8.9–10.3)
Chloride: 105 mmol/L (ref 98–111)
Creatinine, Ser: 1.08 mg/dL — ABNORMAL HIGH (ref 0.44–1.00)
GFR, Estimated: 57 mL/min — ABNORMAL LOW (ref >60)
Glucose, Bld: 128 mg/dL — ABNORMAL HIGH (ref 70–99)
Potassium: 3.8 mmol/L (ref 3.5–5.1)
Sodium: 143 mmol/L (ref 135–145)
Total Bilirubin: 0.3 mg/dL (ref 0.0–1.2)
Total Protein: 6.6 g/dL (ref 6.5–8.1)

## 2024-07-28 LAB — CBG MONITORING, ED: Glucose-Capillary: 96 mg/dL (ref 70–99)

## 2024-07-28 LAB — URINALYSIS, ROUTINE W REFLEX MICROSCOPIC
Bilirubin Urine: NEGATIVE
Glucose, UA: NEGATIVE mg/dL
Hgb urine dipstick: NEGATIVE
Ketones, ur: NEGATIVE mg/dL
Leukocytes,Ua: NEGATIVE
Nitrite: NEGATIVE
Protein, ur: NEGATIVE mg/dL
Specific Gravity, Urine: 1.016 (ref 1.005–1.030)
pH: 5 (ref 5.0–8.0)

## 2024-07-28 LAB — APTT: aPTT: 30 s (ref 24–36)

## 2024-07-28 LAB — PROTIME-INR
INR: 1 (ref 0.8–1.2)
Prothrombin Time: 13.6 s (ref 11.4–15.2)

## 2024-07-28 LAB — MAGNESIUM: Magnesium: 2.1 mg/dL (ref 1.7–2.4)

## 2024-07-28 LAB — ABO/RH: ABO/RH(D): O POS

## 2024-07-28 MED ORDER — LACTATED RINGERS IV SOLN: INTRAVENOUS | Status: AC

## 2024-07-28 MED ORDER — ACETAMINOPHEN 650 MG RE SUPP: 650.0000 mg | Freq: Four times a day (QID) | RECTAL | Status: DC | PRN

## 2024-07-28 MED ORDER — INSULIN ASPART 100 UNIT/ML IJ SOLN
0.0000 [IU] | Freq: Four times a day (QID) | INTRAMUSCULAR | Status: DC
  Filled 2024-07-28: qty 0.06

## 2024-07-28 MED ORDER — BISACODYL 5 MG PO TBEC
10.0000 mg | DELAYED_RELEASE_TABLET | Freq: Once | ORAL | Status: AC
  Administered 2024-07-28: 10 mg via ORAL
  Filled 2024-07-28: qty 2

## 2024-07-28 MED ORDER — ACETAMINOPHEN 325 MG PO TABS: 650.0000 mg | ORAL_TABLET | Freq: Four times a day (QID) | ORAL | Status: DC | PRN

## 2024-07-28 MED ORDER — SODIUM CHLORIDE 0.9 % IV SOLN: INTRAVENOUS | Status: DC

## 2024-07-28 MED ORDER — PEG 3350-KCL-NA BICARB-NACL 420 G PO SOLR
4000.0000 mL | Freq: Once | ORAL | Status: DC
  Filled 2024-07-28: qty 4000

## 2024-07-28 MED ORDER — ONDANSETRON HCL 4 MG/2ML IJ SOLN: 4.0000 mg | Freq: Four times a day (QID) | INTRAMUSCULAR | Status: DC | PRN

## 2024-07-28 MED ORDER — MELATONIN 3 MG PO TABS: 3.0000 mg | ORAL_TABLET | Freq: Every evening | ORAL | Status: DC | PRN

## 2024-07-28 MED ORDER — HYDRALAZINE HCL 20 MG/ML IJ SOLN: 10.0000 mg | INTRAMUSCULAR | Status: DC | PRN

## 2024-07-28 NOTE — ED Triage Notes (Signed)
 Patient had a colonoscopy last Tuesday. She said every time she goes to the bathroom (bowel movement or urinate) she has bright red blood. No blood thinners. Has abdominal cramping in the lower area.

## 2024-07-28 NOTE — ED Notes (Addendum)
 Pt stated they do not wish to take the polyethylene glycol prep due to bathroom not being easily accessible in ED. Offered bedside toilet in which pt refused. Stated she would take prep once she was upstairs in IP room with bathroom. Explained that due to bed unavailability that may not happen tonight and that it may delay when she is taken for procedure tomorrow. Pt stated understanding and reiterated they would like to wait until they are moved upstairs.  GI DO informed

## 2024-07-28 NOTE — ED Provider Notes (Signed)
 [place] EMERGENCY DEPARTMENT AT [facility] Provider Note   CSN: [id number] Arrival date & time: 07/28/24  1618     Patient presents with: Abdominal Pain   Tara Holland is a 63 y.o. female.   HPI 63 year old female presents with rectal bleeding.  Patient had a colonoscopy via at [facility] 1 week ago.  Had her first bowel movement on 10/17.  It was only blood.  She has been having blood whenever she has a BM ever since.  Occasionally she has seen some clots.  Occasionally she will have some abdominal cramping, usually first thing in the morning.  No abdominal pain at this time.  She is not on blood thinners.  She does not feel dizzy or lightheaded.  She reports that 2 polyps were removed, one of them was large.  Prior to Admission medications   Medication Sig Start Date End Date Taking? Authorizing Provider  amLODipine -benazepril  (LOTREL) 10-20 MG capsule Take 1 capsule by mouth daily. 01/18/23 06/15/24  Joesph Shaver Scales, PA-C  aspirin  EC 325 MG tablet Take 1 tablet (325 mg total) by mouth daily. 05/15/24   Genelle Standing, MD  atorvastatin  (LIPITOR) 20 MG tablet Take 20 mg by mouth. 03/05/23   [provider]  Blood Glucose Monitoring Suppl (ONE TOUCH ULTRA 2) w/Device KIT Use to check blood sugar as directed 03/29/20   [provider]  cephALEXin  (KEFLEX ) 500 MG capsule Take 500 mg by mouth.    [provider]  empagliflozin (JARDIANCE) 10 MG TABS tablet Take 10 mg by mouth. 03/05/23   [provider]  Ferrous Sulfate (IRON PO) Take 15 mLs by mouth.    [provider]  hydrOXYzine  (ATARAX ) 25 MG tablet Take 25 mg by mouth. 07/27/21   [provider]  Multiple Vitamins-Minerals (ONE DAILY CALCIUM /IRON) TABS Take 1 tablet by mouth daily.    [provider]  oxyCODONE  (ROXICODONE ) 5 MG immediate release tablet Take 1 tablet (5 mg total) by mouth every 4 (four) hours as needed for severe pain (pain  score 7-10) or breakthrough pain. 05/15/24   Genelle Standing, MD  pravastatin (PRAVACHOL) 10 MG tablet Take 10 mg by mouth at bedtime. 02/28/24   [provider]    Allergies: Sulfa antibiotics, Sulfonamide derivatives, Sucralfate , Wound dressing adhesive, and Wound dressings    Review of Systems  Gastrointestinal:  Positive for abdominal pain and blood in stool.  Neurological:  Negative for light-headedness.    Updated Vital Signs BP (!) 146/83   Pulse 65   Temp 98.5 F (36.9 C) (Oral)   Resp 19   Ht 5' 10 (1.778 m)   Wt 81.2 kg   SpO2 100%   BMI 25.68 kg/m   Physical Exam Vitals and nursing note reviewed.  Constitutional:      General: She is not in acute distress.    Appearance: She is well-developed. She is not ill-appearing or diaphoretic.  HENT:     Head: Normocephalic and atraumatic.  Cardiovascular:     Rate and Rhythm: Normal rate and regular rhythm.     Heart sounds: Normal heart sounds.  Pulmonary:     Effort: Pulmonary effort is normal.  Abdominal:     Palpations: Abdomen is soft.     Tenderness: There is no abdominal tenderness.  Skin:    General: Skin is warm and dry.  Neurological:     Mental Status: She is alert.     (all labs ordered are listed,  but only abnormal results are displayed) Labs Reviewed  LIPASE, BLOOD - Abnormal; Notable for the following components:      Result Value   Lipase 65 (*)    All other components within normal limits  COMPREHENSIVE METABOLIC PANEL WITH GFR - Abnormal; Notable for the following components:   Glucose, Bld 128 (*)    Creatinine, Ser 1.08 (*)    GFR, Estimated 57 (*)    All other components within normal limits  CBC - Abnormal; Notable for the following components:   Hemoglobin 11.9 (*)    All other components within normal limits  URINALYSIS, ROUTINE W REFLEX MICROSCOPIC    EKG: None  Radiology: No results found.   Procedures   Medications Ordered in the ED - No data to display                                   Medical Decision Making Amount and/or Complexity of Data Reviewed Labs: ordered.    Details: Mild anemia  Risk Decision regarding hospitalization.   Patient is stable and well-appearing.  Has a benign abdominal exam.  I discussed her case with GI on-call, Dr. Kriss, who recommends admission for colonoscopy in the morning.  She will order the bowel prep.  Patient is agreeable to this treatment plan.  Discussed with Dr. Marcene for admission.     Final diagnoses:  Rectal bleeding    ED Discharge Orders     None          Freddi Hamilton, MD 07/28/24 2030

## 2024-07-28 NOTE — H&P (Signed)
 History and Physical      Tara Holland FMW:[id number] DOB: 25-Nov-1960 DOA: 07/28/2024; DOS: 07/28/2024  PCP: Jerome Heron Ruth, PA-C  Patient coming from: home   I have personally briefly reviewed patient's old medical records in [facility]  Chief Complaint: Hematochezia  HPI: Tara Holland is a 63 y.o. female with medical history significant for essential hypertension, hyperlipidemia, type 2 diabetes mellitus, chronic iron deficiency anemia with baseline hemoglobin 11-13, who is admitted to [facility] on 07/28/2024 with acute lower gastrointestinal bleed after presenting from home to [facility] ED complaining of hematochezia.   The patient underwent colonoscopy as an outpatient through [facility] 1 week ago, on Tuesday, 07/21/2024, at which time 2 polyps were removed, 1 of which was reported to be large in nature.  Following the colonoscopy, she conveys that her first bowel movement occurred 3 days later, on Friday, 07/24/2024, at which time she noted bright red blood associated with her stool, and conveys that each of her ensuing bowel movements have been associated with similar hematochezia.  She notes that her stools have been well-formed, and absence of any diarrhea or melena.  Denies any associated nausea, vomiting, hematemesis.  Reports some mild crampy abdominal discomfort in the lower abdominal quadrants over the last 4 to 5 days, but denies any upper abdominal involvement of this nonradiating pain.  No associate with any subjective fever, chills, rigors, or generalized myalgias.  Denies any associated chest pain, shortness of breath, palpitations, diaphoresis, dizziness Presyncope or syncope.  No recent trauma.  She has a history of chronic iron deficiency anemia for which she is on daily oral iron supplementation at home, with baseline hemoglobin range noted to be 11-13.  She is on a full dose daily aspirin  at home, but otherwise no additional blood thinners  as an outpatient.  Denies any known history of underlying liver disease, nor any routine or recent consumption of alcohol. No routine use of nsaids.    ED Course:  Vital signs in the ED were notable for the following: Afebrile; rates in the 60s; soft blood pressures in the 140s to 170s; respiratory rate 17-19, oxygen saturation 100% on room air.  Labs were notable for the following: CMP was notable for the following: Potassium 3.8, carbon 26, 1.08, glucose 128, liver enzymes are within normal limits.  Lipase 65.  CBC notable for white cell count 8100, hemoglobin 11.9 associated Neuraceq/Norocarp properties as well as nonelevated RDW, relative to most recent prior hemoglobin of 12.3 in May 2024, platelet count 207.  Urinalysis notable for white blood cells, leukocyte esterase/nitrate negative, and negative for hemoglobin.  Per my interpretation, EKG in ED demonstrated the following: No EKG performed in the ED today.  Imaging in the ED, per corresponding formal radiology read, was notable for the following: No imaging performed in the ED today.  EDP discussed patient's case with on-call [facility] gastroenterology, Dr. Kriss, who conveyed that [facility] will formally consult and is planning for colonoscopy in the morning.  Dr. Kriss has placed orders for bowel prep overnight.   While in the ED, the following were administered: No medications or IV fluids administered in the ED thus far.  Subsequently, the patient was admitted for further evaluation management of presenting acute lower gastrointestinal bleed in the setting of recent polypectomy.     Review of Systems: As per HPI otherwise 10 point review of systems negative.   Past Medical History:  Diagnosis Date   GERD (gastroesophageal reflux  disease)    Hypertension    MMT (medial meniscus tear)    right knee   Pain in joint, hand    Pre-diabetes    Pure hypercholesterolemia    Symptomatic mammary hypertrophy    Vaginal irritation      Past Surgical History:  Procedure Laterality Date   BREAST LUMPECTOMY     BREAST REDUCTION SURGERY Bilateral 04/23/2019   Procedure: MAMMARY REDUCTION  (BREAST);  Surgeon: Lowery Estefana RAMAN, DO;  Location: [facility];  Service: Plastics;  Laterality: Bilateral;   CHOLECYSTECTOMY N/A 07/16/2021   Procedure: LAPAROSCOPIC CHOLECYSTECTOMY WITH INTRAOPERATIVE CHOLANGIOGRAM;  Surgeon: Signe Mitzie LABOR, MD;  Location: [facility];  Service: General;  Laterality: N/A;   CYSTOSCOPY/URETEROSCOPY/HOLMIUM LASER/STENT PLACEMENT Left 02/09/2023   Procedure: CYSTOSCOPY/URETEROSCOPY/HOLMIUM LASER/STENT PLACEMENT;  Surgeon: Watt Rush, MD;  Location: [facility];  Service: Urology;  Laterality: Left;   VAGINAL HYSTERECTOMY      Social History:  reports that she has never smoked. She has never used smokeless tobacco. She reports that she does not drink alcohol and does not use drugs.   Allergies  Allergen Reactions   Sulfa Antibiotics Itching    Severe itching  Other Reaction(s): Not available   Sulfonamide Derivatives Itching    Severe itching   Sucralfate  Rash   Wound Dressing Adhesive Rash   Wound Dressings Dermatitis    Family History  Problem Relation Age of Onset   Hypertension Mother    Kidney disease Mother    CVA Mother 45   Kidney disease Brother     Family history reviewed and not pertinent    Prior to Admission medications   Medication Sig Start Date End Date Taking? Authorizing Provider  amLODipine -benazepril  (LOTREL) 10-20 MG capsule Take 1 capsule by mouth daily. 01/18/23 06/15/24  Joesph Shaver Scales, PA-C  aspirin  EC 325 MG tablet Take 1 tablet (325 mg total) by mouth daily. 05/15/24   Genelle Standing, MD  atorvastatin  (LIPITOR) 20 MG tablet Take 20 mg by mouth. 03/05/23   [provider]  Blood Glucose Monitoring Suppl (ONE TOUCH ULTRA 2) w/Device KIT Use to check blood sugar as directed 03/29/20   [provider]  cephALEXin  (KEFLEX ) 500 MG  capsule Take 500 mg by mouth.    [provider]  empagliflozin (JARDIANCE) 10 MG TABS tablet Take 10 mg by mouth. 03/05/23   [provider]  Ferrous Sulfate (IRON PO) Take 15 mLs by mouth.    [provider]  hydrOXYzine  (ATARAX ) 25 MG tablet Take 25 mg by mouth. 07/27/21   [provider]  Multiple Vitamins-Minerals (ONE DAILY CALCIUM /IRON) TABS Take 1 tablet by mouth daily.    [provider]  oxyCODONE  (ROXICODONE ) 5 MG immediate release tablet Take 1 tablet (5 mg total) by mouth every 4 (four) hours as needed for severe pain (pain score 7-10) or breakthrough pain. 05/15/24   Genelle Standing, MD  pravastatin (PRAVACHOL) 10 MG tablet Take 10 mg by mouth at bedtime. 02/28/24   [provider]     Objective    Physical Exam: Vitals:   07/28/24 1625 07/28/24 1930 07/28/24 2008  BP: (!) 146/83 (!) 170/97   Pulse: 65 (!) 52   Resp: 19 17   Temp: 98.5 F (36.9 C)  98.7 F (37.1 C)  TempSrc: Oral    SpO2: 100% 100%   Weight: 81.2 kg    Height: 5' 10 (1.778 m)      General: appears to be stated age; alert,  oriented Skin: warm, dry, no rash Head:  AT/[place] Mouth:  Oral mucosa membranes appear dry, normal dentition Neck: supple; trachea midline Heart:  RRR; did not appreciate any M/R/G Lungs: CTAB, did not appreciate any wheezes, rales, or rhonchi Abdomen: + BS; soft, ND, NT Vascular: 2+ pedal pulses b/l; 2+ radial pulses b/l Extremities: no peripheral edema, no muscle wasting    Labs on Admission: I have personally reviewed following labs and imaging studies  CBC: Recent Labs  Lab 07/28/24 1653  WBC 8.1  HGB 11.9*  HCT 39.5  MCV 95.2  PLT 207   Basic Metabolic Panel: Recent Labs  Lab 07/28/24 1653  NA 143  K 3.8  CL 105  CO2 26  GLUCOSE 128*  BUN 22  CREATININE 1.08*  CALCIUM  9.6   GFR: Estimated Creatinine Clearance: 56.2 mL/min (A) (by C-G formula based on SCr of 1.08 mg/dL (H)). Liver Function  Tests: Recent Labs  Lab 07/28/24 1653  AST 21  ALT 14  ALKPHOS 90  BILITOT 0.3  PROT 6.6  ALBUMIN 3.9   Recent Labs  Lab 07/28/24 1653  LIPASE 65*   No results for input(s): AMMONIA in the last 168 hours. Coagulation Profile: No results for input(s): INR, PROTIME in the last 168 hours. Cardiac Enzymes: No results for input(s): CKTOTAL, CKMB, CKMBINDEX, TROPONINI in the last 168 hours. BNP (last 3 results) No results for input(s): PROBNP in the last 8760 hours. HbA1C: No results for input(s): HGBA1C in the last 72 hours. CBG: No results for input(s): GLUCAP in the last 168 hours. Lipid Profile: No results for input(s): CHOL, HDL, LDLCALC, TRIG, CHOLHDL, LDLDIRECT in the last 72 hours. Thyroid Function Tests: No results for input(s): TSH, T4TOTAL, FREET4, T3FREE, THYROIDAB in the last 72 hours. Anemia Panel: No results for input(s): VITAMINB12, FOLATE, FERRITIN, TIBC, IRON, RETICCTPCT in the last 72 hours. Urine analysis:    Component Value Date/Time   COLORURINE YELLOW 07/28/2024 1726   APPEARANCEUR CLEAR 07/28/2024 1726   LABSPEC 1.016 07/28/2024 1726   PHURINE 5.0 07/28/2024 1726   GLUCOSEU NEGATIVE 07/28/2024 1726   HGBUR NEGATIVE 07/28/2024 1726   HGBUR negative 11/14/2010 1143   BILIRUBINUR NEGATIVE 07/28/2024 1726   KETONESUR NEGATIVE 07/28/2024 1726   PROTEINUR NEGATIVE 07/28/2024 1726   UROBILINOGEN 0.2 11/14/2010 1143   NITRITE NEGATIVE 07/28/2024 1726   LEUKOCYTESUR NEGATIVE 07/28/2024 1726    Radiological Exams on Admission: No results found.    Assessment/Plan    Principal Problem:   Acute lower GI bleeding Active Problems:   DM2 (diabetes mellitus, type 2) (HCC)   History of essential hypertension   HLD (hyperlipidemia)   Chronic iron deficiency anemia      #) Acute lower GI bleed: The patient presents with 5 days of hematochezia, which appears to be on the basis of large  polypectomy that occurred at time of outpatient colonoscopy 3 days prior, on 07/21/2024 .  Most recent episode hematochezia occurred earlier today.  No evidence of melena or elevated BUN to suggest an upper gastrointestinal source.  Her hemoglobin appears to be baseline, as further detailed below.  She is noted to be on a daily full dose aspirin , but otherwise on no blood thinners as an outpatient.  Not on any blood thinners as an outpatient, including no aspirin . No reported history of alcohol abuse. No known history of liver disease to warrant SBP prophylaxis. Will check INR  .   Patient appears hemodynamically stable with normotensive/non-tachycardic vital signs thus far, and is  currently asymptomatic.   Given suspected lower GI source, there does not appear to be an indication for initiation of Protonix  drip.  No indication for PRBC transfusion at this time.  EDP discussed patient's case with on-call [facility] gastroenterology, Dr. Kriss, who conveyed that [facility] will formally consult and is planning for colonoscopy in the morning.  Dr. Kriss has placed orders for bowel prep overnight.     Plan: [facility] to formally consult, per [facility], will proceed with clear liquid diet until 4 AM, with orders to transition diet to n.p.o with sips/ice chips starting at 4 AM on 07/29/2024.  Per [facility], bowel prep in the form of Nulytely 4 L p.o. x 1 dose now as well as Dulcolax 10 mg p.o. x 1 dose now.  Refraining from pharmacologic DVT prophylaxis.  Holding home daily aspirin  for now.  Monitor on telemetry. Maintain at least 2 large bore IV's.  Add on INR, PTT.  Type and screen ordered.  Q4H H&H's have been ordered through 9 AM on 07/29/2024. Will closely monitor these ensuing Hgb levels and correlate these data points with the patient's overall clinical picture including vital signs to determine need for subsequent transfusion.  Next dose of daily oral iron supplementation.  Check preoperative  EKG.  Lactated Ringer 's at 24 cc/h x 12 hours.                   #) Chronic iron deficiency anemia: Documented history of such, a/w with baseline hgb range 11-13, with presenting hgb consistent with this range.  In the setting of presenting acute lower gastrointestinal bleed, will closely monitor ensuing hemoglobin trend with serial H&H checks, as outlined below.  She is on daily oral iron supplementation as an outpatient, although the next dose this will be held in the setting of impending colonoscopy, which is planned for the morning.   Plan: Repeat CBC in the morning.  Further evaluation management of acute lower gastrointestinal bleed, as above.  Every 4 hour H&H's ordered through 9 AM on 07/29/2024.  Hold next dose of oral iron supplementation, as above.  Check INR, PTT.                    #) Essential Hypertension: documented h/o such, with outpatient antihypertensive regimen including amlodipine , benazepril .  SBP's in the ED today: 140s to 170s mmHg. in the setting of presenting acute lower gastrointestinal bleed, we will hold next doses of home antihypertensive medications.  Plan: Close monitoring of subsequent BP via routine VS. hold home antihypertensive medications for now.  Monitor strict I's and O's and daily weights.  Monitor on telemetry.  As needed IV hydralazine for systolic blood pressure greater than 180 mmHg.                        #) Hyperlipidemia: documented h/o such. On pravastatin as outpatient.   Plan: In the setting of impending n.p.o. status, will hold home statin for now.                       #) Type 2 Diabetes Mellitus: documented history of such, with most recent hemoglobin A1c noted to be 6.9% when checked in February 2019. Home insulin regimen: None. Home oral hypoglycemic agents: Apical flows and. presenting blood sugar: 128.  Plan: In the setting of clear liquid diet followed by plan for  n.p.o. status in setting of impending colonoscopy, we will  proceed with every 6 hour Accu-Cheks with low dose SSI.  Add on hemoglobin A1c.  Hold home empagliflozin during this hospitalization.        DVT prophylaxis: SCD's   Code Status: Full code Family Communication: none Disposition Plan: Per Rounding Team Consults called: EDP discussed patient's case with on-call [facility]ology, Dr. Kriss, who conveyed that [facility] will formally consult and is planning for colonoscopy in the morning.  Dr. Kriss has placed orders for bowel prep overnight. ;  Admission status: obs    I SPENT GREATER THAN 75  MINUTES IN CLINICAL CARE TIME/MEDICAL DECISION-MAKING IN COMPLETING THIS ADMISSION.     Tara Holland Nashalie Sallis DO [facility] From 7PM - 7AM   07/28/2024, 8:21 PM

## 2024-07-29 DIAGNOSIS — K922 Gastrointestinal hemorrhage, unspecified: Secondary | ICD-10-CM | POA: Diagnosis not present

## 2024-07-29 LAB — HEMOGLOBIN A1C
Hgb A1c MFr Bld: 5.7 % — ABNORMAL HIGH (ref 4.8–5.6)
Mean Plasma Glucose: 116.89 mg/dL

## 2024-07-29 LAB — CBC WITH DIFFERENTIAL/PLATELET
Abs Immature Granulocytes: 0.02 K/uL (ref 0.00–0.07)
Basophils Absolute: 0 K/uL (ref 0.0–0.1)
Basophils Relative: 1 %
Eosinophils Absolute: 0.2 K/uL (ref 0.0–0.5)
Eosinophils Relative: 3 %
HCT: 40.1 % (ref 36.0–46.0)
Hemoglobin: 12.1 g/dL (ref 12.0–15.0)
Immature Granulocytes: 0 %
Lymphocytes Relative: 34 %
Lymphs Abs: 2.9 K/uL (ref 0.7–4.0)
MCH: 28.5 pg (ref 26.0–34.0)
MCHC: 30.2 g/dL (ref 30.0–36.0)
MCV: 94.6 fL (ref 80.0–100.0)
Monocytes Absolute: 0.6 K/uL (ref 0.1–1.0)
Monocytes Relative: 7 %
Neutro Abs: 4.7 K/uL (ref 1.7–7.7)
Neutrophils Relative %: 55 %
Platelets: 207 K/uL (ref 150–400)
RBC: 4.24 MIL/uL (ref 3.87–5.11)
RDW: 13.4 % (ref 11.5–15.5)
WBC: 8.5 K/uL (ref 4.0–10.5)
nRBC: 0 % (ref 0.0–0.2)

## 2024-07-29 LAB — COMPREHENSIVE METABOLIC PANEL WITH GFR
ALT: 14 U/L (ref 0–44)
AST: 22 U/L (ref 15–41)
Albumin: 4 g/dL (ref 3.5–5.0)
Alkaline Phosphatase: 82 U/L (ref 38–126)
Anion gap: 10 (ref 5–15)
BUN: 14 mg/dL (ref 8–23)
CO2: 27 mmol/L (ref 22–32)
Calcium: 9.5 mg/dL (ref 8.9–10.3)
Chloride: 106 mmol/L (ref 98–111)
Creatinine, Ser: 1.02 mg/dL — ABNORMAL HIGH (ref 0.44–1.00)
GFR, Estimated: >60 mL/min (ref >60)
Glucose, Bld: 88 mg/dL (ref 70–99)
Potassium: 3.6 mmol/L (ref 3.5–5.1)
Sodium: 144 mmol/L (ref 135–145)
Total Bilirubin: 0.5 mg/dL (ref 0.0–1.2)
Total Protein: 6.8 g/dL (ref 6.5–8.1)

## 2024-07-29 LAB — HEMOGLOBIN AND HEMATOCRIT, BLOOD
HCT: 37 % (ref 36.0–46.0)
Hemoglobin: 11.6 g/dL — ABNORMAL LOW (ref 12.0–15.0)

## 2024-07-29 LAB — CBG MONITORING, ED: Glucose-Capillary: 89 mg/dL (ref 70–99)

## 2024-07-29 LAB — MAGNESIUM: Magnesium: 2.2 mg/dL (ref 1.7–2.4)

## 2024-07-29 NOTE — Discharge Summary (Signed)
 Physician Discharge Summary  TORIANA SPONSEL FMW:[id number] DOB: 04-30-1961 DOA: 07/28/2024  PCP: Jerome Heron Ruth, PA-C  Admit date: 07/28/2024 Discharge date: 07/29/2024  Admitted From: Home Disposition: Home  Recommendations for Outpatient Follow-up:  Follow up with PCP in 1-2 weeks Follow-up with GI provider at [facility] clinic for postoperative bleeding from recent colonoscopy Please obtain CBC in one week to recheck hemoglobin  Home Health: No Equipment/Devices: None  Discharge Condition: Stable CODE STATUS: Full code Diet recommendation: Heart healthy diet  History of present illness:  Tara Holland is a 63 year old female with past medical history significant for HTN, HLD, prediabetes, GERD who presented to [facility] ED on 07/28/2024 with complaints of pain and loss bleeding per rectum.  Recently had colonoscopy with polyp removed.  Given the amount of bleeding she discussed with her GI provider who directed her to the hospital for further evaluation and management.  In the ED, temperature 98.5 F, HR 65, RR 19, BP 146/83, SpO2 100% on room air.  WBC 8.1, hemoglobin 11.9, platelet count 207.  Sodium 143, potassium 3.8, chloride 105, CO2 26, glucose 128, BUN 22, creatinine 1.08.  Lipase 65.  AST 21, ALT 14, total bilirubin 0.3.  INR 1.0.  Urinalysis unrevealing.  [place] gastroenterology was consulted.  [facility] consulted for admission for further evaluation and management of post colonoscopy bleeding.  Hospital course:  Lower GI bleed Concern for post polypectomy bleeding Patient presenting with lower GI bleeding following recent colonoscopy outpatient.  Followed by GI at the [facility] clinic.  Reported polyp removal.  Patient reported a large amount of liquid blood stool at home and discussed with her GI provider who directed her to the ED for further evaluation and management.  Baseline hemoglobin 11-13, hemoglobin at time of admission 11.9.  Patient was seen by [facility]  gastroenterology with recommendation of colonoscopy.  Patient declined utilizing bowel prep in the ED.  Otherwise patient's hemoglobin remained stable with no further bleeding noted during her hospitalization.  After further discussions with GI, patient desires to hold off on colonoscopy and follow-up with her GI provider outpatient given stability of her hemoglobin and no further bleeding while inpatient.  Patient is currently not on any anticoagulants, antiplatelets.  Recommend repeat CBC 1 week.  Patient given strict return instructions if any recurrence of bleeding.  Prediabetes Hemoglobin A1c 5.7.  Outpatient follow-up with PCP.  Essential hypertension Continue amlodipine -benazepril  10-20 mg p.o. daily.  Hyperlipidemia No longer on statin  Discharge Diagnoses:  Principal Problem:   Acute lower GI bleeding Active Problems:   DM2 (diabetes mellitus, type 2) (HCC)   History of essential hypertension   HLD (hyperlipidemia)   Chronic iron deficiency anemia    Discharge Instructions  Discharge Instructions     Call MD for:  difficulty breathing, headache or visual disturbances   Complete by: As directed    Call MD for:  extreme fatigue   Complete by: As directed    Call MD for:  persistant dizziness or light-headedness   Complete by: As directed    Call MD for:  persistant nausea and vomiting   Complete by: As directed    Call MD for:  severe uncontrolled pain   Complete by: As directed    Call MD for:  temperature >100.4   Complete by: As directed    Diet - low sodium heart healthy   Complete by: As directed    Increase activity slowly   Complete by: As directed  Allergies as of 07/29/2024       Reactions   Sulfa Antibiotics Itching   Severe itching Other Reaction(s): Not available   Sulfonamide Derivatives Itching   Severe itching   Sucralfate  Rash   Wound Dressing Adhesive Rash   Wound Dressings Dermatitis        Medication List     STOP taking  these medications    aspirin  EC 325 MG tablet   atorvastatin  20 MG tablet Commonly known as: LIPITOR   cephALEXin  500 MG capsule Commonly known as: KEFLEX    empagliflozin 10 MG Tabs tablet Commonly known as: JARDIANCE   hydrOXYzine  25 MG tablet Commonly known as: ATARAX    IRON PO   Na Sulfate-K Sulfate-Mg Sulfate concentrate 17.5-3.13-1.6 GM/177ML Soln Commonly known as: SUPREP   One Daily Calcium /Iron Tabs   ONE TOUCH ULTRA 2 w/Device Kit   oxyCODONE  5 MG immediate release tablet Commonly known as: Roxicodone    pravastatin 10 MG tablet Commonly known as: PRAVACHOL       TAKE these medications    amLODipine -benazepril  10-20 MG capsule Commonly known as: LOTREL Take 1 capsule by mouth daily.        Follow-up Information     Jerome Heron Ruth, PA-C. Schedule an appointment as soon as possible for a visit in 1 week(s).   Specialty: Physician Assistant Contact information: [address] [place], [place] [address] [phone]                Allergies  Allergen Reactions   Sulfa Antibiotics Itching    Severe itching  Other Reaction(s): Not available   Sulfonamide Derivatives Itching    Severe itching   Sucralfate  Rash   Wound Dressing Adhesive Rash   Wound Dressings Dermatitis    Consultations: [facility], Dr. Kriss   Procedures/Studies: No results found.   Subjective:  Patient seen examined bedside, holding in ED.  Patient declining to utilize bowel prep until she was transferred to the floor.  Hemoglobin is remained stable.  Patient has had multiple bowel movements during her hospitalization such far without any further recurrence of bleeding.  Seen by GI, Dr. Kriss this morning and patient wishes to discharge home and follow-up with her GI provider outpatient.  Once again colonoscopy was offered but she declined.  Patient no other specific complaints or concerns at this time.  Denies headache, no dizziness, no chest  pain, no palpitations, no shortness of breath, no abdominal pain, no fever/chills/night sweats, no nausea/vomiting/diarrhea, no focal weakness, no fatigue, no paresthesias.  No acute events overnight per nursing staff.  Discharge Exam: Vitals:   07/29/24 0800 07/29/24 1118  BP: (!) 174/78 (!) 154/108  Pulse: (!) 47 (!) 50  Resp: 15 15  Temp:  98.5 F (36.9 C)  SpO2: 100% 100%   Vitals:   07/29/24 0600 07/29/24 0650 07/29/24 0800 07/29/24 1118  BP: 133/86  (!) 174/78 (!) 154/108  Pulse: (!) 48  (!) 47 (!) 50  Resp: 12  15 15   Temp:  98.5 F (36.9 C)  98.5 F (36.9 C)  TempSrc:    Oral  SpO2: 98%  100% 100%  Weight:      Height:        Physical Exam: GEN: NAD, alert and oriented x 3, wd/wn HEENT: NCAT, PERRL, EOMI, sclera clear, MMM PULM: CTAB w/o wheezes/crackles, normal respiratory effort, room air CV: RRR w/o M/G/R GI: abd soft, NTND, + BS MSK: no peripheral edema, muscle strength globally intact 5/5 bilateral upper/lower extremities  NEURO: No focal neurological deficit PSYCH: normal mood/affect Integumentary: No concerning rashes/lesions/wounds noted on exposed skin surfaces    The results of significant diagnostics from this hospitalization (including imaging, microbiology, ancillary and laboratory) are listed below for reference.     Microbiology: No results found for this or any previous visit (from the past 240 hours).   Labs: BNP (last 3 results) No results for input(s): BNP in the last 8760 hours. Basic Metabolic Panel: Recent Labs  Lab 07/28/24 1653 07/29/24 0431  NA 143 144  K 3.8 3.6  CL 105 106  CO2 26 27  GLUCOSE 128* 88  BUN 22 14  CREATININE 1.08* 1.02*  CALCIUM  9.6 9.5  MG 2.1 2.2   Liver Function Tests: Recent Labs  Lab 07/28/24 1653 07/29/24 0431  AST 21 22  ALT 14 14  ALKPHOS 90 82  BILITOT 0.3 0.5  PROT 6.6 6.8  ALBUMIN 3.9 4.0   Recent Labs  Lab 07/28/24 1653  LIPASE 65*   No results for input(s): AMMONIA in the  last 168 hours. CBC: Recent Labs  Lab 07/28/24 1653 07/29/24 0023 07/29/24 0431  WBC 8.1  --  8.5  NEUTROABS  --   --  4.7  HGB 11.9* 11.6* 12.1  HCT 39.5 37.0 40.1  MCV 95.2  --  94.6  PLT 207  --  207   Cardiac Enzymes: No results for input(s): CKTOTAL, CKMB, CKMBINDEX, TROPONINI in the last 168 hours. BNP: Invalid input(s): POCBNP CBG: Recent Labs  Lab 07/28/24 2338 07/29/24 0606  GLUCAP 96 89   D-Dimer No results for input(s): DDIMER in the last 72 hours. Hgb A1c Recent Labs    07/28/24 2142  HGBA1C 5.7*   Lipid Profile No results for input(s): CHOL, HDL, LDLCALC, TRIG, CHOLHDL, LDLDIRECT in the last 72 hours. Thyroid function studies No results for input(s): TSH, T4TOTAL, T3FREE, THYROIDAB in the last 72 hours.  Invalid input(s): FREET3 Anemia work up No results for input(s): VITAMINB12, FOLATE, FERRITIN, TIBC, IRON, RETICCTPCT in the last 72 hours. Urinalysis    Component Value Date/Time   COLORURINE YELLOW 07/28/2024 1726   APPEARANCEUR CLEAR 07/28/2024 1726   LABSPEC 1.016 07/28/2024 1726   PHURINE 5.0 07/28/2024 1726   GLUCOSEU NEGATIVE 07/28/2024 1726   HGBUR NEGATIVE 07/28/2024 1726   HGBUR negative 11/14/2010 1143   BILIRUBINUR NEGATIVE 07/28/2024 1726   KETONESUR NEGATIVE 07/28/2024 1726   PROTEINUR NEGATIVE 07/28/2024 1726   UROBILINOGEN 0.2 11/14/2010 1143   NITRITE NEGATIVE 07/28/2024 1726   LEUKOCYTESUR NEGATIVE 07/28/2024 1726   Sepsis Labs Recent Labs  Lab 07/28/24 1653 07/29/24 0431  WBC 8.1 8.5   Microbiology No results found for this or any previous visit (from the past 240 hours).   Time coordinating discharge: Over 30 minutes  SIGNED:   Camellia PARAS [place], DO  [facility] 07/29/2024, 12:02 PM

## 2024-07-29 NOTE — Consult Note (Signed)
 [facility] Gastroenterology Consult  Referring Provider: No ref. provider found Primary Care Physician:  Jerome Heron Ruth, PA-C Primary Gastroenterologist: [facility]  Reason for Consultation: Hematochezia  SUBJECTIVE:   HPI: Tara Holland is a 63 y.o. female with past medical history significant for hypertension, prediabetes, GERD.  Had colonoscopy with [facility] medical on 07/21/2024.  Day after procedure she began to see painless blood per rectum.  She had progressive symptoms of rectal bleeding, did not receive callback from her gastroenterologist, presented to hospital for further evaluation.  Per patient report, she had 1 large polyp removed during colonoscopy and 1 small polyp removed.  She is not aware whether cautery was used.  No report available for my review.  Received consult for this patient overnight, given concern for post polypectomy bleeding a bowel preparation was ordered for her in anticipation of likely colonoscopy on 07/29/2024.  Patient declined bowel preparation while she was in the emergency department given concern for her ability to make it to the bathroom.  On my evaluation, she remains in the ER, is not willing to complete bowel preparation while in the emergency department.  She noted that she had few small nonbloody, green, bowel movements this morning.  She is interested in being discharged as she feels stable.  If she develops recurrent bleeding she would return to the emergency department.  Past Medical History:  Diagnosis Date   GERD (gastroesophageal reflux disease)    Hypertension    MMT (medial meniscus tear)    right knee   Pain in joint, hand    Pre-diabetes    Pure hypercholesterolemia    Symptomatic mammary hypertrophy    Vaginal irritation    Past Surgical History:  Procedure Laterality Date   BREAST LUMPECTOMY     BREAST REDUCTION SURGERY Bilateral 04/23/2019   Procedure: MAMMARY REDUCTION  (BREAST);  Surgeon: Lowery Estefana RAMAN, DO;   Location: [facility];  Service: Plastics;  Laterality: Bilateral;   CHOLECYSTECTOMY N/A 07/16/2021   Procedure: LAPAROSCOPIC CHOLECYSTECTOMY WITH INTRAOPERATIVE CHOLANGIOGRAM;  Surgeon: Signe Mitzie LABOR, MD;  Location: [facility];  Service: General;  Laterality: N/A;   CYSTOSCOPY/URETEROSCOPY/HOLMIUM LASER/STENT PLACEMENT Left 02/09/2023   Procedure: CYSTOSCOPY/URETEROSCOPY/HOLMIUM LASER/STENT PLACEMENT;  Surgeon: Watt Rush, MD;  Location: [facility];  Service: Urology;  Laterality: Left;   VAGINAL HYSTERECTOMY     Prior to Admission medications   Medication Sig Start Date End Date Taking? Authorizing Provider  amLODipine -benazepril  (LOTREL) 10-20 MG capsule Take 1 capsule by mouth daily. 01/18/23 07/28/24 Yes Joesph Shaver Scales, PA-C  aspirin  EC 325 MG tablet Take 1 tablet (325 mg total) by mouth daily. Patient not taking: Reported on 07/28/2024 05/15/24   Genelle Standing, MD  atorvastatin  (LIPITOR) 20 MG tablet Take 20 mg by mouth. Patient not taking: Reported on 07/28/2024 03/05/23   [provider]  Blood Glucose Monitoring Suppl (ONE TOUCH ULTRA 2) w/Device KIT Use to check blood sugar as directed Patient not taking: Reported on 07/28/2024 03/29/20   [provider]  cephALEXin  (KEFLEX ) 500 MG capsule Take 500 mg by mouth. Patient not taking: Reported on 07/28/2024    [provider]  empagliflozin (JARDIANCE) 10 MG TABS tablet Take 10 mg by mouth. Patient not taking: Reported on 07/28/2024 03/05/23   [provider]  Ferrous Sulfate (IRON PO) Take 15 mLs by mouth. Patient not taking: Reported on 07/28/2024    [provider]  hydrOXYzine  (ATARAX ) 25 MG tablet Take 25 mg by mouth. Patient not taking: Reported on  07/28/2024 07/27/21   [provider]  Multiple Vitamins-Minerals (ONE DAILY CALCIUM /IRON) TABS Take 1 tablet by mouth daily. Patient not taking: Reported on 07/28/2024    [provider]  Na Sulfate-K  Sulfate-Mg Sulfate concentrate (SUPREP) 17.5-3.13-1.6 GM/177ML SOLN Take 1 kit by mouth as directed. Patient not taking: Reported on 07/28/2024 07/13/24   [provider]  oxyCODONE  (ROXICODONE ) 5 MG immediate release tablet Take 1 tablet (5 mg total) by mouth every 4 (four) hours as needed for severe pain (pain score 7-10) or breakthrough pain. Patient not taking: Reported on 07/28/2024 05/15/24   Genelle Standing, MD  pravastatin (PRAVACHOL) 10 MG tablet Take 10 mg by mouth at bedtime. Patient not taking: Reported on 07/28/2024 02/28/24   [provider]   Current Facility-Administered Medications  Medication Dose Route Frequency Provider Last Rate Last Admin   acetaminophen  (TYLENOL ) tablet 650 mg  650 mg Oral Q6H PRN Howerter, Justin B, DO       Or   acetaminophen  (TYLENOL ) suppository 650 mg  650 mg Rectal Q6H PRN Howerter, Justin B, DO       hydrALAZINE (APRESOLINE) injection 10 mg  10 mg Intravenous Q4H PRN Howerter, Justin B, DO       insulin aspart (novoLOG) injection 0-6 Units  0-6 Units Subcutaneous Q6H Howerter, Justin B, DO       melatonin tablet 3 mg  3 mg Oral QHS PRN Howerter, Justin B, DO       ondansetron  (ZOFRAN ) injection 4 mg  4 mg Intravenous Q6H PRN Howerter, Justin B, DO       polyethylene glycol-electrolytes (NuLYTELY) solution 4,000 mL  4,000 mL Oral Once Kriss Estefana DEL, DO       Current Outpatient Medications  Medication Sig Dispense Refill   amLODipine -benazepril  (LOTREL) 10-20 MG capsule Take 1 capsule by mouth daily. 30 capsule 2   aspirin  EC 325 MG tablet Take 1 tablet (325 mg total) by mouth daily. (Patient not taking: Reported on 07/28/2024) 14 tablet 0   atorvastatin  (LIPITOR) 20 MG tablet Take 20 mg by mouth. (Patient not taking: Reported on 07/28/2024)     Blood Glucose Monitoring Suppl (ONE TOUCH ULTRA 2) w/Device KIT Use to check blood sugar as directed (Patient not taking: Reported on 07/28/2024)     cephALEXin  (KEFLEX ) 500 MG capsule  Take 500 mg by mouth. (Patient not taking: Reported on 07/28/2024)     empagliflozin (JARDIANCE) 10 MG TABS tablet Take 10 mg by mouth. (Patient not taking: Reported on 07/28/2024)     Ferrous Sulfate (IRON PO) Take 15 mLs by mouth. (Patient not taking: Reported on 07/28/2024)     hydrOXYzine  (ATARAX ) 25 MG tablet Take 25 mg by mouth. (Patient not taking: Reported on 07/28/2024)     Multiple Vitamins-Minerals (ONE DAILY CALCIUM /IRON) TABS Take 1 tablet by mouth daily. (Patient not taking: Reported on 07/28/2024)     Na Sulfate-K Sulfate-Mg Sulfate concentrate (SUPREP) 17.5-3.13-1.6 GM/177ML SOLN Take 1 kit by mouth as directed. (Patient not taking: Reported on 07/28/2024)     oxyCODONE  (ROXICODONE ) 5 MG immediate release tablet Take 1 tablet (5 mg total) by mouth every 4 (four) hours as needed for severe pain (pain score 7-10) or breakthrough pain. (Patient not taking: Reported on 07/28/2024) 10 tablet 0   pravastatin (PRAVACHOL) 10 MG tablet Take 10 mg by mouth at bedtime. (Patient not taking: Reported on 07/28/2024)     Allergies as of 07/28/2024 - Review Complete 07/28/2024  Allergen Reaction Noted  Sulfa antibiotics Itching 01/04/2009   Sulfonamide derivatives Itching 01/04/2009   Sucralfate  Rash 07/27/2021   Wound dressing adhesive Rash 02/02/2023   Wound dressings Dermatitis 02/02/2023   Family History  Problem Relation Age of Onset   Hypertension Mother    Kidney disease Mother    CVA Mother 78   Kidney disease Brother    Social History   Socioeconomic History   Marital status: Single    Spouse name: Not on file   Number of children: Not on file   Years of education: Not on file   Highest education level: Not on file  Occupational History   Occupation: Film/video editor  Tobacco Use   Smoking status: Never   Smokeless tobacco: Never  Vaping Use   Vaping status: Never Used  Substance and Sexual Activity   Alcohol use: No   Drug use: No   Sexual activity: Not on file   Other Topics Concern   Not on file  Social History Narrative   Not on file   Social Drivers of Health   Financial Resource Strain: Not at Risk (03/01/2023)   Received from [employer]    Financial Resource Strain: 1  Food Insecurity: Not at Risk (03/01/2023)   Received from [facility] Insecurity    Food: 1  Transportation Needs: Not at Risk (03/01/2023)   Received from [employer] Needs    Transportation: 1  Physical Activity: Not on File (02/11/2023)   Received from [facility]   Physical Activity    Physical Activity: 0  Stress: Not on File (02/11/2023)   Received from [facility]   Stress    Stress: 0  Social Connections: Not on File (06/12/2023)   Received from [facility]   Social Connections    Connectedness: 0  Intimate Partner Violence: Unknown (01/11/2022)   Received from [facility]   HITS    Physically Hurt: Not on file    Insult or Talk Down To: Not on file    Threaten Physical Harm: Not on file    Scream or Curse: Not on file   Review of Systems:  Review of Systems  Respiratory:  Negative for shortness of breath.   Cardiovascular:  Negative for chest pain.  Gastrointestinal:  Positive for blood in stool. Negative for abdominal pain, nausea and vomiting.    OBJECTIVE:   Temp:  [98.5 F (36.9 C)-98.7 F (37.1 C)] 98.5 F (36.9 C) (10/22 1118) Pulse Rate:  [44-65] 50 (10/22 1118) Resp:  [12-19] 15 (10/22 1118) BP: (133-174)/(78-108) 154/108 (10/22 1118) SpO2:  [98 %-100 %] 100 % (10/22 1118) Weight:  [81.2 kg] 81.2 kg (10/21 1625)   Physical Exam Constitutional:      General: She is not in acute distress.    Appearance: She is not ill-appearing, toxic-appearing or diaphoretic.  Cardiovascular:     Rate and Rhythm: Normal rate and regular rhythm.  Pulmonary:     Effort: No respiratory distress.     Breath sounds: Normal breath sounds.  Abdominal:     General: Bowel sounds are normal. There is no distension.     Palpations: Abdomen  is soft.     Tenderness: There is no abdominal tenderness. There is no guarding.  Neurological:     Mental Status: She is alert.     Labs: Recent Labs    07/28/24 1653 07/29/24 0023 07/29/24 0431  WBC 8.1  --  8.5  HGB 11.9* 11.6* 12.1  HCT  39.5 37.0 40.1  PLT 207  --  207   BMET Recent Labs    07/28/24 1653 07/29/24 0431  NA 143 144  K 3.8 3.6  CL 105 106  CO2 26 27  GLUCOSE 128* 88  BUN 22 14  CREATININE 1.08* 1.02*  CALCIUM  9.6 9.5   LFT Recent Labs    07/29/24 0431  PROT 6.8  ALBUMIN 4.0  AST 22  ALT 14  ALKPHOS 82  BILITOT 0.5   PT/INR Recent Labs    07/28/24 2142  LABPROT 13.6  INR 1.0   Diagnostic imaging: No results found.  IMPRESSION: Hematochezia, suspect post polypectomy bleeding (report from colonoscopy 07/21/2024 not available for my review) Normocytic anemia, stable  PLAN: -Had in-depth discussion with patient at bedside, strongly recommended she stay in hospital to complete colonoscopy given my concern for post polypectomy bleeding, she is not agreeable to completing bowel preparation while in the emergency department, uncertain when she would have inpatient room, she does not want to take another day off of work -She wants to be discharged from hospital, she was given strict instructions to return to hospital if she has any recurrence of bleeding -She was also recommended to re-contact her primary GI provider about her rectal bleeding -Will communicate above with IM team   LOS: 0 days   Estefana Keas, [facility]astroenterology

## 2024-07-29 NOTE — Discharge Instructions (Signed)
 Follow-up with GI provider at [facility] clinic in 1-2 weeks for post colonoscopy bleeding.

## 2024-07-29 NOTE — ED Notes (Signed)
 Admitting provider discussed  with patient d/c from ED.  RN awaiting d/c order

## 2024-07-31 ENCOUNTER — Other Ambulatory Visit: Payer: Self-pay

## 2024-07-31 ENCOUNTER — Encounter (HOSPITAL_COMMUNITY): Payer: Self-pay | Admitting: Emergency Medicine

## 2024-07-31 ENCOUNTER — Emergency Department (HOSPITAL_COMMUNITY)

## 2024-07-31 ENCOUNTER — Emergency Department (HOSPITAL_COMMUNITY)
Admission: EM | Admit: 2024-07-31 | Discharge: 2024-07-31 | Disposition: A | Discharge status: Home / Self Care | Source: Ambulatory Visit | Attending: Emergency Medicine | Admitting: Emergency Medicine

## 2024-07-31 DIAGNOSIS — K625 Hemorrhage of anus and rectum: Secondary | ICD-10-CM

## 2024-07-31 DIAGNOSIS — K644 Residual hemorrhoidal skin tags: Secondary | ICD-10-CM | POA: Insufficient documentation

## 2024-07-31 LAB — CBC
HCT: 39.8 % (ref 36.0–46.0)
Hemoglobin: 12 g/dL (ref 12.0–15.0)
MCH: 28.5 pg (ref 26.0–34.0)
MCHC: 30.2 g/dL (ref 30.0–36.0)
MCV: 94.5 fL (ref 80.0–100.0)
Platelets: 204 K/uL (ref 150–400)
RBC: 4.21 MIL/uL (ref 3.87–5.11)
RDW: 13.3 % (ref 11.5–15.5)
WBC: 6.1 K/uL (ref 4.0–10.5)
nRBC: 0 % (ref 0.0–0.2)

## 2024-07-31 LAB — COMPREHENSIVE METABOLIC PANEL WITH GFR
ALT: 14 U/L (ref 0–44)
AST: 25 U/L (ref 15–41)
Albumin: 4 g/dL (ref 3.5–5.0)
Alkaline Phosphatase: 95 U/L (ref 38–126)
Anion gap: 10 (ref 5–15)
BUN: 12 mg/dL (ref 8–23)
CO2: 26 mmol/L (ref 22–32)
Calcium: 9.7 mg/dL (ref 8.9–10.3)
Chloride: 107 mmol/L (ref 98–111)
Creatinine, Ser: 1.3 mg/dL — ABNORMAL HIGH (ref 0.44–1.00)
GFR, Estimated: 45 mL/min — ABNORMAL LOW (ref >60)
Glucose, Bld: 108 mg/dL — ABNORMAL HIGH (ref 70–99)
Potassium: 3.6 mmol/L (ref 3.5–5.1)
Sodium: 143 mmol/L (ref 135–145)
Total Bilirubin: 0.3 mg/dL (ref 0.0–1.2)
Total Protein: 7 g/dL (ref 6.5–8.1)

## 2024-07-31 LAB — TYPE AND SCREEN
ABO/RH(D): O POS
ABO/RH(D): O POS
Antibody Screen: NEGATIVE
Antibody Screen: NEGATIVE

## 2024-07-31 MED ORDER — IOHEXOL 350 MG/ML SOLN
80.0000 mL | Freq: Once | INTRAVENOUS | Status: AC | PRN
  Administered 2024-07-31: 80 mL via INTRAVENOUS

## 2024-07-31 MED ORDER — GLYCERIN (ADULT) 2 G RE SUPP: 1.0000 | RECTAL | 0 refills | Status: AC | PRN

## 2024-07-31 MED ORDER — POLYETHYLENE GLYCOL 3350 17 GM/SCOOP PO POWD: 17.0000 g | Freq: Every day | ORAL | 0 refills | Status: AC

## 2024-07-31 NOTE — Discharge Instructions (Signed)
 You have been seen and discharged from the emergency department.  I consulted with the on-call physician for Dr. Dorsey.  Spoke with Dr. Ernesto.  we believe your bleeding is from an internal hemorrhoid, not from the polypectomy.  They have asked for a strict bowel regimen with MiraLAX and suppositories and will follow-up with you in the office next week.  You may experience some ongoing bleeding over the weekend.  Follow-up with your primary provider for further evaluation and further care. Take home medications as prescribed. If you have any worsening symptoms, severe abdominal pain, chest pain, shortness of breath, lightheadedness or further concerns for your health please return to an emergency department for further evaluation.

## 2024-07-31 NOTE — ED Triage Notes (Signed)
 Pt reports bright red blood in her stool. Pt reports she was seen in ER for the same on the 21st but the bleeding stopped while here. Pt reports she had polyps removed on the 14th. Pt told to come back to the ER if she noticed anymore bleeding.

## 2024-07-31 NOTE — ED Provider Notes (Signed)
 [facility] DE[facility]MENT AT [facility] Provider Note   CSN: [id number] Arrival date & time: 07/31/24  9085     Patient presents with: Rectal Bleeding   Tara Holland is a 63 y.o. female.   HPI   63 year old female presents emergency department with rectal bleeding.  Patient had a colonoscopy on 10/14 with Dr. Dorsey at [facility]  Following this she had rectal bleeding that was evaluated in this ER.  There was reportedly a polyp removed on that colonoscopy and there was concern for post polypectomy bleed.  Plan was to be admitted for emergent colonoscopy with our gastroenterology team however the bleeding stopped and she left the hospital.  She has had no bleeding for 2 days and presents today with recurrent bleeding.  It is described as bright red blood, some clots.  Only when she is sitting on the toilet and bearing down.  She denies any symptoms of acute anemia, is not on anticoagulation.  Prior to Admission medications   Medication Sig Start Date End Date Taking? Authorizing Provider  amLODipine -benazepril  (LOTREL) 10-20 MG capsule Take 1 capsule by mouth daily. 01/18/23 07/28/24  Joesph Shaver Scales, PA-C    Allergies: Sulfa antibiotics, Sulfonamide derivatives, Sucralfate , Wound dressing adhesive, and Wound dressings    Review of Systems  Constitutional:  Negative for fever.  Respiratory:  Negative for shortness of breath.   Cardiovascular:  Negative for chest pain.  Gastrointestinal:  Positive for anal bleeding and blood in stool. Negative for abdominal pain, diarrhea and vomiting.  Skin:  Negative for rash.  Neurological:  Negative for headaches.    Updated Vital Signs BP 133/88 (BP Location: Left Arm)   Pulse (!) 53   Temp 98.5 F (36.9 C) (Oral)   Resp 16   SpO2 100%   Physical Exam Vitals and nursing note reviewed.  Constitutional:      Appearance: Normal appearance.  HENT:     Head: Normocephalic.     Mouth/Throat:     Mouth:  Mucous membranes are moist.  Cardiovascular:     Rate and Rhythm: Normal rate.  Pulmonary:     Effort: Pulmonary effort is normal. No respiratory distress.  Abdominal:     General: There is no distension.     Palpations: Abdomen is soft.     Tenderness: There is no abdominal tenderness. There is no guarding.  Genitourinary:    Comments: External hemorrhoid, small amount of bright red blood on rectal Skin:    General: Skin is warm.  Neurological:     Mental Status: She is alert and oriented to person, place, and time. Mental status is at baseline.  Psychiatric:        Mood and Affect: Mood normal.     (all labs ordered are listed, but only abnormal results are displayed) Labs Reviewed  COMPREHENSIVE METABOLIC PANEL WITH GFR - Abnormal; Notable for the following components:      Result Value   Glucose, Bld 108 (*)    Creatinine, Ser 1.30 (*)    GFR, Estimated 45 (*)    All other components within normal limits  CBC  POC OCCULT BLOOD, ED  TYPE AND SCREEN    EKG: None  Radiology: No results found.   Procedures   Medications Ordered in the ED - No data to display  Medical Decision Making Amount and/or Complexity of Data Reviewed Labs: ordered. Radiology: ordered.  Risk OTC drugs. Prescription drug management.   63 year old female who is status post colonoscopy on the 10/14 with polypectomy presents emergency department with rectal bleeding.  Was seen 2 days ago with similar complaints, initial plan to admit for repeat colonoscopy but the bleeding stopped.  Patient is been doing well for 2 days until today.  Now describing bright red blood and clots per rectum.  Vitals normal and stable.  She is not on any anticoagulation.  Blood work is reassuring, hemoglobin is stable.  Rectal exam shows external hemorrhoid, small amount of bright red blood.  CT angio is negative.  Consulted with on-call physician for gastroenterologist Dr.  Dorsey.  Spoke with Dr. Ernesto.  She reviewed her colonoscopy report.  She doubts that this bleeding is from the polyp and most likely from an internal hemorrhoid that was noted on the colonoscopy.  They have asked for a strict bowel regimen, avoid constipation along with suppositories.  They will follow-up with her in the office next week.  Discussed with patient the plan.  Discussed with her symptoms of anemia to return for.  She understands.  Vitals are stable and she is very well-appearing.  Tolerating p.o.  No ongoing bleeding.  Patient at this time appears safe and stable for discharge and close outpatient follow up. Discharge plan and strict return to ED precautions discussed, patient verbalizes understanding and agreement.     Final diagnoses:  None    ED Discharge Orders     None          Bari Roxie HERO, DO 07/31/24 1646

## 2024-08-10 ENCOUNTER — Encounter: Payer: Self-pay | Admitting: Radiology

## 2024-10-23 ENCOUNTER — Ambulatory Visit (HOSPITAL_BASED_OUTPATIENT_CLINIC_OR_DEPARTMENT_OTHER): Admitting: Orthopaedic Surgery
